# Patient Record
Sex: Male | Born: 1940 | Race: White | Hispanic: No | Marital: Married | Smoking: Former smoker
Health system: Southern US, Community
[De-identification: ages and names within clinical notes are randomized; demographics above are authoritative.]

## PROBLEM LIST (undated history)

## (undated) DIAGNOSIS — R413 Other amnesia: Secondary | ICD-10-CM

## (undated) DIAGNOSIS — I639 Cerebral infarction, unspecified: Secondary | ICD-10-CM

## (undated) DIAGNOSIS — F0390 Unspecified dementia without behavioral disturbance: Secondary | ICD-10-CM

## (undated) DIAGNOSIS — K219 Gastro-esophageal reflux disease without esophagitis: Secondary | ICD-10-CM

## (undated) DIAGNOSIS — I10 Essential (primary) hypertension: Secondary | ICD-10-CM

## (undated) DIAGNOSIS — M549 Dorsalgia, unspecified: Secondary | ICD-10-CM

## (undated) DIAGNOSIS — E291 Testicular hypofunction: Secondary | ICD-10-CM

## (undated) DIAGNOSIS — J449 Chronic obstructive pulmonary disease, unspecified: Secondary | ICD-10-CM

## (undated) DIAGNOSIS — E785 Hyperlipidemia, unspecified: Secondary | ICD-10-CM

## (undated) DIAGNOSIS — N189 Chronic kidney disease, unspecified: Secondary | ICD-10-CM

## (undated) DIAGNOSIS — Z720 Tobacco use: Secondary | ICD-10-CM

## (undated) HISTORY — DX: Gastro-esophageal reflux disease without esophagitis: K21.9

## (undated) HISTORY — PX: CARPAL TUNNEL RELEASE: SHX101

## (undated) HISTORY — DX: Testicular hypofunction: E29.1

## (undated) HISTORY — PX: OTHER SURGICAL HISTORY: SHX169

## (undated) HISTORY — DX: Other amnesia: R41.3

## (undated) HISTORY — DX: Essential (primary) hypertension: I10

## (undated) HISTORY — DX: Hyperlipidemia, unspecified: E78.5

## (undated) HISTORY — PX: BACK SURGERY: SHX140

## (undated) HISTORY — DX: Dorsalgia, unspecified: M54.9

## (undated) HISTORY — DX: Chronic kidney disease, unspecified: N18.9

---

## 2007-01-20 ENCOUNTER — Ambulatory Visit: Payer: Self-pay | Admitting: Orthopedic Surgery

## 2007-02-03 ENCOUNTER — Ambulatory Visit (HOSPITAL_COMMUNITY): Admission: RE | Admit: 2007-02-03 | Discharge: 2007-02-03 | Payer: Self-pay | Admitting: *Deleted

## 2007-03-15 ENCOUNTER — Encounter: Payer: Self-pay | Admitting: Orthopedic Surgery

## 2007-08-23 ENCOUNTER — Ambulatory Visit: Payer: Self-pay | Admitting: Orthopedic Surgery

## 2008-01-18 ENCOUNTER — Ambulatory Visit (HOSPITAL_COMMUNITY): Admission: RE | Admit: 2008-01-18 | Discharge: 2008-01-18 | Payer: Self-pay | Admitting: Internal Medicine

## 2008-02-16 ENCOUNTER — Ambulatory Visit (HOSPITAL_COMMUNITY): Admission: RE | Admit: 2008-02-16 | Discharge: 2008-02-16 | Payer: Self-pay | Admitting: Family Medicine

## 2008-03-14 ENCOUNTER — Ambulatory Visit: Payer: Self-pay | Admitting: Orthopedic Surgery

## 2008-03-14 DIAGNOSIS — M19019 Primary osteoarthritis, unspecified shoulder: Secondary | ICD-10-CM | POA: Insufficient documentation

## 2008-03-14 DIAGNOSIS — M7580 Other shoulder lesions, unspecified shoulder: Secondary | ICD-10-CM

## 2008-03-14 DIAGNOSIS — M75120 Complete rotator cuff tear or rupture of unspecified shoulder, not specified as traumatic: Secondary | ICD-10-CM

## 2008-03-14 DIAGNOSIS — M25519 Pain in unspecified shoulder: Secondary | ICD-10-CM | POA: Insufficient documentation

## 2008-07-16 ENCOUNTER — Ambulatory Visit: Payer: Self-pay | Admitting: Orthopedic Surgery

## 2008-08-15 ENCOUNTER — Ambulatory Visit (HOSPITAL_COMMUNITY): Admission: RE | Admit: 2008-08-15 | Discharge: 2008-08-15 | Payer: Self-pay | Admitting: General Surgery

## 2008-08-15 ENCOUNTER — Encounter (INDEPENDENT_AMBULATORY_CARE_PROVIDER_SITE_OTHER): Payer: Self-pay | Admitting: General Surgery

## 2009-03-04 ENCOUNTER — Ambulatory Visit (HOSPITAL_COMMUNITY): Admission: RE | Admit: 2009-03-04 | Discharge: 2009-03-04 | Payer: Self-pay | Admitting: Internal Medicine

## 2009-03-07 ENCOUNTER — Encounter (INDEPENDENT_AMBULATORY_CARE_PROVIDER_SITE_OTHER): Payer: Self-pay | Admitting: *Deleted

## 2009-04-08 ENCOUNTER — Encounter: Payer: Self-pay | Admitting: Gastroenterology

## 2009-05-14 ENCOUNTER — Ambulatory Visit: Payer: Self-pay | Admitting: Internal Medicine

## 2009-05-14 DIAGNOSIS — R198 Other specified symptoms and signs involving the digestive system and abdomen: Secondary | ICD-10-CM

## 2009-05-14 DIAGNOSIS — Z860100 Personal history of colon polyps, unspecified: Secondary | ICD-10-CM | POA: Insufficient documentation

## 2009-05-14 DIAGNOSIS — K5900 Constipation, unspecified: Secondary | ICD-10-CM | POA: Insufficient documentation

## 2009-05-15 ENCOUNTER — Encounter: Payer: Self-pay | Admitting: Internal Medicine

## 2009-05-20 ENCOUNTER — Encounter: Payer: Self-pay | Admitting: Internal Medicine

## 2009-06-07 ENCOUNTER — Ambulatory Visit: Payer: Self-pay | Admitting: Internal Medicine

## 2009-06-07 ENCOUNTER — Ambulatory Visit (HOSPITAL_COMMUNITY): Admission: RE | Admit: 2009-06-07 | Discharge: 2009-06-07 | Payer: Self-pay | Admitting: Internal Medicine

## 2009-09-10 ENCOUNTER — Ambulatory Visit (HOSPITAL_COMMUNITY): Admission: RE | Admit: 2009-09-10 | Discharge: 2009-09-10 | Payer: Self-pay | Admitting: Neurosurgery

## 2011-01-15 ENCOUNTER — Ambulatory Visit (HOSPITAL_COMMUNITY)
Admission: RE | Admit: 2011-01-15 | Discharge: 2011-01-15 | Payer: Self-pay | Source: Home / Self Care | Attending: Neurosurgery | Admitting: Neurosurgery

## 2011-05-12 NOTE — H&P (Signed)
NAMECRECENCIO, CUPPETT               ACCOUNT NO.:  [id number]   MEDICAL RECORD NO.:  [id number]          PATIENT TYPE:  AMB   LOCATION:  DAY                           FACILITY:  [facility]   PHYSICIAN:  Chelsea Primus, MD      DATE OF BIRTH:  Dec 28, 1941   DATE OF ADMISSION:  DATE OF DISCHARGE:  LH                              HISTORY & PHYSICAL   CHIEF COMPLAINT:  Lump on back.   HISTORY OF PRESENT ILLNESS:  The patient is a 70 year old male with a  history of a nodule on his back.  He states this had been previously  operated on in the past as an outpatient procedure in an office visit at  another surgeon's office.  From the description, it sounds like this was  incised and drained at that time, although the patient does state he was  told this was a definitive care treatment for this.  Since that time, he  has noted a recurrence of it, slowly increasing in size.  He has had no  episodes of pain.  No discharge.  No overlying erythema.  No fever or  chills.  No similar nodularities anywhere else on his body.   PAST MEDICAL HISTORY:  1. Hypertension.  2. Hypercholesterolemia.   PAST SURGICAL HISTORY:  He has had cervical neck surgery and lower back  surgery.   MEDICATIONS:  1. Lisinopril.  2. Hydrochlorothiazide.  3. Aspirin.  4. Simvastatin.   ALLERGIES:  No known drug allergies.   SOCIAL HISTORY:  He is a 1.5 pack per day smoker.  Alcohol 1-2 drinks  per week.  No recreational drug use.  Occupation, he is retired.   PERTINENT FAMILY HISTORY:  Noncontributory.   REVIEW OF SYSTEMS:  CONSTITUTIONAL:  Unremarkable.  EYES:  Unremarkable.  EARS, NOSE, AND THROAT:  Unremarkable.  RESPIRATORY:  Occasional  shortness of breath and wheezing.  CARDIOVASCULAR:  Unremarkable.  GASTROINTESTINAL:  Abdominal pain and heartburn.  GENITOURINARY:  Unremarkable.  MUSCULOSKELETAL:  Arthralgias of the joint.  SKIN:  Unremarkable.  NEURO:  Unremarkable.  ENDOCRINE:  Unremarkable.   PHYSICAL  EXAMINATION:  GENERAL:  The patient is healthy, calm in  appearance.  HEENT:  Scalp, no deformities, no masses.  Eyes:  Pupils are equal,  round, reactive.  Extraocular movements are intact.  No conjunctival  pallor is noted.  Oral mucosa is pink.  Normal occlusion.  NECK:  Trachea is midline.  No cervical lymphadenopathy is apparent.  PULMONARY:  Unlabored respiration.  No wheezes on evaluation today.  No  crackles.  He is clear to auscultation bilaterally.  CARDIOVASCULAR:  Regular rate and rhythm.  There is 2+ radial pulses  bilaterally.  ABDOMEN:  Soft, positive bowel sounds, nontender.  SKIN:  Warm and dry.  On evaluation of his back, he does have a mobile,  soft, nontender approximate 2-cm nodule on the midportion of his back.  There is an overlying scar consistent with the patient's description of  his previous procedure.   ASSESSMENT/PLAN:  Sebaceous cyst of the back.  At this point, I did  discuss with the patient  the pathophysiology of cysts and requirement to  remove the entire cyst.  I discussed with the patient the need to do  this in the operating room.  The risks, benefits, and alternatives that  consist excision were discussed at length with the patient including,  but not limited to the risk of bleeding, infection, or recurrence.  The  patient's questions and concerns were addressed and the patient will be  consented for the planned procedure.      Chelsea Primus, MD  Electronically Signed     BZ/MEDQ  D:  07/31/2008  T:  08/01/2008  Job:  [id number]   cc:   Sherrilee Gilles. Gerarda Fraction, MD  Fax: [facility]  Fax: [phone]

## 2011-05-12 NOTE — Op Note (Signed)
Glenn [name], Glenn [name]               ACCOUNT NO.:  [id number]   MEDICAL RECORD NO.:  [id number]          PATIENT TYPE:  AMB   LOCATION:  DAY                           FACILITY:  [facility]   PHYSICIAN:  Chelsea Primus, MD      DATE OF BIRTH:  1941/04/19   DATE OF PROCEDURE:  08/15/2008  DATE OF DISCHARGE:                               OPERATIVE REPORT   PREOPERATIVE DIAGNOSIS:  Sebaceous cyst of the back.   POSTOPERATIVE DIAGNOSIS:  Sebaceous cyst of the back.   PROCEDURE:  Excision of sebaceous cyst of the back via 2-cm incision.   SURGEON:  Chelsea Primus, MD   ANESTHESIA:  IV sedation MAC with local anesthetic 1% Sensorcaine with  epinephrine.   SPECIMEN:  Cyst.   ESTIMATED BLOOD LOSS:  Scant.   INDICATIONS:  The patient is a 70 year old male who presents today to my  office as an outpatient with notable nodule on his back.  He states this  had been previously operated on.  From his description, it found like he  had had a previous incision and drainage of the cyst.  At this time, he  still has some local discomfort and occasional pressure in the area.  He  had no episodes of recent infection associated with it.  Risks,  benefits, and alternatives of excision were discussed at length with the  patient including but not limited to risk of bleeding, infection, or  recurrence.  The patient's questions and concerns were addressed and the  patient wished to proceed with excision of the cyst.   OPERATION:  The patient was taken to the operating room, he was placed  in the left lateral decubitus position on a beanbag support, and was  given the MAC sedation.  Once the patient was asleep, his back was  prepped with DuraPrep solution and drapes were placed in a standard  fashion.  Local anesthetic was instilled.  An elliptical incision was  created over the cyst and this was dissected down.  The subcutaneous  tissue was carried out using a needle-tipped electrocautery.  At this  point, the  dissection was carried out down to the underlying fascia.  At  which time, the cyst was undermined and was removed from the surrounding  tissue.  It was placed on the back table and was sent as a permanent  specimen to pathology.  Hemostasis was obtained using electrocautery.  The wound was irrigated.  Deep submucosal tissue was reapproximated  using a 3-0 Vicryl in a deep interrupted fashion and a 3-0 Prolene was  utilized to reapproximate the skin edges.  In a similar fashion, the  skin was washed and dried with moist and dry towel.  Sterile 4x4  dressing was placed and Tegaderm dressing with Xeroform placed.  The  drapes were  removed and the patient was allowed to come out of sedation and was  transferred back to regular hospital bed and transferred to  [facility] in stable condition.  At the conclusion of  procedure, all instruments, sponge, and needle counts were correct.  The  patient tolerated  the procedure well.      Chelsea Primus, MD  Electronically Signed     BZ/MEDQ  D:  08/15/2008  T:  08/16/2008  Job:  [id number]

## 2011-05-12 NOTE — Op Note (Signed)
Glenn [name], Glenn [name]               ACCOUNT NO.:  [id number]   MEDICAL RECORD NO.:  [id number]          PATIENT TYPE:  AMB   LOCATION:  DAY                           FACILITY:  [facility]   PHYSICIAN:  R. Garfield Cornea, M.D. DATE OF BIRTH:  01/10/1941   DATE OF PROCEDURE:  06/07/2009  DATE OF DISCHARGE:                               OPERATIVE REPORT   PROCEDURE PERFORMED:  Surveillance colonoscopy.   INDICATIONS FOR PROCEDURE:  A 70 year old gentleman who reportedly had  numerous colonic polyps removed from his colon at [facility] 5 years  ago.  He is here for surveillance.  His only GI symptom is constipation.  There was sketchy history of colitis when he was referred but he  adamantly denies any history of inflammatory bowel disease or colitis.  He does not have diarrhea and does not have any blood per rectum.  No  abdominal pain.  Colonoscopy is now being done as a surveillance  maneuver.  Risks, benefits, alternatives and limitations have been  reviewed, questions answered.  Please see documentation in the medical  record.   PROCEDURE NOTE:  O2 saturation, blood pressure, pulse and respirations  were monitored throughout the entire procedure.  Conscious sedation was  Versed 3 mg IV, Demerol 50 mg IV in divided doses.   INSTRUMENT USED:  Pentax video chip system.   FINDINGS:  Digital rectal exam revealed no abnormalities.  The prep was  marginal.  Colon:  Colonic mucosa was surveyed from the rectosigmoid junction  through the left, transverse and right colon into the area of  appendiceal orifice, ileocecal valve and cecum.  These structures were  well seen and photographed for the record.  From this level, scope was  slowly cautiously withdrawn.  Previously mentioned mucosal surfaces were  again seen.  There was granular stool and vegetable material throughout  the colon which had to be washed and suctioned out to gain a better  view.  The patient was noted to have shallow  scattered left-sided  diverticula.  Remainder of colonic mucosa appeared normal.  Scope was  pulled down in the rectum where thorough examination of rectal mucosa  including retroflex view of the anal verge demonstrated no  abnormalities.  The patient tolerated procedure well.  Cecal withdrawal  time 8 minutes.   IMPRESSION:  Normal rectum, few shallow left-sided diverticula.  Remainder of colonic mucosa appeared normal.   RECOMMENDATIONS:  1. Diverticulosis literature provided to [name].  2. Recommend repeat colonoscopy in 5 years.      Bridgette Habermann, M.D.  Electronically Signed     RMR/MEDQ  D:  06/07/2009  T:  06/07/2009  Job:  [id number]   cc:   Sherrilee Gilles. Gerarda Fraction, MD  Fax: [phone]

## 2011-09-15 ENCOUNTER — Other Ambulatory Visit: Payer: Self-pay | Admitting: Neurology

## 2011-09-15 DIAGNOSIS — R410 Disorientation, unspecified: Secondary | ICD-10-CM

## 2011-09-18 ENCOUNTER — Ambulatory Visit (HOSPITAL_COMMUNITY): Payer: Medicare Other

## 2011-09-18 ENCOUNTER — Ambulatory Visit (HOSPITAL_COMMUNITY)
Admission: RE | Admit: 2011-09-18 | Discharge: 2011-09-18 | Disposition: A | Discharge status: Home / Self Care | Payer: Medicare Other | Source: Ambulatory Visit | Attending: Neurology | Admitting: Neurology

## 2011-09-18 DIAGNOSIS — R410 Disorientation, unspecified: Secondary | ICD-10-CM

## 2011-09-18 DIAGNOSIS — R4182 Altered mental status, unspecified: Secondary | ICD-10-CM | POA: Insufficient documentation

## 2011-10-16 ENCOUNTER — Ambulatory Visit: Payer: Medicare Other | Attending: Neurology | Admitting: Sleep Medicine

## 2011-10-16 DIAGNOSIS — G4730 Sleep apnea, unspecified: Secondary | ICD-10-CM

## 2011-10-16 DIAGNOSIS — Z681 Body mass index (BMI) 19 or less, adult: Secondary | ICD-10-CM | POA: Insufficient documentation

## 2011-10-16 DIAGNOSIS — G4733 Obstructive sleep apnea (adult) (pediatric): Secondary | ICD-10-CM | POA: Insufficient documentation

## 2011-10-16 DIAGNOSIS — G4710 Hypersomnia, unspecified: Secondary | ICD-10-CM | POA: Insufficient documentation

## 2011-10-18 NOTE — Procedures (Signed)
NAMEEBRAHIM, Glenn [name]               ACCOUNT NO.:  [id number]  MEDICAL RECORD NO.:  [id number]          PATIENT TYPE:  OUT  LOCATION:  SLEEP LAB                     FACILITY:  [facility]  PHYSICIAN:  Jaeson Molstad A. Merlene Laughter, M.D. DATE OF BIRTH:  11-30-1941  DATE OF STUDY:  10/16/2011                           NOCTURNAL POLYSOMNOGRAM  REFERRING PHYSICIAN:  Paige Monarrez A. Merlene Laughter, M.D.  INDICATIONS:  This 70 year old man who presents with hypersomnia, fatigue, snoring and insomnia.  The study is being done to evaluate for obstructive sleep apnea syndrome.  INDICATION FOR STUDY:  EPWORTH SLEEPINESS SCORE:  MEDICATIONS:  Claritin, Ambien, hydrocodone, hydrochlorothiazide, Nexium, lisinopril, aspirin, simvastatin and Depakote.  EPWORTH SLEEPINESS SCALE:  12.  BMI:  18.  ARCHITECTURAL SUMMARY:  The total recording time is 399 minutes.  Sleep efficiency 69%, sleep latency 46 minutes and REM latency quite early at 28 minutes.  Stage N1 14%, N2 63%, N3 30% and REM sleep 10%.  RISK FACTORS:  Baseline oxygen saturation is 95, lowest saturation 88 during REM sleep.  Diagnostic AHI is 11 and RDI 12.  LIMB MOVEMENT SUMMARY:  PLM index is recorded as 3.  However, there is quite severe phasic EMG activity/fragmentary myoclonus noted throughout the recording, including REM sleep.  ELECTROCARDIOGRAM SUMMARY:  Average heart rate is 87 with no significant dysrhythmias observed.  IMPRESSION: 1. Mild obstructive sleep apnea syndrome. 2. Severe phasic EMG activity/fragmentary myoclonus activity.  This is     observed even in REM sleep suggestive of REM sleep behavior     disorder 3. 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    Lesieli Bresee A. Merlene Laughter, M.D.    KAD/MEDQ  D:  10/17/2011 [id number]  T:  10/18/2011 03:06:52  Job:  [id number]

## 2011-11-23 ENCOUNTER — Other Ambulatory Visit (HOSPITAL_COMMUNITY): Payer: Self-pay | Admitting: Nephrology

## 2011-11-23 DIAGNOSIS — N289 Disorder of kidney and ureter, unspecified: Secondary | ICD-10-CM

## 2011-11-26 ENCOUNTER — Ambulatory Visit (HOSPITAL_COMMUNITY)
Admission: RE | Admit: 2011-11-26 | Discharge: 2011-11-26 | Disposition: A | Discharge status: Home / Self Care | Payer: Medicare Other | Source: Ambulatory Visit | Attending: Nephrology | Admitting: Nephrology

## 2011-11-26 DIAGNOSIS — N289 Disorder of kidney and ureter, unspecified: Secondary | ICD-10-CM | POA: Insufficient documentation

## 2011-11-26 DIAGNOSIS — Q619 Cystic kidney disease, unspecified: Secondary | ICD-10-CM | POA: Insufficient documentation

## 2011-11-27 ENCOUNTER — Ambulatory Visit (HOSPITAL_COMMUNITY)
Admission: RE | Admit: 2011-11-27 | Discharge: 2011-11-27 | Disposition: A | Discharge status: Home / Self Care | Payer: Medicare Other | Source: Ambulatory Visit | Attending: Internal Medicine | Admitting: Internal Medicine

## 2011-11-27 ENCOUNTER — Other Ambulatory Visit (HOSPITAL_COMMUNITY): Payer: Self-pay | Admitting: Internal Medicine

## 2011-11-27 DIAGNOSIS — R05 Cough: Secondary | ICD-10-CM

## 2011-11-27 DIAGNOSIS — R059 Cough, unspecified: Secondary | ICD-10-CM | POA: Insufficient documentation

## 2011-11-30 DIAGNOSIS — R413 Other amnesia: Secondary | ICD-10-CM | POA: Diagnosis present

## 2012-01-25 DIAGNOSIS — I10 Essential (primary) hypertension: Secondary | ICD-10-CM | POA: Diagnosis not present

## 2012-01-25 DIAGNOSIS — Z79899 Other long term (current) drug therapy: Secondary | ICD-10-CM | POA: Diagnosis not present

## 2012-01-25 DIAGNOSIS — R413 Other amnesia: Secondary | ICD-10-CM | POA: Diagnosis not present

## 2012-01-26 ENCOUNTER — Encounter (HOSPITAL_COMMUNITY): Payer: Self-pay | Admitting: Psychiatry

## 2012-01-26 ENCOUNTER — Ambulatory Visit (INDEPENDENT_AMBULATORY_CARE_PROVIDER_SITE_OTHER): Payer: Medicare Other | Admitting: Psychiatry

## 2012-01-26 VITALS — Wt 158.0 lb

## 2012-01-26 DIAGNOSIS — F39 Unspecified mood [affective] disorder: Secondary | ICD-10-CM

## 2012-01-26 DIAGNOSIS — F0630 Mood disorder due to known physiological condition, unspecified: Secondary | ICD-10-CM

## 2012-01-26 NOTE — Progress Notes (Signed)
Chief complaint I have memory issues  History of presenting illness Patient is 71 year old married retired male who came with his wife for the appointment. 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 He was referred from neurologist Dr. Paulita Cradle for evaluation and treatment. Apparently patient endorsed suicidal thinking and ideation in August last year due to the fact that he cannot tolerate his wife's sister in his house. Patient told his wife sister decided to move in with them and his wife decided to help her out. She stayed almost 4 months and patient reported those 4 months were very difficult for him. He could not tolerate her behavior and relationship with their children. Since she moved out patient reported he is doing better. However patient's wife reported that patient has been more irritable angry frustrated and having mood swing earlier than that. Patient wife endorse that patient has significant personality changes with agitation forgetting things with angry episodes. Patient endorse suicidal thinking her primary care physician recommended to see neurologist to see if patient is suffering from neurological disease. Patient has extensive workup including EEG CT scan and MRI of brain. He was ruled out for Huntington's disease. As per patient's wife imaging study shows old stroke which happened in 2002. Even though patient's wife's sister moved out patient continues to have moments of frustration anger and agitation. He tends to forget things. During the conversation patient and his wife continue to argue about the events which patient has been forgetting recently. Patient and his wife also marital issues and recently thinks seriously to get some marital counseling. Patient also remember forgetting directions telephone numbers recently. Patient also endorse at times poor sleep and racing thoughts. Patient denies any active or even passive suicidal thoughts in recent months. He denies any paranoia or  violent episode. He does admit sometimes seeing shadows at the corner of his eyes but denies any hallucination. He admitted getting easily frustrated when things are not done or if he does not remember very well. Recently neurologist started him on Depakote which was gradually increased to 1000 mg. As per wife and patient increased Depakote is helping to control his mood sleep and anger. Patient did not bring the list of medication today.  Past psychiatric history Patient denies any history of previous psychiatric inpatient treatment or any suicidal attempt. He denies any history of paranoid thinking delusions or violent behavior. He do not remember taking any psychotropic medication other than Ambien which is given by his primary care physician and he's been taking for past 3 years.  Medical history Patient has history of hypertension. His primary care physician is Dr. Gerarda Fraction. Recently he has see neurologist an extensive workup done including B12 level.  Psychosocial history Patient is born and raised in [place]. This is his third marriage. His first marriage lasted for 5 years. He has 2 sons from his first marriage however patient has no contact with his first wife and children. Patient's second marriage last only for one year. This is his third marriage and patient has 3 children from his third wife. Patient is eating-year-old son and 50 year old son and 82 year old son. Patient denies any history of sexual verbal or emotional abuse.  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  Family history She denies any family history of psychiatric illness.  Educational background Patient has high school education.  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 He admitted having DWI in in past. He also endorse history of blackout due to heavy drinking. He denies any recent use of alcohol. He  denies any history of illegal substances.  Current medication Patient do not have the details of his medication.  Mental status examination Patient is elderly man who is casually dressed and fairly groomed. He appears very restless and anxious. He keep changing his posture. He maintained fair eye contact. He is cooperative but at times irrelevant in conversation. His attention and concentration was distracted. His speech is at times rambling and incoherent. He denies any active or passive suicidal thoughts or homicidal thoughts. There no psychotic symptoms present. There no delusions or paranoid thinking present. Denies any auditory or visual hallucination. He has difficulty recalling recent events. His thought processes circumstantial. There were no tremors however he was restless during the conversation. He was alert and oriented x3 but he missed to things in his serial 7. His insi. His i insight judgment and impulse control is fair  Diagnoses Axis I mood disorder NOS rule out mood disorder due to general medical condition Axis II deferred Axis III see medical history Axis IV moderate Axis V 65-70  Plan At this time patient is doing better with Depakote. He is reporting no side effects of medication. He is sleeping better and his behavior is much control. I do believe patient needs psychological testing to help the diagnosis of his memory changes which so for not resolved by extensive neurology workup. I will also need his current medication and collateral information from his neurologist. I recommended to see psychologist for neuropsych testing and also for individual counseling to increase his coping and social skills. I explained the risks and benefits of Depakote including metabolic side effects. We also talked about safety plan that in case patient feel worsening of her symptoms or patient or his wife feels patient having any suicidal thoughts and homicidal thoughts and he need to call 911 or  go to local ER. I will see him again in 3 weeks. No prescription given on this visit. Patient will make appointment to see psychologist

## 2012-01-29 DIAGNOSIS — I10 Essential (primary) hypertension: Secondary | ICD-10-CM | POA: Diagnosis not present

## 2012-01-29 DIAGNOSIS — J449 Chronic obstructive pulmonary disease, unspecified: Secondary | ICD-10-CM | POA: Diagnosis not present

## 2012-02-09 ENCOUNTER — Ambulatory Visit (INDEPENDENT_AMBULATORY_CARE_PROVIDER_SITE_OTHER): Payer: Medicare Other | Admitting: Psychology

## 2012-02-09 DIAGNOSIS — F0630 Mood disorder due to known physiological condition, unspecified: Secondary | ICD-10-CM

## 2012-02-09 DIAGNOSIS — R413 Other amnesia: Secondary | ICD-10-CM

## 2012-02-16 ENCOUNTER — Ambulatory Visit (INDEPENDENT_AMBULATORY_CARE_PROVIDER_SITE_OTHER): Payer: Medicare Other | Admitting: Psychiatry

## 2012-02-16 ENCOUNTER — Encounter (HOSPITAL_COMMUNITY): Payer: Self-pay | Admitting: Psychiatry

## 2012-02-16 DIAGNOSIS — Z79899 Other long term (current) drug therapy: Secondary | ICD-10-CM | POA: Diagnosis not present

## 2012-02-16 DIAGNOSIS — F39 Unspecified mood [affective] disorder: Secondary | ICD-10-CM

## 2012-02-16 NOTE — Progress Notes (Signed)
Chief complaint I am doing better  History of presenting illness Patient is 71 year old married retired male who came with his wife for the followup appointment. Patient is also seen by psychologist in this office and psychological testing is schedule next week. As per wife patient has been doing somewhat better in his mood. He is sleeping better. He denies any agitation anger or severe mood swings however he continues to have episodes of confusion and frustration. He is taking Depakote thousand milligram at bedtime. Patient reported that he has sometime tremors and shakes but no other concerns or side effects. He is sleeping 6-7 hours. He denies any panic attack or severe anger.  I review his medication however these are recently changed from his primary care physician. He is still take Ambien 10 mg every night. He has no recent Depakote level drawn.   Past psychiatric history Patient denies any history of previous psychiatric inpatient treatment or any suicidal attempt. He denies any history of paranoid thinking delusions or violent behavior. He do not remember taking any psychotropic medication other than Ambien which is given by his primary care physician and he's been taking for past 3 years.  Medical history Patient has history of hypertension. His primary care physician is Dr. Gerarda Fraction. Recently he has see neurologist an extensive workup done including B12 level.  Psychosocial history Patient is born and raised in [place]. This is his third marriage. His first marriage lasted for 5 years. He has 2 sons from his first marriage however patient has no contact with his first wife and children. Patient's second marriage last only for one year. This is his third marriage and patient has 3 children from his third wife. Patient is eating-year-old son and 53 year old son and 77 year old son. Patient denies any history of sexual verbal or emotional abuse.  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  Family history She denies any family history of psychiatric illness.  Educational background Patient has high school education.  Alcohol and substance use history Patient endorsed history of heavy alcoholism when he was in [employer]. He admitted having DWI in in past. He also endorse history of blackout due to heavy drinking. He denies any recent use of alcohol. He denies any history of illegal substances.  Current medication Patient do not have the details of his medication.  Mental status examination Patient is elderly man who is casually dressed and fairly groomed. He appears much calmer and cooperative. His speech is fast but relevant. His attention and concentration remains distracted. He denies any active or passive suicidal thinking and homicidal thinking. He denies any auditory or visual hallucination. There were no delusion or paranoid thinking present. His thought processes circumstantial. There are fine tremors present. He keep changing his posture during the conversation. He's alert and oriented x3. His insight judgment and pulse control is fair   Diagnoses Axis I mood disorder NOS rule out mood disorder due to general medical condition Axis II deferred Axis III see medical history Axis IV moderate Axis V 65-70  Plan I talked to the patient and his wife in detail. I will consider getting Depakote level along with CBC CMP and hemoglobin A1c. His tremor could be the possibility of Depakote. I also recommended to take half Ambien at bedtime. For now he will continue Depakote thousand milligram. I explained risks and benefits of medication. He will continue to see psychologist and we will follow up on psychological  testing. I will see him in 4 weeks. Time spent 30 minutes

## 2012-02-17 DIAGNOSIS — Z79899 Other long term (current) drug therapy: Secondary | ICD-10-CM | POA: Diagnosis not present

## 2012-02-18 LAB — CBC WITH DIFFERENTIAL/PLATELET
Basophils Relative: 0 % (ref 0–1)
Eosinophils Relative: 2 % (ref 0–5)
HCT: 44.4 % (ref 39.0–52.0)
Hemoglobin: 14.6 g/dL (ref 13.0–17.0)
Lymphs Abs: 2.6 10*3/uL (ref 0.7–4.0)
MCH: 30.2 pg (ref 26.0–34.0)
MCV: 91.9 fL (ref 78.0–100.0)
Monocytes Absolute: 1.6 10*3/uL — ABNORMAL HIGH (ref 0.1–1.0)
Neutro Abs: 8.7 10*3/uL — ABNORMAL HIGH (ref 1.7–7.7)
Neutrophils Relative %: 66 % (ref 43–77)
RBC: 4.83 MIL/uL (ref 4.22–5.81)
RDW: 13.6 % (ref 11.5–15.5)

## 2012-02-18 LAB — COMPREHENSIVE METABOLIC PANEL
AST: 20 U/L (ref 0–37)
Albumin: 3.8 g/dL (ref 3.5–5.2)
Calcium: 9 mg/dL (ref 8.4–10.5)
Chloride: 104 mEq/L (ref 96–112)
Creat: 1.14 mg/dL (ref 0.50–1.35)
Potassium: 4.3 mEq/L (ref 3.5–5.3)

## 2012-02-18 LAB — HEMOGLOBIN A1C
Hgb A1c MFr Bld: 5.8 % — ABNORMAL HIGH (ref <5.7)
Mean Plasma Glucose: 120 mg/dL — ABNORMAL HIGH (ref <117)

## 2012-02-18 LAB — VALPROIC ACID LEVEL: Valproic Acid Lvl: 27.8 ug/mL — ABNORMAL LOW (ref 50.0–100.0)

## 2012-02-23 ENCOUNTER — Encounter (HOSPITAL_COMMUNITY): Payer: Self-pay | Admitting: Psychology

## 2012-02-23 NOTE — Progress Notes (Signed)
Patient:   Glenn [name]   DOB:   04/12/1941  MR Number:  [id number]  Location:  [facility] [address] [place] [address] Dept: [phone]           Date of Service:   02/09/2012  Start Time:   9:30 AM End Time:   10:30 AM  Provider/Observer:  Edgardo Roys PSYD       Billing Code/Service: [phone]  Chief Complaint:     Chief Complaint  Patient presents with  . Anxiety  . Depression  . Memory Loss    Reason for Service:  The patient was referred by his family members because of increasing concerns about the possibility of Alzheimer's or other lytic issues related to memory loss. The patient is in a general denial about the severity of the symptoms. The patient doesn't knowledge that he forgets a few things here and there and may have gotten turned around a few times and gotten lost driving but overall the patient reports he is doing well. However, the patient's wife reports that there are significant problems. In 2002 he had a stroke. The patient port said he woke up and felt numbness in his right arm. He was diagnosed with a transient ischemic attack. There is a strong family history of TIAs and high blood pressure the patient presented at the [facility] and they ran some tests and gave him some medications but did not feel that he had a major stroke. Since that time any of these symptoms have cleared up and he has had no apparent symptoms from this problem. However, the patient's wife reports that this past summer the patient would get very angry over the smallest of things. This anger and frustration also included suicidal ideation which is why he originally presented that the neurologist. The patient has an 71 year old, 71 year old, an 19 year old child. The patient has been more agitated and sharp with them and there are other times where he is described as having geographic  disorientation. The patient forgets what he is going to say and then experiences or displays other expressive language problems including circumlocutions, paraphrasic errors, as well as significant indications of combat he would show an without the appearance of knowledge of the inaccuracies of his statements.  Current Status:  The patient does present as a 71 year old married retired male who had been referred by his neurologist and initially saw Dr. Adele Schilder.  There've been some progressive deterioration in the patient's functioning. He has been displaying increased agitation anger and had a great deal of conflict with the patient's wife's sister who moved in and lived with them for a few months. The patient began having increasing conflicts and began developing suicidal ideation. Extensive neurological workup including EEG, CT scan, and MRI have been conducted and there's been a rule out of Huntington's disease (which there is a family history of) but that this imaging did show evidence of an old stroke that happened back in 2002. No other strokes or indications of strokes were noticed. The patient has been showing increasing expressive language difficulties, memory difficulties, geographic disorientation, and a progressive decline in overall functioning. The patient has also had episodes of what appear to be confabulations without apparent knowledge or understanding that these confabulations are in fact not true.  Reliability of Information: The information is provided by the patient as well as his wife who is also present for the clinical interview.  Behavioral Observation:  Glenn [name]  presents as a 71 y.o.-year-old Right Caucasian Male who appeared his stated age. his dress was Appropriate and he was Well Groomed and his manners were Appropriate to the situation.  There were not any physical disabilities noted.  he displayed an appropriate level of cooperation and motivation.    Interactions:        Active   Attention:   Indications of being easily distracted  Memory:   There were indications of memory difficulties particularly for recent information but no indication of long-term memory disturbance.   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  The patient doesn't knowledge heavy alcohol use when he was in the [employer]. He had a DWI in the past. He has also had a blackout 2 to heavy drinking. He denies any recent use of alcohol and denies any history of illegal substances.  Education:   The patient has a high school education  Medical History:   Past Medical History  Diagnosis Date  . HTN (hypertension)   . Memory changes         Outpatient Encounter Prescriptions as of 02/24/2012  Medication Sig Dispense Refill  . aspirin 81 MG tablet Take 81 mg by mouth daily.        . divalproex (DEPAKOTE ER) 500 MG 24 hr tablet Take 1,000 mg by mouth daily. Dr Ollen Barges      . esomeprazole (NEXIUM) 40 MG capsule Take 40 mg by mouth daily before breakfast.        . hydrochlorothiazide (HYDRODIURIL) 25 MG tablet Take 25 mg by mouth daily.        Marland Kitchen lisinopril (PRINIVIL,ZESTRIL) 40 MG tablet Take 40 mg by mouth daily. 1/2 tab       . loratadine (CLARITIN) 10 MG tablet Take 10 mg by mouth daily.        . simvastatin (ZOCOR) 80 MG tablet Take 80 mg by mouth at bedtime. 1/2 tab daily       . zolpidem (AMBIEN) 10 MG tablet Take 10 mg by mouth at bedtime as needed.                Sexual History:   History  Sexual  Activity  . Sexually Active: Not on file    Abuse/Trauma History: There is no indication of sexual abuse or other traumas.  Psychiatric History:  The patient denies any history of previous psychiatric inpatient hospitalization or other suicidal ideation or intent. He denies any history of paranoid or delusional thinking or violent behavior. He denies any history of psychotropic medications other than taking sleep medications included Ambien that were prescribed by his primary care physician and he has been taking this for the past 3 years.  Family Med/Psych History: No family history on file.  Risk of Suicide/Violence: moderate the patient did initially present to his primary care physician with thoughts of suicide and extreme anger. It was these presentations that led  him to our office in the first place. He denies any current intent or plan and denies any active suicidal ideation.  Impression/DX:  At this point, the patient does have a neurological history of a stroke back in 2002 this confirmed both by reports of symptoms at the time as well as a recent MRI of the brain. However, there was no indication of any successive strokes. The patient has shown significant improvement from the initial symptoms that developed after 2002. The presentation of changes does not appear to be stepwise but in fact a progressive deterioration. Geographic disorientation, short term and recent memory weaknesses, problems with expressive line which including circumlocutions and paraphrasic errors are noted. The patient and his family also reportedly still few instances of geographic disorientation. The possibility of an underlying progressive cortical dementia such as Alzheimer's remains present and we will conduct formal neuropsychological testing to objectively assess and document current cognitive and memory functions as well as establish a baseline for future comparisons.  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 Memory loss of unknown cause         Axis II: No diagnosis       Axis III:  See body of report for medical issues      Axis IV:  other psychosocial or environmental problems          Axis V:  51-60 moderate symptoms

## 2012-02-24 ENCOUNTER — Ambulatory Visit (INDEPENDENT_AMBULATORY_CARE_PROVIDER_SITE_OTHER): Payer: Medicare Other | Admitting: Psychology

## 2012-02-24 DIAGNOSIS — F0630 Mood disorder due to known physiological condition, unspecified: Secondary | ICD-10-CM

## 2012-02-24 DIAGNOSIS — R413 Other amnesia: Secondary | ICD-10-CM | POA: Diagnosis not present

## 2012-02-25 ENCOUNTER — Encounter (HOSPITAL_COMMUNITY): Payer: Self-pay | Admitting: Psychology

## 2012-02-25 NOTE — Progress Notes (Signed)
GENERAL INTELLECTUAL FUNCTIONING:  The patient was initially administered the CERAD Neuropsychological Battery.  Below are the patient's scores in relationship to normative data and mild/moderate Senile Dementia of the Alzheimer's Type.      VF Name MMSE [facility] W[facility] Patient:  11 15 25 8 13 2 8   Normal Age Matched 18 14.6 28.9 10.1 21.1 7.2 9.6  Mild SDAT:  8.8 11.8 20 7.8 8.8 0.9 4.8   Moderate SDAT 6.8 10.5 16.1 6.5 6.3 0.4 3.9   On the CERAD battery, which is a battery of measures shown to be sensitive to various forms of dementia, the patient's performance was mildly impaired and below the normative expectations but just above the group of Alzheimer's dementia.  Behavioral observations indicate that the patient fully in gauge during the testing procedure but was somewhat anxious about doing well and quickly came up with excuses if he felt he had done poorly on a particular task. This is consistent with his complaints and reports of difficulties with anxiety in general.  His performance on the Mini-Mental State Exam indicates mild difficulties with time as he felt that the day of the week was Tuesday when in fact it was Wednesday but was correct as to year, season, and date. He was not able to recall my name or the name of the facility he was sent.Marland Kitchen  He was able to immediately recall 3 objects and was able to recall those 3 objects after a brief delay. He was able to spell world forwards and also able to spell "world" backwards.  There were some indications of visual spatial difficulties noted. He was able to carry out a brief series of verbal instructions.  His performance on the constructional praxis test, a test sensitive to cortically based neurological deficits, was  mildly impaired and should some difficulties with more complex planning and visual spatial abilities.  The patient's performance on the Verbal Fluency Test was mildly impaired and consistent with the mild Alzheimer's  population group.  His ability to name pictures of common and uncommon objects as measured by the [facility] Naming Test was within normal limits and suggests good targeted naming and daily naming abilities.   The patient's verbal learning and ability to encode new information was mildly impaired and below age matched normative population groups and just above those levels achieved by individuals in the mild Alzheimer's comparison group.  When words were initially presented over three separate trials, his immediate recall was mildly impaired as he recalled 13 of the possible 30 words.  He was only able to recall 2 of the original 10 words after a brief delay, which is significantly impaired and consistent with the mild Alzheimer's comparison group.  When the task was changed to a recognition task, he correctly identified 8 of the original 0 words and correctly identified 9 of the 10 words that were not on the original list.  This pattern suggests mild difficulties in coding and storing not a tour information but does suggest that queuing helps with memory recall.  Overall, this performance indicates that the patient is consistently performing below age matched normative expectations but consistently slightly above the mild Alzheimer's population on almost all measures with the exception of targeted and daily mean abilities. The patient showed some difficulties with verbal fluency, learning new information, or complicated visual spatial abilities and in particular free recall of newly lower in information. Cuing and targeted recall did appear to be better. Most of these performances were  more consistent with a mild Alzheimer's population group and they were within normative comparison group.  Summery and Conclusions:   The results of the current neuropsychological assessment which administered a broad range of various cognitive testing to be most sensitive to the early cognitive changes produced with a  cortical dementia were generally consistent with some very early signs and mild symptoms related to conditions such as Alzheimer's dementia. However, the patient's performance were not quite to the level of this mild Alzheimer's population and therefore the results suggesting this possibility should be interpreted with caution. It is clear that the patient performed below expected levels based on normative values which is clear to suggest that there are some mild cognitive deficits. However, the patient has a history of anxiety and it is clear that he was very anxious and this testing procedure. It is possible that anxiety at a significant level could produce some mild cognitive weaknesses but I do not think he could explain the totality of this performance. We will need to conduct repeat assessment in 6-12 months to assess for progressive nature to these changes and to gain more confidence in the diagnostic suggestions. While the patient did show mild cognitive deficits these were not to the level that would limit his ability to make valid and competent decisions about his basic life. I do encourage the patient and his family to work on some long-term planning at this point while there are no significant pain clearly disruptive deficits at this point. We will reassess the patient in 6-12 months to assess for progressive decline.

## 2012-03-04 ENCOUNTER — Ambulatory Visit (INDEPENDENT_AMBULATORY_CARE_PROVIDER_SITE_OTHER): Payer: Medicare Other | Admitting: Psychology

## 2012-03-04 DIAGNOSIS — F419 Anxiety disorder, unspecified: Secondary | ICD-10-CM

## 2012-03-04 DIAGNOSIS — F0281 Dementia in other diseases classified elsewhere with behavioral disturbance: Secondary | ICD-10-CM

## 2012-03-04 DIAGNOSIS — F411 Generalized anxiety disorder: Secondary | ICD-10-CM

## 2012-03-04 DIAGNOSIS — F0280 Dementia in other diseases classified elsewhere without behavioral disturbance: Secondary | ICD-10-CM | POA: Diagnosis not present

## 2012-03-04 DIAGNOSIS — F068 Other specified mental disorders due to known physiological condition: Secondary | ICD-10-CM

## 2012-03-04 DIAGNOSIS — G309 Alzheimer's disease, unspecified: Secondary | ICD-10-CM

## 2012-03-08 ENCOUNTER — Encounter (HOSPITAL_COMMUNITY): Payer: Self-pay | Admitting: Psychology

## 2012-03-08 ENCOUNTER — Ambulatory Visit (INDEPENDENT_AMBULATORY_CARE_PROVIDER_SITE_OTHER): Payer: Medicare Other | Admitting: Psychiatry

## 2012-03-08 ENCOUNTER — Encounter (HOSPITAL_COMMUNITY): Payer: Self-pay | Admitting: Psychiatry

## 2012-03-08 VITALS — Wt 160.8 lb

## 2012-03-08 DIAGNOSIS — Z79899 Other long term (current) drug therapy: Secondary | ICD-10-CM

## 2012-03-08 NOTE — Patient Instructions (Signed)
Take Depakote 500 mg in the morning and thousand milligram at bedtime for 2 weeks and then get Depakote level. Followup in 2 weeks after the blood work.

## 2012-03-08 NOTE — Progress Notes (Signed)
Today I provided feedback regarding the results of the neuropsychological assessment. These formal reports were interpreted and written out in the February 27 clinical note. The results of the current neuropsychological assessment are most consistent with findings of early stage or mild levels of Alzheimer's. While the patient has a history of a localized stroke in the past he does appear to be recovered from most of that. The pattern of cognitive deterioration is not consistent with a stepwise pattern but more of a gradual and progressive decline. These findings are most consistent with Alzheimer's condition. The patient has a long history of underlying anxiety disorder and this condition does appear to be exacerbating these features. Geographic disorientation, learning and memory dysfunction, expressive language difficulties, and overall executive functioning difficulties including verbal fluency are all noted. However, the features were very mild and more consistent with the early stages of Alzheimer's but we will need repeat assessment in 6-9 months to document progressive changes. In any event, I do think that the most salient feature of the testing as one of features consistent with mild or early stage Alzheimer's.

## 2012-03-08 NOTE — Progress Notes (Signed)
Chief complaint Medication management and followup   History of presenting illness Patient is 71 year old married retired male who came with his wife for the followup appointment. As per wife patient continues to have episodic agitation anger and mood swings. During the session he was notice easily upset irritable with his wife. Patient admitted having some anger and agitation when things are not going very well. He endorse marital issues and complex family problems. He admitted sleep is fine but he also feel very anxious and nervous at home. He denies any tremors or shakes. He is taking Depakote 500 mg in the morning and 500 mg at bedtime. I review his blood work which shows Depakote level is 27. He has WBC count of 13.3, glucose 106 however his LFT was within normal limits. His hemoglobin A1c is 5.8. He also bring list of medication which I reviewed. He has recently seen Dr. Paulita Cradle recommended Exelon patch however patient refused to take it. I also reviewed reports from psychologist and neuropsych testing which shows patient has cognitive problem with anxiety.  Current psychiatric medication Depakote 500 mg twice a day Ambien 10 mg as needed His psychiatric medication is prescribed by neurologist.  Past psychiatric history Patient denies any history of previous psychiatric inpatient treatment or any suicidal attempt. He denies any history of paranoid thinking delusions or violent behavior. He do not remember taking any psychotropic medication other than Ambien which is given by his primary care physician and he's been taking for past 3 years.  Medical history Patient has history of hypertension. His primary care physician is Dr. Gerarda Fraction. Recently he has see neurologist an extensive workup done including B12 level.  Psychosocial history Patient is born and raised in [place]. This is his third marriage. His first marriage lasted for 5 years. He has 2 sons from his first marriage however  patient has no contact with his first wife and children. Patient's second marriage last only for one year. This is his third marriage and patient has 3 children from his third wife. Patient is eating-year-old son and 51 year old son and 7 year old son. Patient denies any history of sexual verbal or emotional abuse.  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  Family history She denies any family history of psychiatric illness.  Educational background Patient has high school education.  Alcohol and substance use history Patient endorsed history of heavy alcoholism when he was in [employer]. He admitted having DWI in in past. He also endorse history of blackout due to heavy drinking. He denies any recent use of alcohol. He denies any history of illegal substances.  Mental status examination Patient is elderly man who is casually dressed and fairly groomed. He he is easily irritable and frustrated. He continues to argue with his wife when she tried to explain his behavior. His attention and concentration is poor. He denies any active or passive suicidal thinking and homicidal thinking. He denies any auditory or visual hallucination. He described his mood okay and his affect is labile. He has some difficulty recalling events. He's alert and oriented x3. His insight judgment and impulse control is fair.  Diagnoses Axis I mood disorder NOS rule out mood disorder due to general medical condition Axis II deferred Axis III see medical history Axis IV moderate Axis V 65-70  Plan I reviewed lab tests, psychological testing, medication, and collateral information. I do believe patient need higher dose of Depakote since his level is low.  I also recommended to see counselor for family and marriage counseling since this has been a stressor in his life. I will repeat Depakote level one more time once he is taking Depakote 1500 mg daily. He is getting his  medication from neurologist and reported he has enough refills. I have explained risks and benefits of medication in detail. At this time he has no side effects including any tremors or shakes. I will see him again in 2-3 weeks. Time spent 30 minutes

## 2012-03-14 ENCOUNTER — Other Ambulatory Visit (HOSPITAL_COMMUNITY): Payer: Self-pay | Admitting: Psychiatry

## 2012-03-14 ENCOUNTER — Telehealth (HOSPITAL_COMMUNITY): Payer: Self-pay | Admitting: *Deleted

## 2012-03-14 MED ORDER — DIVALPROEX SODIUM ER 500 MG PO TB24: ORAL_TABLET | ORAL | Status: DC

## 2012-03-21 DIAGNOSIS — Z79899 Other long term (current) drug therapy: Secondary | ICD-10-CM | POA: Diagnosis not present

## 2012-03-22 ENCOUNTER — Encounter (HOSPITAL_COMMUNITY): Payer: Self-pay | Admitting: Psychiatry

## 2012-03-22 ENCOUNTER — Ambulatory Visit (INDEPENDENT_AMBULATORY_CARE_PROVIDER_SITE_OTHER): Payer: Medicare Other | Admitting: Psychiatry

## 2012-03-22 VITALS — Wt 163.0 lb

## 2012-03-22 DIAGNOSIS — F39 Unspecified mood [affective] disorder: Secondary | ICD-10-CM

## 2012-03-22 MED ORDER — DIVALPROEX SODIUM ER 500 MG PO TB24: ORAL_TABLET | ORAL | Status: DC

## 2012-03-22 NOTE — Progress Notes (Signed)
Chief complaint Medication management and followup   History of presenting illness Patient is 71 year old married retired male who came with his wife for the followup appointment. As per wife patient is doing much better with increased Depakote.  He is less irritable and less agitated.  He is sleeping fine and reported no side effects.  He denies any tremors shakes or any concern about medication.  He appears much calmer and relaxed.  He sleeps fine however he still requires Ambien at night.  He had blood work done yesterday and the results are still pending.  He is scheduled to see his neurologist tomorrow, as per wife patient may start a new medication to improve his memory from his neurologist.  Current psychiatric medication Depakote 500 mg one in the morning and 2 at bedtime.   Ambien 10 mg as needed prescribed by his neurologist.  Past psychiatric history Patient denies any history of previous psychiatric inpatient treatment or any suicidal attempt. He denies any history of paranoid thinking delusions or violent behavior. He do not remember taking any psychotropic medication other than Ambien which is given by his primary care physician and he's been taking for past 3 years.  Medical history Patient has history of hypertension. His primary care physician is Dr. Gerarda Fraction. Recently he has see neurologist an extensive workup done including B12 level.  Psychosocial history Patient is born and raised in [place]. This is his third marriage. His first marriage lasted for 5 years. He has 2 sons from his first marriage however patient has no contact with his first wife and children. Patient's second marriage last only for one year. This is his third marriage and patient has 3 children from his third wife. Patient is eating-year-old son and 58 year old son and 33 year old son. Patient denies any history of sexual verbal or emotional abuse.  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  Family history She denies any family history of psychiatric illness.  Educational background Patient has high school education.  Alcohol and substance use history Patient endorsed history of heavy alcoholism when he was in [employer]. He admitted having DWI in in past. He also endorse history of blackout due to heavy drinking. He denies any recent use of alcohol. He denies any history of illegal substances.  Mental status examination Patient is elderly man who is casually dressed and fairly groomed. He he is much calmer and pleasant.  He maintained good eye contact.  His speech is clear and coherent.  His thought process is slow but logical linear and goal-directed.  He denies any active or passive suicidal thinking and homicidal thinking.  His attention and concentration is fair.  He denies any auditory or visual hallucination.  He described his mood is okay and his affect is mood congruent.  There no tremors or shakes present.  He's alert and oriented x3.  His insight judgment and impulse control is okay.  Diagnoses Axis I mood disorder NOS rule out mood disorder due to general medical condition Axis II deferred Axis III see medical history Axis IV moderate Axis V 65-70  Plan At this time patient is fairly stable on increase Depakote.  He had blood work done however is also still pending.  I will continue his current dose of Depakote.  I have explained risks and benefits of medication in detail.  I will also recommend to see therapist regularly for increase coping and social skills.  I will  see him again in 6 weeks.  We will discuss the blood results on his next appointment.

## 2012-03-23 ENCOUNTER — Telehealth (HOSPITAL_COMMUNITY): Payer: Self-pay | Admitting: *Deleted

## 2012-03-23 DIAGNOSIS — R413 Other amnesia: Secondary | ICD-10-CM | POA: Diagnosis not present

## 2012-03-23 DIAGNOSIS — I10 Essential (primary) hypertension: Secondary | ICD-10-CM | POA: Diagnosis not present

## 2012-03-23 LAB — VALPROIC ACID LEVEL: Valproic Acid Lvl: 79.7 ug/mL (ref 50.0–100.0)

## 2012-05-03 ENCOUNTER — Ambulatory Visit (INDEPENDENT_AMBULATORY_CARE_PROVIDER_SITE_OTHER): Payer: Medicare Other | Admitting: Psychiatry

## 2012-05-03 ENCOUNTER — Encounter (HOSPITAL_COMMUNITY): Payer: Self-pay | Admitting: Psychiatry

## 2012-05-03 VITALS — Wt 165.0 lb

## 2012-05-03 DIAGNOSIS — F39 Unspecified mood [affective] disorder: Secondary | ICD-10-CM

## 2012-05-03 MED ORDER — DIVALPROEX SODIUM ER 500 MG PO TB24: ORAL_TABLET | ORAL | Status: DC

## 2012-05-03 NOTE — Progress Notes (Signed)
05/03/2012.  Chief complaint I still have issues with my wife.   History of presenting illness Patient is 71 year old married retired male who came with his wife for the followup appointment. As per wife patient is doing better in his agitation anger with increased Depakote however he continues to have issues in argument in his daily life.  Patient admitted having issues with her life about dealing with children and discipline them.  Wife endorse that patient gets very upset if she tries to discipline her children.  Patient endorse that he is sleeping better and denies any recent aggression or violent behavior but feel that he needs marriage counseling.  Patient's wife also agreed with that.  Patient recently seen the neurologist and find out that he was already taking Aricept from him for his memory problem.  No new medication was added by a neurologist.  Patient brought list of medication and it was noted that he is also taking pain medication and muscle relaxants.  Patient denies any side effects of medication.  He denies any tremors or shakes.  He sleeps fine and denies any active or passive suicidal thoughts.  His last Depakote level is 74.  His appetite and weight is stable.  Current psychiatric medication Depakote 500 mg one in the morning and 2 at bedtime.   Ambien 10 mg as needed prescribed by his neurologist.  Past psychiatric history Patient denies any history of previous psychiatric inpatient treatment or any suicidal attempt. He denies any history of paranoid thinking delusions or violent behavior. He do not remember taking any psychotropic medication other than Ambien which is given by his primary care physician and he's been taking for past 3 years.  Medical history Patient has history of hypertension, hyperlipidemia, chronic back pain and memory issues. His primary care physician is Dr. Gerarda Fraction.  His neurologist is Dr. Paulita Cradle.    Psychosocial history Patient is born and raised in  [place]. This is his third marriage. His first marriage lasted for 5 years. He has 2 sons from his first marriage however patient has no contact with his first wife and children. Patient's second marriage last only for one year. This is his third marriage and patient has 3 children from his third wife. Patient is eating-year-old son and 29 year old son and 65 year old son. Patient denies any history of sexual verbal or emotional abuse.  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  Family history She denies any family history of psychiatric illness.  Educational background Patient has high school education.  Alcohol and substance use history Patient endorsed history of heavy alcoholism when he was in [employer]. He admitted having DWI in in past. He also endorse history of blackout due to heavy drinking. He denies any recent use of alcohol. He denies any history of illegal substances.  Mental status examination Patient is elderly man who is casually dressed and fairly groomed. He he is calm, cooperative and pleasant.  He gets irritable when wife is talking about marital and family issues but he maintained good eye contact.  His speech is clear and coherent.  His thought process is slow but logical linear and goal-directed.  He denies any active or passive suicidal thinking and homicidal thinking.  His attention and concentration is fair.  He denies any auditory or visual hallucination.  He described his mood is okay and his affect is mood congruent.  There no tremors or shakes present.  He's alert and  oriented x3.  His insight judgment and impulse control is okay.  Diagnoses Axis I mood disorder NOS rule out mood disorder due to general medical condition Axis II deferred Axis III see medical history Axis IV moderate Axis V 65-70  Plan I reviewed his uptake medication, uptake history and Depakote level.  Patient this time not reporting any side  effects however he continues to have marital and family issues.  I do believe patient will be benefit if he see therapist for coping and social skills with his wife.  In the past he has seen therapist in this office for testing but no more appointment was scheduled to .  I recommend to see therapist for increase coping and social skills with his wife.  Patient his wife agreed with the plan.  I will continue Depakote at this present does.  I also recommended try Ambien half tablet to avoid any problems and dependency issue.  I recommended to call us if he has any question about medication or if he feel worsening of the symptoms.  Time spent 30 minutes.  I will see him again in 2 months.

## 2012-05-04 ENCOUNTER — Ambulatory Visit (INDEPENDENT_AMBULATORY_CARE_PROVIDER_SITE_OTHER): Payer: Medicare Other | Admitting: Psychology

## 2012-05-04 DIAGNOSIS — F068 Other specified mental disorders due to known physiological condition: Secondary | ICD-10-CM

## 2012-05-04 DIAGNOSIS — F419 Anxiety disorder, unspecified: Secondary | ICD-10-CM

## 2012-05-04 DIAGNOSIS — G309 Alzheimer's disease, unspecified: Secondary | ICD-10-CM

## 2012-05-04 DIAGNOSIS — F0281 Dementia in other diseases classified elsewhere with behavioral disturbance: Secondary | ICD-10-CM | POA: Diagnosis not present

## 2012-05-04 DIAGNOSIS — F411 Generalized anxiety disorder: Secondary | ICD-10-CM

## 2012-05-04 DIAGNOSIS — F0280 Dementia in other diseases classified elsewhere without behavioral disturbance: Secondary | ICD-10-CM | POA: Diagnosis not present

## 2012-05-05 DIAGNOSIS — E559 Vitamin D deficiency, unspecified: Secondary | ICD-10-CM | POA: Diagnosis not present

## 2012-05-05 DIAGNOSIS — D649 Anemia, unspecified: Secondary | ICD-10-CM | POA: Diagnosis not present

## 2012-05-05 DIAGNOSIS — I10 Essential (primary) hypertension: Secondary | ICD-10-CM | POA: Diagnosis not present

## 2012-05-05 DIAGNOSIS — N189 Chronic kidney disease, unspecified: Secondary | ICD-10-CM | POA: Diagnosis not present

## 2012-05-05 DIAGNOSIS — Z79899 Other long term (current) drug therapy: Secondary | ICD-10-CM | POA: Diagnosis not present

## 2012-05-11 DIAGNOSIS — E875 Hyperkalemia: Secondary | ICD-10-CM | POA: Diagnosis not present

## 2012-05-11 DIAGNOSIS — R809 Proteinuria, unspecified: Secondary | ICD-10-CM | POA: Diagnosis not present

## 2012-05-11 DIAGNOSIS — I10 Essential (primary) hypertension: Secondary | ICD-10-CM | POA: Diagnosis not present

## 2012-05-11 DIAGNOSIS — N182 Chronic kidney disease, stage 2 (mild): Secondary | ICD-10-CM | POA: Diagnosis not present

## 2012-05-13 ENCOUNTER — Telehealth (HOSPITAL_COMMUNITY): Payer: Self-pay | Admitting: *Deleted

## 2012-05-17 DIAGNOSIS — J449 Chronic obstructive pulmonary disease, unspecified: Secondary | ICD-10-CM | POA: Diagnosis not present

## 2012-05-17 DIAGNOSIS — E785 Hyperlipidemia, unspecified: Secondary | ICD-10-CM | POA: Diagnosis not present

## 2012-05-17 DIAGNOSIS — I10 Essential (primary) hypertension: Secondary | ICD-10-CM | POA: Diagnosis not present

## 2012-05-17 DIAGNOSIS — R5381 Other malaise: Secondary | ICD-10-CM | POA: Diagnosis not present

## 2012-05-17 DIAGNOSIS — F528 Other sexual dysfunction not due to a substance or known physiological condition: Secondary | ICD-10-CM | POA: Diagnosis not present

## 2012-05-19 ENCOUNTER — Other Ambulatory Visit (HOSPITAL_COMMUNITY): Payer: Self-pay | Admitting: *Deleted

## 2012-05-19 ENCOUNTER — Other Ambulatory Visit (HOSPITAL_COMMUNITY): Payer: Self-pay | Admitting: Psychiatry

## 2012-05-19 DIAGNOSIS — F39 Unspecified mood [affective] disorder: Secondary | ICD-10-CM

## 2012-05-19 NOTE — Telephone Encounter (Signed)
Given prescription on 07/03/12 with one additional refill. Too soon to refill.

## 2012-05-20 ENCOUNTER — Ambulatory Visit (INDEPENDENT_AMBULATORY_CARE_PROVIDER_SITE_OTHER): Payer: Medicare Other | Admitting: Psychology

## 2012-05-20 ENCOUNTER — Encounter (HOSPITAL_COMMUNITY): Payer: Self-pay | Admitting: Psychology

## 2012-05-20 DIAGNOSIS — F068 Other specified mental disorders due to known physiological condition: Secondary | ICD-10-CM | POA: Diagnosis not present

## 2012-05-20 DIAGNOSIS — F419 Anxiety disorder, unspecified: Secondary | ICD-10-CM

## 2012-05-20 DIAGNOSIS — F411 Generalized anxiety disorder: Secondary | ICD-10-CM | POA: Diagnosis not present

## 2012-05-20 DIAGNOSIS — F0281 Dementia in other diseases classified elsewhere with behavioral disturbance: Secondary | ICD-10-CM | POA: Diagnosis not present

## 2012-05-20 DIAGNOSIS — F0280 Dementia in other diseases classified elsewhere without behavioral disturbance: Secondary | ICD-10-CM

## 2012-05-20 DIAGNOSIS — G309 Alzheimer's disease, unspecified: Secondary | ICD-10-CM | POA: Diagnosis not present

## 2012-05-20 NOTE — Progress Notes (Signed)
Patient:  Glenn [name]   DOB: Jul 12, 1941  MR Number: [id number]  Location: [facility] [address] [place] [address] Dept: [phone]  Start: 11 AM End: 12 AM  Provider/Observer:     Edgardo Roys PSYD  Chief Complaint:      Chief Complaint  Patient presents with  . Stress  . Anxiety  . Agitation    Reason For Service:     The patient was referred by his family members because of increasing concerns about the possibility of Alzheimer's or other lytic issues related to memory loss. The patient is in a general denial about the severity of the symptoms. The patient doesn't knowledge that he forgets a few things here and there and may have gotten turned around a few times and gotten lost driving but overall the patient reports he is doing well. However, the patient's wife reports that there are significant problems. In 2002 he had a stroke. The patient port said he woke up and felt numbness in his right arm. He was diagnosed with a transient ischemic attack. There is a strong family history of TIAs and high blood pressure the patient presented at the [facility] and they ran some tests and gave him some medications but did not feel that he had a major stroke. Since that time any of these symptoms have cleared up and he has had no apparent symptoms from this problem. However, the patient's wife reports that this past summer the patient would get very angry over the smallest of things. This anger and frustration also included suicidal ideation which is why he originally presented that the neurologist. The patient has an 71 year old, 71 year old, an 71 year old child. The patient has been more agitated and sharp with them and there are other times where he is described as having geographic disorientation. The patient forgets what he is going to say and then experiences or displays other  expressive language problems including circumlocutions, paraphrasic errors, as well as significant indications of combat he would show an without the appearance of knowledge of the inaccuracies of his statements   Interventions Strategy:  Cognitive/behavioral psychotherapeutic interventions along with working with the patient and his wife.  Participation Level:   Active  Participation Quality:  Appropriate      Behavioral Observation:  Well Groomed, Alert, and Labile.   Current Psychosocial Factors: The patient and his wife are having significant problems that go beyond the issues of his progressive cognitive changes. The patient has a long history of anxiety disorder and obsessive-compulsive traits as well as significant marital conflict. The patient is long since not only allow forced his wife to do everything with her children and be responsible for all of the parenting this has created some real dynamic issues between he and his children as well as his wife. As his concreteness inability to truly cope with and year with abstract interpersonal issues versus these conflicts have become more pronounced.  Content of Session:   Reviewed current symptoms and worked on both interventions the patient as well as his wife can do to deal with these ongoing conflict.  Current Status:   While the patient has clearly shown some progressive memory and cognitive declines he remains capable of learning new information and remembering multiple aspects of his life. However, the changes particularly in personality that are developing are creating reviving significant long-term conflicts within the family.  Patient Progress:   Stable  Target  Goals:   Target goals include improving the patient's coping skills but more importantly the dynamics between he and his family including he and his wife as well as his children. Specific advice has been given with regard to efforts with his wife.  Last  Reviewed:   05/04/2012  Goals Addressed Today:    Today we worked on more family dynamic issues and giving advice to the patient's wife.  Impression/Diagnosis:   The results of the current neuropsychological assessment are most consistent with findings of early stage or mild levels of Alzheimer's. While the patient has a history of a localized stroke in the past he does appear to be recovered from most of that. The pattern of cognitive deterioration is not consistent with a stepwise pattern but more of a gradual and progressive decline. These findings are most consistent with Alzheimer's condition. The patient has a long history of underlying anxiety disorder and this condition does appear to be exacerbating these features. Geographic disorientation, learning and memory dysfunction, expressive language difficulties, and overall executive functioning difficulties including verbal fluency are all noted. However, the features were very mild and more consistent with the early stages of Alzheimer's but we will need repeat assessment in 6-9 months to document progressive changes. In any event, I do think that the most salient feature of the testing as one of features consistent with mild or early stage Alzheimer's.    Diagnosis:    Axis I:  1. Alzheimer's dementia with behavioral disturbance   2. Anxiety         Axis II: No diagnosis

## 2012-05-20 NOTE — Progress Notes (Signed)
Patient:  Glenn [name]   DOB: [birth year]  MR Number: [id number]  Location: [facility] [address] [place] [address] Dept: [phone]  Start: 11 AM End: 12 PM  Provider/Observer:     Edgardo Roys PSYD  Chief Complaint:      Chief Complaint  Patient presents with  . Memory Loss  . Agitation  . Anxiety    Reason For Service:     The patient was referred by his family members because of increasing concerns about the possibility of Alzheimer's or other lytic issues related to memory loss. The patient is in a general denial about the severity of the symptoms. The patient doesn't knowledge that he forgets a few things here and there and may have gotten turned around a few times and gotten lost driving but overall the patient reports he is doing well. However, the patient's wife reports that there are significant problems. In 2002 he had a stroke. The patient port said he woke up and felt numbness in his right arm. He was diagnosed with a transient ischemic attack. There is a strong family history of TIAs and high blood pressure the patient presented at the [facility] and they ran some tests and gave him some medications but did not feel that he had a major stroke. Since that time any of these symptoms have cleared up and he has had no apparent symptoms from this problem. However, the patient's wife reports that this past summer the patient would get very angry over the smallest of things. This anger and frustration also included suicidal ideation which is why he originally presented that the neurologist. The patient has an 70 year old, 71 year old, an 46 year old child. The patient has been more agitated and sharp with them and there are other times where he is described as having geographic disorientation. The patient forgets what he is going to say and then experiences or displays other  expressive language problems including circumlocutions, paraphrasic errors, as well as significant indications of combat he would show an without the appearance of knowledge of the inaccuracies of his statements   Interventions Strategy:  Cognitive/behavioral psychotherapeutic interventions along with working with the patient and his wife.  Participation Level:   Active  Participation Quality:  Appropriate      Behavioral Observation:  Well Groomed, Alert, and Labile.   Current Psychosocial Factors: The patient and his wife in particular his wife have been working on some of the therapeutic strategies we have been developing. However, there've been some major negative interactions between the patient and his 72 year old son. There is a great deal of resentment in issues of self-esteem and his son based on the dynamics between the patient and his son over the years.  Content of Session:   Reviewed current symptoms and worked on both interventions the patient as well as his wife can do to deal with these ongoing conflict.  Current Status:   While the patient has clearly shown some progressive memory and cognitive declines he remains capable of learning new information and remembering multiple aspects of his life. However, the changes particularly in personality that are developing are creating reviving significant long-term conflicts within the family.  Patient Progress:   Stable  Target Goals:   Target goals include improving the patient's coping skills but more importantly the dynamics between he and his family including he and his wife as well as his children. Specific advice has been given  with regard to efforts with his wife.  Last Reviewed:   05/04/2012  Goals Addressed Today:    Today we worked on more family dynamic issues and giving advice to the patient's wife.  Impression/Diagnosis:   The results of the current neuropsychological assessment are most consistent with findings of early  stage or mild levels of Alzheimer's. While the patient has a history of a localized stroke in the past he does appear to be recovered from most of that. The pattern of cognitive deterioration is not consistent with a stepwise pattern but more of a gradual and progressive decline. These findings are most consistent with Alzheimer's condition. The patient has a long history of underlying anxiety disorder and this condition does appear to be exacerbating these features. Geographic disorientation, learning and memory dysfunction, expressive language difficulties, and overall executive functioning difficulties including verbal fluency are all noted. However, the features were very mild and more consistent with the early stages of Alzheimer's but we will need repeat assessment in 6-9 months to document progressive changes. In any event, I do think that the most salient feature of the testing as one of features consistent with mild or early stage Alzheimer's.    Diagnosis:    Axis I:  1. Alzheimer's dementia with behavioral disturbance   2. Anxiety         Axis II: No diagnosis

## 2012-05-24 ENCOUNTER — Telehealth (HOSPITAL_COMMUNITY): Payer: Self-pay | Admitting: *Deleted

## 2012-06-07 ENCOUNTER — Telehealth (HOSPITAL_COMMUNITY): Payer: Self-pay | Admitting: *Deleted

## 2012-06-10 ENCOUNTER — Ambulatory Visit (INDEPENDENT_AMBULATORY_CARE_PROVIDER_SITE_OTHER): Payer: Medicare Other | Admitting: Psychology

## 2012-06-10 DIAGNOSIS — F0280 Dementia in other diseases classified elsewhere without behavioral disturbance: Secondary | ICD-10-CM | POA: Diagnosis not present

## 2012-06-10 DIAGNOSIS — F411 Generalized anxiety disorder: Secondary | ICD-10-CM

## 2012-06-10 DIAGNOSIS — F419 Anxiety disorder, unspecified: Secondary | ICD-10-CM

## 2012-06-10 DIAGNOSIS — G309 Alzheimer's disease, unspecified: Secondary | ICD-10-CM

## 2012-06-10 DIAGNOSIS — F0281 Dementia in other diseases classified elsewhere with behavioral disturbance: Secondary | ICD-10-CM | POA: Diagnosis not present

## 2012-06-20 ENCOUNTER — Telehealth (HOSPITAL_COMMUNITY): Payer: Self-pay | Admitting: *Deleted

## 2012-06-21 ENCOUNTER — Telehealth (HOSPITAL_COMMUNITY): Payer: Self-pay | Admitting: *Deleted

## 2012-06-28 ENCOUNTER — Ambulatory Visit (HOSPITAL_COMMUNITY): Payer: Self-pay | Admitting: Psychiatry

## 2012-07-05 ENCOUNTER — Ambulatory Visit (INDEPENDENT_AMBULATORY_CARE_PROVIDER_SITE_OTHER): Payer: Medicare Other | Admitting: Psychiatry

## 2012-07-05 ENCOUNTER — Encounter (HOSPITAL_COMMUNITY): Payer: Self-pay | Admitting: Psychiatry

## 2012-07-05 DIAGNOSIS — F39 Unspecified mood [affective] disorder: Secondary | ICD-10-CM

## 2012-07-05 MED ORDER — DIVALPROEX SODIUM ER 500 MG PO TB24: ORAL_TABLET | ORAL | Status: DC

## 2012-07-05 NOTE — Progress Notes (Signed)
Chief complaint I still have issues with my wife.   History of presenting illness Patient is 71 year old married retired male who came for his followup appointment.  Patient come by himself usually comes with his wife.  Patient told his wife was busy today and cannot come with him.  Patient continued to endorse family issues at home.  He is seeing therapist for coping skills however he feel his marriage did not last long due to significant marital issues.  Overall he feel less irritable less angry but admitted being more frustrated with his wife.  He is sleeping better from past but is still requires Ambien 10 mg on some nights.  She denies any side effects of medication including any tremors or shakes.  He likes Depakote which he's been taking as prescribed.  He denies any active or passive suicidal thoughts or homicidal thoughts.  He does not believe that he has a memory problem however he agrees that if other people are noticing than it could be true.  His last Depakote leve and March 2013 was 74.  His appetite and weight is stable.  Current psychiatric medication Depakote 500 mg one in the morning and 2 at bedtime.   Ambien 10 mg as needed prescribed by his neurologist.  Past psychiatric history Patient denies any history of previous psychiatric inpatient treatment or any suicidal attempt. He denies any history of paranoid thinking delusions or violent behavior. He do not remember taking any psychotropic medication other than Ambien which is given by his primary care physician and he's been taking for past 3 years.  Medical history Patient has history of hypertension, hyperlipidemia, chronic back pain and memory issues. His primary care physician is Dr. Gerarda Fraction.  His neurologist is Dr. Paulita Cradle.    Psychosocial history Patient is born and raised in [place]. This is his third marriage. His first marriage lasted for 5 years. He has 2 sons from his first marriage however patient has no contact  with his first wife and children. Patient's second marriage last only for one year. This is his third marriage and patient has 3 children from his third wife. Patient is eating-year-old son and 81 year old son and 50 year old son. Patient denies any history of sexual verbal or emotional abuse.  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  Family history She denies any family history of psychiatric illness.  Educational background Patient has high school education.  Alcohol and substance use history Patient endorsed history of heavy alcoholism when he was in [employer]. He admitted having DWI in in past. He also endorse history of blackout due to heavy drinking. He denies any recent use of alcohol. He denies any history of illegal substances.  Mental status examination Patient is elderly man who is casually dressed and fairly groomed. He he is calm, cooperative and pleasant.  He gets irritable when talk about his wife and family issues.  He maintains good eye contact.  His speech is clear and coherent.  His thought process is slow but logical linear and goal-directed.  He denies any active or passive suicidal thinking and homicidal thinking.  His attention and concentration is fair.  He denies any auditory or visual hallucination.  He described his mood is okay and his affect is mood congruent.  There no tremors or shakes present.  He's alert and oriented x3.  His insight judgment and impulse control is okay.  Diagnoses Axis I mood disorder NOS rule  out mood disorder due to general medical condition Axis II deferred Axis III see medical history Axis IV moderate Axis V 65-70  Plan I recommend to see a marriage counselor .  I will continue his Depakote .  At this time patient does not have any side effects.  I explained the risks and benefits of medication .  I will see him again in 2 months.  Portion of this note is generated with voice dictation  software and may contain typographical error.

## 2012-07-07 ENCOUNTER — Other Ambulatory Visit (HOSPITAL_COMMUNITY): Payer: Self-pay | Admitting: Physician Assistant

## 2012-07-07 DIAGNOSIS — J309 Allergic rhinitis, unspecified: Secondary | ICD-10-CM | POA: Diagnosis not present

## 2012-07-07 DIAGNOSIS — R109 Unspecified abdominal pain: Secondary | ICD-10-CM

## 2012-07-07 DIAGNOSIS — R195 Other fecal abnormalities: Secondary | ICD-10-CM | POA: Diagnosis not present

## 2012-07-07 DIAGNOSIS — IMO0002 Reserved for concepts with insufficient information to code with codable children: Secondary | ICD-10-CM | POA: Diagnosis not present

## 2012-07-08 DIAGNOSIS — R195 Other fecal abnormalities: Secondary | ICD-10-CM | POA: Diagnosis not present

## 2012-07-08 DIAGNOSIS — R109 Unspecified abdominal pain: Secondary | ICD-10-CM | POA: Diagnosis not present

## 2012-07-11 ENCOUNTER — Encounter (HOSPITAL_COMMUNITY): Payer: Self-pay

## 2012-07-11 ENCOUNTER — Ambulatory Visit (HOSPITAL_COMMUNITY)
Admission: RE | Admit: 2012-07-11 | Discharge: 2012-07-11 | Disposition: A | Discharge status: Home / Self Care | Payer: Medicare Other | Source: Ambulatory Visit | Attending: Physician Assistant | Admitting: Physician Assistant

## 2012-07-11 DIAGNOSIS — R918 Other nonspecific abnormal finding of lung field: Secondary | ICD-10-CM | POA: Diagnosis not present

## 2012-07-11 DIAGNOSIS — R109 Unspecified abdominal pain: Secondary | ICD-10-CM

## 2012-07-11 DIAGNOSIS — R195 Other fecal abnormalities: Secondary | ICD-10-CM | POA: Insufficient documentation

## 2012-07-11 DIAGNOSIS — N281 Cyst of kidney, acquired: Secondary | ICD-10-CM | POA: Diagnosis not present

## 2012-07-11 DIAGNOSIS — I10 Essential (primary) hypertension: Secondary | ICD-10-CM | POA: Diagnosis not present

## 2012-07-11 DIAGNOSIS — Q619 Cystic kidney disease, unspecified: Secondary | ICD-10-CM | POA: Diagnosis not present

## 2012-07-11 DIAGNOSIS — R1011 Right upper quadrant pain: Secondary | ICD-10-CM | POA: Insufficient documentation

## 2012-07-11 DIAGNOSIS — N200 Calculus of kidney: Secondary | ICD-10-CM | POA: Insufficient documentation

## 2012-07-11 DIAGNOSIS — M4180 Other forms of scoliosis, site unspecified: Secondary | ICD-10-CM | POA: Insufficient documentation

## 2012-07-14 ENCOUNTER — Encounter (HOSPITAL_COMMUNITY): Payer: Self-pay | Admitting: Psychology

## 2012-07-14 ENCOUNTER — Ambulatory Visit (INDEPENDENT_AMBULATORY_CARE_PROVIDER_SITE_OTHER): Payer: Medicare Other | Admitting: Psychology

## 2012-07-14 DIAGNOSIS — F0281 Dementia in other diseases classified elsewhere with behavioral disturbance: Secondary | ICD-10-CM | POA: Diagnosis not present

## 2012-07-14 DIAGNOSIS — F411 Generalized anxiety disorder: Secondary | ICD-10-CM | POA: Diagnosis not present

## 2012-07-14 DIAGNOSIS — F0280 Dementia in other diseases classified elsewhere without behavioral disturbance: Secondary | ICD-10-CM

## 2012-07-14 DIAGNOSIS — F419 Anxiety disorder, unspecified: Secondary | ICD-10-CM

## 2012-07-14 DIAGNOSIS — G309 Alzheimer's disease, unspecified: Secondary | ICD-10-CM | POA: Diagnosis not present

## 2012-07-14 NOTE — Progress Notes (Signed)
Patient:  Glenn [name]   DOB: 1941-10-16  MR Number: [id number]  Location: [facility] [address] [place] [address] Dept: [phone]  Start: 10 AM End: 11 AM  Provider/Observer:     Edgardo Roys PSYD  Chief Complaint:      Chief Complaint  Patient presents with  . Anxiety  . Agitation  . Memory Loss  . Stress    Reason For Service:     The patient was referred by his family members because of increasing concerns about the possibility of Alzheimer's or other lytic issues related to memory loss. The patient is in a general denial about the severity of the symptoms. The patient doesn't knowledge that he forgets a few things here and there and may have gotten turned around a few times and gotten lost driving but overall the patient reports he is doing well. However, the patient's wife reports that there are significant problems. In 2002 he had a stroke. The patient port said he woke up and felt numbness in his right arm. He was diagnosed with a transient ischemic attack. There is a strong family history of TIAs and high blood pressure the patient presented at the [facility] and they ran some tests and gave him some medications but did not feel that he had a major stroke. Since that time any of these symptoms have cleared up and he has had no apparent symptoms from this problem. However, the patient's wife reports that this past summer the patient would get very angry over the smallest of things. This anger and frustration also included suicidal ideation which is why he originally presented that the neurologist. The patient has an 71 year old, 70 year old, an 1 year old child. The patient has been more agitated and sharp with them and there are other times where he is described as having geographic disorientation. The patient forgets what he is going to say and then experiences or  displays other expressive language problems including circumlocutions, paraphrasic errors, as well as significant indications of combat he would show an without the appearance of knowledge of the inaccuracies of his statements   Interventions Strategy:  Cognitive/behavioral psychotherapeutic interventions along with working with the patient and his wife.  Participation Level:   Active  Participation Quality:  Appropriate      Behavioral Observation:  Well Groomed, Alert, and Labile.   Current Psychosocial Factors: The patient, his wife, and his young son all came in today and discussed what was going on with the dynamics within the family. The patient is continuing to have some significant difficulties in adjustment and is very angry about situations and misinterpret what people are saying. However, much of this has to do with his lifelong symptoms and difficulty with anxiety and are directly related to her Kennedy Bucker caused by his diagnosis and cognitive difficulties are Alzheimer's.  Content of Session:   Reviewed current symptoms and worked on both interventions the patient as well as his wife can do to deal with these ongoing conflict.  Current Status:   While the patient has clearly shown some progressive memory and cognitive declines he remains capable of learning new information and remembering multiple aspects of his life. However, the changes particularly in personality that are developing are creating reviving significant long-term conflicts within the family.  Patient Progress:   Stable  Target Goals:   Target goals include improving the patient's coping skills but more importantly the dynamics between he  and his family including he and his wife as well as his children. Specific advice has been given with regard to efforts with his wife.  Last Reviewed:   06/10/2012  Goals Addressed Today:    Today we worked on more family dynamic issues and giving advice to the patient's  wife.  Impression/Diagnosis:   The results of the current neuropsychological assessment are most consistent with findings of early stage or mild levels of Alzheimer's. While the patient has a history of a localized stroke in the past he does appear to be recovered from most of that. The pattern of cognitive deterioration is not consistent with a stepwise pattern but more of a gradual and progressive decline. These findings are most consistent with Alzheimer's condition. The patient has a long history of underlying anxiety disorder and this condition does appear to be exacerbating these features. Geographic disorientation, learning and memory dysfunction, expressive language difficulties, and overall executive functioning difficulties including verbal fluency are all noted. However, the features were very mild and more consistent with the early stages of Alzheimer's but we will need repeat assessment in 6-9 months to document progressive changes. In any event, I do think that the most salient feature of the testing as one of features consistent with mild or early stage Alzheimer's.    Diagnosis:    Axis I:  1. Alzheimer's dementia with behavioral disturbance   2. Anxiety         Axis II: No diagnosis

## 2012-07-14 NOTE — Progress Notes (Signed)
Patient:  Glenn [name]   DOB: [birth year]  MR Number: [id number]  Location: [facility]- [address] [place] [address] Dept: [phone]  Start: 10 AM End: 11 AM  Provider/Observer:     Edgardo Roys PSYD  Chief Complaint:      Chief Complaint  Patient presents with  . Agitation  . Memory Loss  . Stress  . Anxiety    Reason For Service:     The patient was referred by his family members because of increasing concerns about the possibility of Alzheimer's or other lytic issues related to memory loss. The patient is in a general denial about the severity of the symptoms. The patient doesn't knowledge that he forgets a few things here and there and may have gotten turned around a few times and gotten lost driving but overall the patient reports he is doing well. However, the patient's wife reports that there are significant problems. In 2002 he had a stroke. The patient port said he woke up and felt numbness in his right arm. He was diagnosed with a transient ischemic attack. There is a strong family history of TIAs and high blood pressure the patient presented at the [facility] and they ran some tests and gave him some medications but did not feel that he had a major stroke. Since that time any of these symptoms have cleared up and he has had no apparent symptoms from this problem. However, the patient's wife reports that this past summer the patient would get very angry over the smallest of things. This anger and frustration also included suicidal ideation which is why he originally presented that the neurologist. The patient has an 71 year old, 71 year old, an 43 year old child. The patient has been more agitated and sharp with them and there are other times where he is described as having geographic disorientation. The patient forgets what he is going to say and then experiences or  displays other expressive language problems including circumlocutions, paraphrasic errors, as well as significant indications of combat he would show an without the appearance of knowledge of the inaccuracies of his statements   Interventions Strategy:  Cognitive/behavioral psychotherapeutic interventions along with working with the patient and his wife.  Participation Level:   Active  Participation Quality:  Appropriate      Behavioral Observation:  Well Groomed, Alert, and Labile.   Current Psychosocial Factors: The patient comes in today along with his wife and report that there continue to be some significant marital difficulties as well as difficulties with the patient's relationship with his older children as well as his current children. The patient acknowledges that he is quite angry and that everybody is criticizing him and saying things are his fault. However, he is having difficulty understanding how all this is fitting together although he does realize that he is much better off staying married at this point in his life and trying to separate..  Content of Session:   Reviewed current symptoms and worked on both interventions the patient as well as his wife can do to deal with these ongoing conflict.  Current Status:   While the patient has clearly shown some progressive memory and cognitive declines he remains capable of learning new information and remembering multiple aspects of his life. However, the changes particularly in personality that are developing are creating reviving significant long-term conflicts within the family.  Patient Progress:   Stable  Target Goals:   Target  goals include improving the patient's coping skills but more importantly the dynamics between he and his family including he and his wife as well as his children. Specific advice has been given with regard to efforts with his wife.  Last Reviewed:   71/18/2013  Goals Addressed Today:    Today we worked on  more family dynamic issues and giving advice to the patient's wife.  Impression/Diagnosis:   The results of the current neuropsychological assessment are most consistent with findings of early stage or mild levels of Alzheimer's. While the patient has a history of a localized stroke in the past he does appear to be recovered from most of that. The pattern of cognitive deterioration is not consistent with a stepwise pattern but more of a gradual and progressive decline. These findings are most consistent with Alzheimer's condition. The patient has a long history of underlying anxiety disorder and this condition does appear to be exacerbating these features. Geographic disorientation, learning and memory dysfunction, expressive language difficulties, and overall executive functioning difficulties including verbal fluency are all noted. However, the features were very mild and more consistent with the early stages of Alzheimer's but we will need repeat assessment in 6-9 months to document progressive changes. In any event, I do think that the most salient feature of the testing as one of features consistent with mild or early stage Alzheimer's.    Diagnosis:    Axis I:  1. Alzheimer's dementia with behavioral disturbance   2. Anxiety         Axis II: No diagnosis

## 2012-07-19 DIAGNOSIS — R413 Other amnesia: Secondary | ICD-10-CM | POA: Diagnosis not present

## 2012-07-27 ENCOUNTER — Telehealth (HOSPITAL_COMMUNITY): Payer: Self-pay | Admitting: *Deleted

## 2012-08-02 ENCOUNTER — Ambulatory Visit (INDEPENDENT_AMBULATORY_CARE_PROVIDER_SITE_OTHER): Payer: Medicare Other | Admitting: Psychology

## 2012-08-02 DIAGNOSIS — F0150 Vascular dementia without behavioral disturbance: Secondary | ICD-10-CM | POA: Diagnosis not present

## 2012-08-02 DIAGNOSIS — I679 Cerebrovascular disease, unspecified: Secondary | ICD-10-CM

## 2012-08-05 ENCOUNTER — Emergency Department (HOSPITAL_COMMUNITY): Payer: Medicare Other

## 2012-08-05 ENCOUNTER — Inpatient Hospital Stay (HOSPITAL_COMMUNITY): Payer: Medicare Other

## 2012-08-05 ENCOUNTER — Encounter (HOSPITAL_COMMUNITY): Payer: Self-pay | Admitting: Psychology

## 2012-08-05 ENCOUNTER — Encounter (HOSPITAL_COMMUNITY): Payer: Self-pay

## 2012-08-05 ENCOUNTER — Ambulatory Visit (HOSPITAL_COMMUNITY): Payer: Self-pay | Admitting: Psychology

## 2012-08-05 ENCOUNTER — Inpatient Hospital Stay (HOSPITAL_COMMUNITY)
Admission: EM | Admit: 2012-08-05 | Discharge: 2012-08-09 | DRG: 066 | Disposition: A | Discharge status: Home / Self Care | Payer: Medicare Other | Attending: Internal Medicine | Admitting: Internal Medicine

## 2012-08-05 DIAGNOSIS — Z720 Tobacco use: Secondary | ICD-10-CM | POA: Diagnosis present

## 2012-08-05 DIAGNOSIS — M549 Dorsalgia, unspecified: Secondary | ICD-10-CM | POA: Diagnosis present

## 2012-08-05 DIAGNOSIS — G3184 Mild cognitive impairment, so stated: Secondary | ICD-10-CM | POA: Diagnosis present

## 2012-08-05 DIAGNOSIS — I658 Occlusion and stenosis of other precerebral arteries: Secondary | ICD-10-CM | POA: Diagnosis not present

## 2012-08-05 DIAGNOSIS — R569 Unspecified convulsions: Secondary | ICD-10-CM | POA: Diagnosis present

## 2012-08-05 DIAGNOSIS — J449 Chronic obstructive pulmonary disease, unspecified: Secondary | ICD-10-CM | POA: Diagnosis not present

## 2012-08-05 DIAGNOSIS — I6340 Cerebral infarction due to embolism of unspecified cerebral artery: Secondary | ICD-10-CM | POA: Diagnosis not present

## 2012-08-05 DIAGNOSIS — I6529 Occlusion and stenosis of unspecified carotid artery: Secondary | ICD-10-CM | POA: Diagnosis not present

## 2012-08-05 DIAGNOSIS — J4489 Other specified chronic obstructive pulmonary disease: Secondary | ICD-10-CM | POA: Diagnosis present

## 2012-08-05 DIAGNOSIS — K219 Gastro-esophageal reflux disease without esophagitis: Secondary | ICD-10-CM | POA: Diagnosis present

## 2012-08-05 DIAGNOSIS — Z823 Family history of stroke: Secondary | ICD-10-CM

## 2012-08-05 DIAGNOSIS — E785 Hyperlipidemia, unspecified: Secondary | ICD-10-CM | POA: Diagnosis not present

## 2012-08-05 DIAGNOSIS — F329 Major depressive disorder, single episode, unspecified: Secondary | ICD-10-CM | POA: Diagnosis present

## 2012-08-05 DIAGNOSIS — Z79899 Other long term (current) drug therapy: Secondary | ICD-10-CM

## 2012-08-05 DIAGNOSIS — Z8673 Personal history of transient ischemic attack (TIA), and cerebral infarction without residual deficits: Secondary | ICD-10-CM

## 2012-08-05 DIAGNOSIS — I517 Cardiomegaly: Secondary | ICD-10-CM | POA: Diagnosis not present

## 2012-08-05 DIAGNOSIS — I10 Essential (primary) hypertension: Secondary | ICD-10-CM | POA: Diagnosis not present

## 2012-08-05 DIAGNOSIS — F39 Unspecified mood [affective] disorder: Secondary | ICD-10-CM

## 2012-08-05 DIAGNOSIS — F3289 Other specified depressive episodes: Secondary | ICD-10-CM | POA: Diagnosis present

## 2012-08-05 DIAGNOSIS — I6350 Cerebral infarction due to unspecified occlusion or stenosis of unspecified cerebral artery: Principal | ICD-10-CM | POA: Diagnosis present

## 2012-08-05 DIAGNOSIS — R4182 Altered mental status, unspecified: Secondary | ICD-10-CM | POA: Diagnosis not present

## 2012-08-05 DIAGNOSIS — F17200 Nicotine dependence, unspecified, uncomplicated: Secondary | ICD-10-CM | POA: Diagnosis present

## 2012-08-05 DIAGNOSIS — R413 Other amnesia: Secondary | ICD-10-CM | POA: Diagnosis present

## 2012-08-05 DIAGNOSIS — I639 Cerebral infarction, unspecified: Secondary | ICD-10-CM | POA: Diagnosis present

## 2012-08-05 DIAGNOSIS — R5381 Other malaise: Secondary | ICD-10-CM | POA: Diagnosis not present

## 2012-08-05 HISTORY — DX: Cerebral infarction, unspecified: I63.9

## 2012-08-05 HISTORY — DX: Chronic obstructive pulmonary disease, unspecified: J44.9

## 2012-08-05 LAB — CBC WITH DIFFERENTIAL/PLATELET
Eosinophils Absolute: 0.5 10*3/uL (ref 0.0–0.7)
Eosinophils Relative: 7 % — ABNORMAL HIGH (ref 0–5)
HCT: 41.1 % (ref 39.0–52.0)
Hemoglobin: 14.1 g/dL (ref 13.0–17.0)
Lymphs Abs: 1.5 10*3/uL (ref 0.7–4.0)
MCH: 30.8 pg (ref 26.0–34.0)
MCV: 89.7 fL (ref 78.0–100.0)
Monocytes Relative: 12 % (ref 3–12)
RBC: 4.58 MIL/uL (ref 4.22–5.81)

## 2012-08-05 LAB — BASIC METABOLIC PANEL WITH GFR
BUN: 19 mg/dL (ref 6–23)
CO2: 27 meq/L (ref 19–32)
Calcium: 9.3 mg/dL (ref 8.4–10.5)
Chloride: 106 meq/L (ref 96–112)
Creatinine, Ser: 1.38 mg/dL — ABNORMAL HIGH (ref 0.50–1.35)
GFR calc Af Amer: 58 mL/min — ABNORMAL LOW
GFR calc non Af Amer: 50 mL/min — ABNORMAL LOW
Glucose, Bld: 90 mg/dL (ref 70–99)
Potassium: 4.3 meq/L (ref 3.5–5.1)
Sodium: 141 meq/L (ref 135–145)

## 2012-08-05 LAB — URINALYSIS, ROUTINE W REFLEX MICROSCOPIC
Bilirubin Urine: NEGATIVE
Glucose, UA: NEGATIVE mg/dL
Hgb urine dipstick: NEGATIVE
Protein, ur: NEGATIVE mg/dL
Specific Gravity, Urine: >1.03 — ABNORMAL HIGH (ref 1.005–1.030)
Urobilinogen, UA: 0.2 mg/dL (ref 0.0–1.0)

## 2012-08-05 MED ORDER — ALBUTEROL SULFATE HFA 108 (90 BASE) MCG/ACT IN AERS: 2.0000 | INHALATION_SPRAY | Freq: Four times a day (QID) | RESPIRATORY_TRACT | Status: DC | PRN

## 2012-08-05 MED ORDER — OXYCODONE HCL 5 MG PO TABS: 10.0000 mg | ORAL_TABLET | Freq: Four times a day (QID) | ORAL | Status: DC | PRN

## 2012-08-05 MED ORDER — DIVALPROEX SODIUM ER 500 MG PO TB24
1000.0000 mg | ORAL_TABLET | Freq: Every day | ORAL | Status: DC
  Administered 2012-08-05 – 2012-08-08 (×4): 1000 mg via ORAL
  Filled 2012-08-05 (×6): qty 2

## 2012-08-05 MED ORDER — SENNOSIDES-DOCUSATE SODIUM 8.6-50 MG PO TABS
1.0000 | ORAL_TABLET | Freq: Every evening | ORAL | Status: DC | PRN
  Filled 2012-08-05: qty 1

## 2012-08-05 MED ORDER — ZOLPIDEM TARTRATE 5 MG PO TABS: 10.0000 mg | ORAL_TABLET | Freq: Every evening | ORAL | Status: DC | PRN

## 2012-08-05 MED ORDER — LORATADINE 10 MG PO TABS
10.0000 mg | ORAL_TABLET | Freq: Every day | ORAL | Status: DC
  Administered 2012-08-06 – 2012-08-09 (×4): 10 mg via ORAL
  Filled 2012-08-05 (×4): qty 1

## 2012-08-05 MED ORDER — DIVALPROEX SODIUM ER 500 MG PO TB24
500.0000 mg | ORAL_TABLET | Freq: Every day | ORAL | Status: DC
  Administered 2012-08-06 – 2012-08-09 (×4): 500 mg via ORAL
  Filled 2012-08-05 (×6): qty 1

## 2012-08-05 MED ORDER — HEPARIN SODIUM (PORCINE) 5000 UNIT/ML IJ SOLN
5000.0000 [IU] | Freq: Three times a day (TID) | INTRAMUSCULAR | Status: DC
  Administered 2012-08-05 – 2012-08-06 (×2): 5000 [IU] via SUBCUTANEOUS
  Filled 2012-08-05 (×8): qty 1

## 2012-08-05 MED ORDER — ATORVASTATIN CALCIUM 40 MG PO TABS
40.0000 mg | ORAL_TABLET | Freq: Every day | ORAL | Status: DC
  Administered 2012-08-05 – 2012-08-08 (×4): 40 mg via ORAL
  Filled 2012-08-05 (×6): qty 1

## 2012-08-05 MED ORDER — GADOBENATE DIMEGLUMINE 529 MG/ML IV SOLN
15.0000 mL | Freq: Once | INTRAVENOUS | Status: AC | PRN
  Administered 2012-08-05: 15 mL via INTRAVENOUS

## 2012-08-05 MED ORDER — DIVALPROEX SODIUM ER 500 MG PO TB24: 500.0000 mg | ORAL_TABLET | Freq: Every day | ORAL | Status: DC

## 2012-08-05 MED ORDER — AMLODIPINE BESYLATE 5 MG PO TABS
5.0000 mg | ORAL_TABLET | Freq: Every morning | ORAL | Status: DC
  Administered 2012-08-06 – 2012-08-09 (×4): 5 mg via ORAL
  Filled 2012-08-05 (×4): qty 1

## 2012-08-05 MED ORDER — ONDANSETRON HCL 4 MG/2ML IJ SOLN: 4.0000 mg | Freq: Four times a day (QID) | INTRAMUSCULAR | Status: DC | PRN

## 2012-08-05 MED ORDER — DIVALPROEX SODIUM ER 500 MG PO TB24: 500.0000 mg | ORAL_TABLET | Freq: Two times a day (BID) | ORAL | Status: DC

## 2012-08-05 MED ORDER — DONEPEZIL HCL 5 MG PO TABS
10.0000 mg | ORAL_TABLET | Freq: Every day | ORAL | Status: DC
  Administered 2012-08-05 – 2012-08-08 (×4): 10 mg via ORAL
  Filled 2012-08-05 (×6): qty 2

## 2012-08-05 MED ORDER — SODIUM CHLORIDE 0.9 % IV SOLN
INTRAVENOUS | Status: DC
  Administered 2012-08-06: 10:00:00 via INTRAVENOUS

## 2012-08-05 NOTE — ED Notes (Signed)
Dr.cook to see pt, pt denies any complaints at present, able to stand and walk w/o leaning. All grips equal, no deficit noted during exam

## 2012-08-05 NOTE — ED Notes (Signed)
Pt brought to er by wife, was in car for doctors appointment, went to pick up a drink and was unable, wife reports that he began leaning to left side in car. Started at 10:35 per wife, pt denies any pain, states that left is weaker than right, but is unable to give home address.

## 2012-08-05 NOTE — Progress Notes (Signed)
Pt only uses mdi when he feels he needs it. He has some occasional wheezes but does not feel sob , he is still smoking

## 2012-08-05 NOTE — ED Provider Notes (Signed)
History   This chart was scribed for Nat Christen, MD by Marin Comment . The patient was seen in room APA01/APA01. Patient's care was started at 1117.    CSN: [id number]  Arrival date & time 08/05/12  1101   First MD Initiated Contact with Patient 08/05/12 1117      Chief Complaint  Patient presents with  . Transient Ischemic Attack    (Consider location/radiation/quality/duration/timing/severity/associated sxs/prior treatment) HPI LEWELLYN PINAL is a 71 y.o. male who presents to the Emergency Department complaining of constant, moderate generalized weakness that is worse on left with an onset of around 10:35 am today. Wife reports that on the way to his appointment to his psychiatrist to be el evaluated for memory changes that he has had over the last year. Wife states that the pt had "left hand curling, trouble lifting left arm, started leaning to the left and states that he had trouble lifting his left leg to get out of a car. Wife states that the pt then tried to ambulate, but was walking in a circle. Wife also reports associated confusion. Pt denies any complaints of pain at this time. Wife states that the symptoms have started to improve after arrival to ED. Pt reports a medical h/o HTN, hyperlipidemia, and TIA in 2001. Wife reports that the pt has a family h/o stroke. Pt is a smoker....Marland Kitchenlevel V caveat for urgent need for intervention   PCP: Dr. Gerarda Fraction  Past Medical History  Diagnosis Date  . HTN (hypertension)   . Memory changes   . Back pain   . GERD (gastroesophageal reflux disease)   . Stroke     History reviewed. No pertinent past surgical history.  No family history on file.  History  Substance Use Topics  . Smoking status: Current Everyday Smoker  . Smokeless tobacco: Not on file  . Alcohol Use: No      Review of Systems  Unable to perform ROS: Other   A complete 10 system review of systems was obtained and all systems are negative except as noted in  the HPI and PMH.   Allergies  Review of patient's allergies indicates no known allergies.  Home Medications   Current Outpatient Rx  Name Route Sig Dispense Refill  . ALBUTEROL SULFATE HFA 108 (90 BASE) MCG/ACT IN AERS Inhalation Inhale 2 puffs into the lungs every 6 (six) hours as needed. For shortness of breath    . AMLODIPINE BESYLATE 5 MG PO TABS Oral Take 5 mg by mouth every morning.     . ATORVASTATIN CALCIUM 40 MG PO TABS Oral Take 40 mg by mouth at bedtime.     Marland Kitchen CETIRIZINE HCL 10 MG PO TABS Oral Take 10 mg by mouth every morning.    Marland Kitchen DIVALPROEX SODIUM ER 500 MG PO TB24 Oral Take 500-1,000 mg by mouth 2 (two) times daily. Take 1 tablet in the morning and 2 tablets in the evening    . DONEPEZIL HCL 10 MG PO TABS Oral Take 10 mg by mouth at bedtime.     . OXYCODONE HCL 10 MG PO TABS Oral Take 10 mg by mouth 4 (four) times daily as needed. For pain    . ZOLPIDEM TARTRATE 10 MG PO TABS Oral Take 10 mg by mouth at bedtime as needed. For sleep      BP 140/86  Pulse 72  Temp 97.8 F (36.6 C) (Oral)  Resp 20  Ht 5\' 10"  (1.778 m)  Wt 160 lb (  72.576 kg)  BMI 22.96 kg/m2  SpO2 98%  Physical Exam  Nursing note and vitals reviewed. Constitutional: He appears well-developed and well-nourished. No distress.  HENT:  Head: Normocephalic and atraumatic.  Eyes: EOM are normal. Pupils are equal, round, and reactive to light.  Neck: Neck supple. No tracheal deviation present.  Cardiovascular: Normal rate, regular rhythm and normal heart sounds.   Pulmonary/Chest: Effort normal and breath sounds normal. No respiratory distress. He has no wheezes.  Abdominal: Soft. He exhibits no distension.  Musculoskeletal: Normal range of motion. He exhibits no edema.       Moves all extremities with normal strength.   Neurological: He is alert. No sensory deficit.       Alert and oriented X2. Pt knew he was in hospital, but could not state the name. Able to ambulate a few steps without ataxia.     Skin: Skin is warm and dry.  Psychiatric: He has a normal mood and affect. His behavior is normal.    ED Course  Procedures (including critical care time)  DIAGNOSTIC STUDIES: Oxygen Saturation is 97% on room air, normal by my interpretation.    COORDINATION OF CARE:  11:25-Discussed planned course of treatment with the patient and wife including UA, blood work, EKG and head CT, who are agreeable at this time.   14:21-Medication Orders: Gadobenate dimeglumine (multihance) injection 15 mL-once   Results for orders placed during the hospital encounter of 08/05/12  CBC WITH DIFFERENTIAL      Component Value Range   WBC 6.9  4.0 - 10.5 K/uL   RBC 4.58  4.22 - 5.81 MIL/uL   Hemoglobin 14.1  13.0 - 17.0 g/dL   HCT 41.1  39.0 - 52.0 %   MCV 89.7  78.0 - 100.0 fL   MCH 30.8  26.0 - 34.0 pg   MCHC 34.3  30.0 - 36.0 g/dL   RDW 13.9  11.5 - 15.5 %   Platelets 200  150 - 400 K/uL   Neutrophils Relative 59  43 - 77 %   Neutro Abs 4.1  1.7 - 7.7 K/uL   Lymphocytes Relative 22  12 - 46 %   Lymphs Abs 1.5  0.7 - 4.0 K/uL   Monocytes Relative 12  3 - 12 %   Monocytes Absolute 0.9  0.1 - 1.0 K/uL   Eosinophils Relative 7 (*) 0 - 5 %   Eosinophils Absolute 0.5  0.0 - 0.7 K/uL   Basophils Relative 0  0 - 1 %   Basophils Absolute 0.0  0.0 - 0.1 K/uL  BASIC METABOLIC PANEL      Component Value Range   Sodium 141  135 - 145 mEq/L   Potassium 4.3  3.5 - 5.1 mEq/L   Chloride 106  96 - 112 mEq/L   CO2 27  19 - 32 mEq/L   Glucose, Bld 90  70 - 99 mg/dL   BUN 19  6 - 23 mg/dL   Creatinine, Ser 1.38 (*) 0.50 - 1.35 mg/dL   Calcium 9.3  8.4 - 10.5 mg/dL   GFR calc non Af Amer 50 (*) >90 mL/min   GFR calc Af Amer 58 (*) >90 mL/min  URINALYSIS, ROUTINE W REFLEX MICROSCOPIC      Component Value Range   Color, Urine YELLOW  YELLOW   APPearance CLEAR  CLEAR   Specific Gravity, Urine >1.030 (*) 1.005 - 1.030   pH 6.0  5.0 - 8.0   Glucose, UA NEGATIVE  NEGATIVE mg/dL   Hgb urine dipstick  NEGATIVE  NEGATIVE   Bilirubin Urine NEGATIVE  NEGATIVE   Ketones, ur NEGATIVE  NEGATIVE mg/dL   Protein, ur NEGATIVE  NEGATIVE mg/dL   Urobilinogen, UA 0.2  0.0 - 1.0 mg/dL   Nitrite NEGATIVE  NEGATIVE   Leukocytes, UA NEGATIVE  NEGATIVE     Ct Head Wo Contrast  08/05/2012  *RADIOLOGY REPORT*  Clinical Data: Left-sided weakness  CT HEAD WITHOUT CONTRAST  Technique:  Contiguous axial images were obtained from the base of the skull through the vertex without contrast.  Comparison: 09/18/2011  Findings: There is a mixed density mass within the left posterior parietal/occipital lobe.  This measures 2.1 x 2.9 cm and is new when compared with the previous examination.  The surrounding low density edema is identified.  There is diffuse patchy low density throughout the subcortical and periventricular white matter consistent with chronic small vessel ischemic change.  There is prominence of the sulci and ventricles consistent with brain atrophy.  There is no evidence for intracranial hemorrhage.  No significant midline shift.  The paranasal sinuses and mastoid air cells are clear.  The skull is intact.  IMPRESSION:  1.  Indeterminant, mixed density structure within the left posterior cerebral hemisphere. Cannot rule out tumor.  This may be primary or metastatic to the brain.  Recommend further evaluation with contrast enhanced MRI. 2.  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  These results will be called to the ordering clinician or representative by the Radiologist Assistant, and communication documented in the PACS Dashboard.  Original Report Authenticated By: Angelita Ingles, M.D.   Mr Jeri Cos [id number] Contrast  08/05/2012  *RADIOLOGY REPORT*  Clinical Data: Left-sided weakness.  Abnormal CT. Hypertensive hyperlipidemic patient.  No history of cancer.  MRI HEAD WITHOUT AND WITH CONTRAST  Technique:  Multiplanar, multiecho pulse sequences of the brain and surrounding structures were obtained according to  standard protocol without and with intravenous contrast  Contrast: 24mL MULTIHANCE GADOBENATE DIMEGLUMINE 529 MG/ML IV SOLN  Comparison: 08/05/2012 CT and 09/18/2011 MR.  Findings: Moderate size left occipital - parietal lobe subacute partially hemorrhagic infarct.  This is felt unlikely to represent tumor and can be reassessed on follow-up if the patient had progressive symptoms to confirm this impression of infarction.  When compared to the prior MR, progressive patchy white matter type changes and compliment periventricular white matter changes with progressive of left frontal subcortical white matter type changes most consistent with result of small vessel disease given the patient's history of hypertension and hyperlipidemia.  Remote right thalamic infarct and left corona radiata infarct.  No acute right hemispheric infarct to explain the patient's left- sided symptoms.  Global atrophy without hydrocephalus.  Right vertebral artery is diminutive in size and possibly narrowed/occluded.  Remainder of the major intracranial vascular structures are patent.  Partially empty sella incidentally noted.  IMPRESSION:  Moderate size left occipital - parietal lobe subacute partially hemorrhagic infarct.  This is felt unlikely to represent tumor and can be reassessed on follow-up if the patient had progressive symptoms to confirm this impression of infarction.  Progressive small vessel disease type changes as noted above.  Remote small right thalamic infarct and left corona radiata infarct.  No acute right hemispheric infarct to explain the patient's left- sided symptoms.  Global atrophy without hydrocephalus.  Right vertebral artery is diminutive in size and possibly narrowed/occluded.  Original Report Authenticated By: Doug Sou, M.D.     No  diagnosis found.  Date: 08/05/2012  Rate: 67  Rhythm: normal sinus rhythm  QRS Axis: normal  Intervals: normal  ST/T Wave abnormalities: normal  Conduction  Disutrbances:none  Narrative Interpretation:   Old EKG Reviewed: changes noted c SA   MDM  MRI scan suggests moderate left-sided occipital parietal subacute hemorrhagic infarct.  This was discussed with patient and his wife. Also discussed with the general medicine.  Admit   I personally performed the services described in this documentation, which was scribed in my presence. The recorded information has been reviewed and considered.       Nat Christen, MD 08/05/12 1550

## 2012-08-05 NOTE — ED Notes (Signed)
Pt returned from [facility], wife at bedside.

## 2012-08-05 NOTE — ED Notes (Signed)
Pt awaiting hospitalist for admission.

## 2012-08-05 NOTE — Progress Notes (Unsigned)
GENERAL INTELLECTUAL FUNCTIONING:  The patient was initially administered the CERAD Neuropsychological Battery. Below are the patient's scores in relationship to normative data and mild/moderate Senile Dementia of the Alzheimer's Type.      VF  Name  MMSE  Const  [facility]  WLR  [facility]  Patient:  08/02/12 8 15 25 5 12 2 7   Patient: 02/24/12 11  15  25  8  13  2  8    Normal Age Matched  18  14.6  28.9  10.1  21.1  7.2  9.6   Mild SDAT: 8.8 11.8 20 7.8 8.8 0.9 4.8   Moderate SDAT 6.8 10.5 16.1 6.5 6.3 0.4 3.9   On the CERAD battery, which is a battery of measures shown to be sensitive to various forms of dementia, the patient's performance was mildly impaired and below the normative expectations but just above the group of Alzheimer's dementia. Behavioral observations indicate that the patient fully in gauge during the testing procedure but was somewhat anxious about doing well and quickly came up with excuses if he felt he had done poorly on a particular task. This is consistent with his complaints and reports of difficulties with anxiety in general. His performance on the Mini-Mental State Exam indicates mild difficulties with time as he felt that the day of the week was Tuesday when in fact it was Wednesday but was correct as to year, season, and date. He was not able to recall my name or the name of the facility he was sent.Marland Kitchen He was able to immediately recall 3 objects and was able to recall those 3 objects after a brief delay. He was able to spell world forwards and also able to spell "world" backwards. There were some indications of visual spatial difficulties noted. He was able to carry out a brief series of verbal instructions. His performance on the constructional praxis test, a test sensitive to cortically based neurological deficits, was mildly impaired and should some difficulties with more complex planning and visual spatial abilities.  The patient's performance on the Verbal Fluency Test was mildly  impaired and consistent with the mild Alzheimer's population group. His ability to name pictures of common and uncommon objects as measured by the [facility] Naming Test was within normal limits and suggests good targeted naming and daily naming abilities.  The patient's verbal learning and ability to encode new information was mildly impaired and below age matched normative population groups and just above those levels achieved by individuals in the mild Alzheimer's comparison group. When words were initially presented over three separate trials, his immediate recall was mildly impaired as he recalled 13 of the possible 30 words. He was only able to recall 2 of the original 10 words after a brief delay, which is significantly impaired and consistent with the mild Alzheimer's comparison group. When the task was changed to a recognition task, he correctly identified 8 of the original 0 words and correctly identified 9 of the 10 words that were not on the original list. This pattern suggests mild difficulties in coding and storing not a tour information but does suggest that queuing helps with memory recall.  Overall, this performance indicates that the patient is consistently performing below age matched normative expectations but consistently slightly above the mild Alzheimer's population on almost all measures with the exception of targeted and daily mean abilities. The patient showed some difficulties with verbal fluency, learning new information, or complicated visual spatial abilities and in particular free recall  of newly lower in information. Cuing and targeted recall did appear to be better. Most of these performances were more consistent with a mild Alzheimer's population group and they were within normative comparison group.  [place] and Conclusions:  The results of the current neuropsychological assessment which administered a broad range of various cognitive testing to be most sensitive to the  early cognitive changes produced with a cortical dementia were generally consistent with some very early signs and mild symptoms related to conditions such as Alzheimer's dementia. However, the patient's performance were not quite to the level of this mild Alzheimer's population and therefore the results suggesting this possibility should be interpreted with caution. It is clear that the patient performed below expected levels based on normative values which is clear to suggest that there are some mild cognitive deficits. However, the patient has a history of anxiety and it is clear that he was very anxious and this testing procedure. It is possible that anxiety at a significant level could produce some mild cognitive weaknesses but I do not think he could explain the totality of this performance. We will need to conduct repeat assessment in 6-12 months to assess for progressive nature to these changes and to gain more confidence in the diagnostic suggestions. While the patient did show mild cognitive deficits these were not to the level that would limit his ability to make valid and competent decisions about his basic life. I do encourage the patient and his family to work on some long-term planning at this point while there are no significant pain clearly disruptive deficits at this point. We will reassess the patient in 6-12 months to assess for progressive decline.

## 2012-08-05 NOTE — H&P (Addendum)
[facility] History and Physical  MESSI SHENEFIELD [id number] DOB: 1941/09/15 DOA: 08/05/2012   PCP: Glo Herring., MD   Chief Complaint: Altered mental status, difficulty in gait.  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 At one time his left arm and hand became clumsy and then also in the parking lot, he was not able to walk in a straight line, seemed to be walking around in circles. His speech was not effected and his mentation was somewhat confused according to other witnesses and family members. Since being in the emergency room, his symptoms have improved significantly. MRI brain scan is consistent with a CVA. He is not entirely clear which side was affected the left of the right. Review of Systems:  Apart from history of present illness, other systems negative.  Past Medical History  Diagnosis Date  . HTN (hypertension)   . Memory changes   . Back pain   . GERD (gastroesophageal reflux disease)   . Stroke    History reviewed. No pertinent past surgical history. Social History:  Is married, lives with his wife. He does not drink alcohol. He is a smoker.   No Known Allergies  Family history: no family history of note for disease.   Prior to Admission medications   Medication Sig Start Date End Date Taking? Authorizing Provider  albuterol (PROVENTIL HFA;VENTOLIN HFA) 108 (90 BASE) MCG/ACT inhaler Inhale 2 puffs into the lungs every 6 (six) hours as needed. For shortness of breath   Yes Historical Provider, MD  amLODipine (NORVASC) 5 MG tablet Take 5 mg by mouth every morning.    Yes Historical Provider, MD  atorvastatin (LIPITOR) 40 MG tablet Take 40 mg by mouth at bedtime.    Yes Historical Provider, MD  cetirizine (ZYRTEC) 10 MG tablet Take 10 mg by mouth every morning.   Yes Historical Provider, MD  divalproex (DEPAKOTE ER)  500 MG 24 hr tablet Take 500-1,000 mg by mouth 2 (two) times daily. Take 1 tablet in the morning and 2 tablets in the evening 07/05/12  Yes Kathlee Nations, MD  donepezil (ARICEPT) 10 MG tablet Take 10 mg by mouth at bedtime.    Yes Historical Provider, MD  Oxycodone HCl 10 MG TABS Take 10 mg by mouth 4 (four) times daily as needed. For pain   Yes Historical Provider, MD  zolpidem (AMBIEN) 10 MG tablet Take 10 mg by mouth at bedtime as needed. For sleep   Yes Historical Provider, MD   Physical Exam: Filed Vitals:   08/05/12 1121 08/05/12 1212 08/05/12 1446  BP:  133/80 140/86  Pulse:  94 72  Temp:  97.8 F (36.6 C)   TempSrc:  Oral   Resp:  20 20  Height: 5\' 10"  (1.778 m)    Weight: 72.576 kg (160 lb)    SpO2:  97% 98%     General:  He looks systemically well, alert and orientated.  Eyes: No jaundice, no pallor.  ENT: No abnormalities.  Neck: No lymphadenopathy.  Cardiovascular: Heart sounds are present and normal without murmurs. He is in sinus rhythm.  Respiratory: Lung fields are clear.  Abdomen: Soft, nontender, no hepatosplenomegaly.  Skin: Within normal limits. No rash.  Musculoskeletal: No abnormalities.  Psychiatric: Appropriate affect.  Neurologic: Alert and orientated. Surprisingly, he does not have any focal neurological signs.  Labs on Admission:  Basic Metabolic  Panel:  Lab 08/05/12 1154  NA 141  K 4.3  CL 106  CO2 27  GLUCOSE 90  BUN 19  CREATININE 1.38*  CALCIUM 9.3  MG --  PHOS --       CBC:  Lab 08/05/12 1154  WBC 6.9  NEUTROABS 4.1  HGB 14.1  HCT 41.1  MCV 89.7  PLT 200      Radiological Exams on Admission: Ct Head Wo Contrast  08/05/2012  *RADIOLOGY REPORT*  Clinical Data: Left-sided weakness  CT HEAD WITHOUT CONTRAST  Technique:  Contiguous axial images were obtained from the base of the skull through the vertex without contrast.  Comparison: 09/18/2011  Findings: There is a mixed density mass within the left posterior  parietal/occipital lobe.  This measures 2.1 x 2.9 cm and is new when compared with the previous examination.  The surrounding low density edema is identified.  There is diffuse patchy low density throughout the subcortical and periventricular white matter consistent with chronic small vessel ischemic change.  There is prominence of the sulci and ventricles consistent with brain atrophy.  There is no evidence for intracranial hemorrhage.  No significant midline shift.  The paranasal sinuses and mastoid air cells are clear.  The skull is intact.  IMPRESSION:  1.  Indeterminant, mixed density structure within the left posterior cerebral hemisphere. Cannot rule out tumor.  This may be primary or metastatic to the brain.  Recommend further evaluation with contrast enhanced MRI. 2.  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  These results will be called to the ordering clinician or representative by the Radiologist Assistant, and communication documented in the PACS Dashboard.  Original Report Authenticated By: Angelita Ingles, M.D.   Mr Jeri Cos [id number] Contrast  08/05/2012  *RADIOLOGY REPORT*  Clinical Data: Left-sided weakness.  Abnormal CT. Hypertensive hyperlipidemic patient.  No history of cancer.  MRI HEAD WITHOUT AND WITH CONTRAST  Technique:  Multiplanar, multiecho pulse sequences of the brain and surrounding structures were obtained according to standard protocol without and with intravenous contrast  Contrast: 5mL MULTIHANCE GADOBENATE DIMEGLUMINE 529 MG/ML IV SOLN  Comparison: 08/05/2012 CT and 09/18/2011 MR.  Findings: Moderate size left occipital - parietal lobe subacute partially hemorrhagic infarct.  This is felt unlikely to represent tumor and can be reassessed on follow-up if the patient had progressive symptoms to confirm this impression of infarction.  When compared to the prior MR, progressive patchy white matter type changes and compliment periventricular white matter changes with progressive of  left frontal subcortical white matter type changes most consistent with result of small vessel disease given the patient's history of hypertension and hyperlipidemia.  Remote right thalamic infarct and left corona radiata infarct.  No acute right hemispheric infarct to explain the patient's left- sided symptoms.  Global atrophy without hydrocephalus.  Right vertebral artery is diminutive in size and possibly narrowed/occluded.  Remainder of the major intracranial vascular structures are patent.  Partially empty sella incidentally noted.  IMPRESSION:  Moderate size left occipital - parietal lobe subacute partially hemorrhagic infarct.  This is felt unlikely to represent tumor and can be reassessed on follow-up if the patient had progressive symptoms to confirm this impression of infarction.  Progressive small vessel disease type changes as noted above.  Remote small right thalamic infarct and left corona radiata infarct.  No acute right hemispheric infarct to explain the patient's left- sided symptoms.  Global atrophy without hydrocephalus.  Right vertebral artery is diminutive in size and possibly narrowed/occluded.  Original Report Authenticated By: Doug Sou, M.D.    Assessment/Plan Principal Problem:  *CVA (cerebral infarction) Active Problems:  HTN (hypertension)   1. Acute CVA. Probably related to hypertension. Admit to telemetry floor. Monitor blood pressure. Stroke workup. Neurology consultation. 2. Hypertension. 3. 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  Code Status: Full code. Family Communication: Discuss plan with patient and wife at the bedside. Disposition Plan: Home when medically stable.  Time spent: 30 minutes  [facility] Pager [phone].  If 7PM-7AM, please contact night-coverage [email or web address] Password [email or web address] 08/05/2012, 4:06 PM

## 2012-08-05 NOTE — ED Notes (Signed)
Pt to mri 

## 2012-08-05 NOTE — ED Notes (Signed)
Pt returned from ct scan, alert and resting in bed at present.

## 2012-08-05 NOTE — ED Notes (Signed)
Pt cont to be out of department. Wife escorted to room 327.

## 2012-08-05 NOTE — ED Notes (Signed)
Pt stood unassisted by staff to use urinal.  Pt tolerated, denies any dizziness or weakness

## 2012-08-05 NOTE — ED Notes (Signed)
Pt resting in bed, resp even and unlabored 

## 2012-08-05 NOTE — Progress Notes (Signed)
GENERAL INTELLECTUAL FUNCTIONING:  The patient was initially administered the CERAD Neuropsychological Battery on 02/24/2012.  This battery was administered today to assess for any progressive changes to further facilitate and differential diagnoses.  Up to this point, there have been objective findings of neuropsychological deficits but do to history of cerebrovascular issues and previous mild stroke a full differentiation from Alzheimer's versus cerebrovascular disease could not be ascertained. The purpose of the current evaluation is to assess for any progressive changes that may have taken place. If no significant changes are observed is much more likely that the ongoing cognitive difficulties and behavioral difficulties are related to vascular issues and not a progressive cortical dementia such as Alzheimer's. Below are the patient's scores in relationship to normative data and mild/moderate Senile Dementia of the Alzheimer's Type.         VF   Name  MMSE  Const  [facility]  [facility]  Johnnette Barrios  Karlos Malina:  08/02/2012   8   15  25  5  12  2  7    Patient: 02/24/12    11  15  25  8  13  2  8    Normal Age Matched    18  14.6  28.9  10.1  21.1  7.2  9.6   Mild SDAT:     8.8  11.8  20  7.8  8.8  0.9  4.8   Moderate SDAT    6.8  10.5  16.1  6.5  6.3  0.4  3.9   On the CERAD battery, which is a battery of measures shown to be sensitive to various forms of dementia, the patient's performance continued to show mild impairments on number of areas that were below normative expectations. However, over the preceding 6 months the patient has not shown any significant change in his performance. He is neither significantly improved his performance or shown indications of his performance deteriorated. Therefore, the results of the current neuropsychological evaluation are strongly supportive of the patient's cognitive and neuropsychological deficits been the result of specific vascular issues either related to significant small  vessel disease and/or multi-infarct dementia. A full description of his performances but previously as well as current performances can be found 02/24/12 nodes. His performances were almost identical to the previous measures.    [place] and Conclusions:  The results of the current neuropsychological assessment specifically as well as direct comparisons to the identical cognitive measures been present 6 months ago are consistent with significant objective findings of cognitive deficits. These are consistent with verbal fluency deficits, constructional deficits and memory impairments. This would suggest executive functioning deficits are directly related related to some changes in frontal lobe functioning as well as parietal lobe and occipital lobe deficits related to the visual spatial deficits. Given the fact that he has had a previous stroke and that he continues to smoke and lacks physical activity or the desire to be very careful about his diet is very likely that these cerebrovascular deficits we'll continue to progress. However, they're likely to be more of a stepwise change with a plateau of functioning for some time and then sudden changes related to small strokes with risk of or strokes. Progressive small vessel disease some cortical he is also likely due to his medical condition. However, at this point there are no indications of patterns indicative of Alzheimer's as this condition would have shown progressive changes from the previous testing.

## 2012-08-06 DIAGNOSIS — Z720 Tobacco use: Secondary | ICD-10-CM | POA: Diagnosis present

## 2012-08-06 DIAGNOSIS — J449 Chronic obstructive pulmonary disease, unspecified: Secondary | ICD-10-CM | POA: Diagnosis not present

## 2012-08-06 DIAGNOSIS — F39 Unspecified mood [affective] disorder: Secondary | ICD-10-CM | POA: Diagnosis not present

## 2012-08-06 DIAGNOSIS — E785 Hyperlipidemia, unspecified: Secondary | ICD-10-CM | POA: Diagnosis present

## 2012-08-06 DIAGNOSIS — I10 Essential (primary) hypertension: Secondary | ICD-10-CM | POA: Diagnosis not present

## 2012-08-06 DIAGNOSIS — I6350 Cerebral infarction due to unspecified occlusion or stenosis of unspecified cerebral artery: Secondary | ICD-10-CM | POA: Diagnosis not present

## 2012-08-06 LAB — COMPREHENSIVE METABOLIC PANEL
Alkaline Phosphatase: 65 U/L (ref 39–117)
BUN: 19 mg/dL (ref 6–23)
Calcium: 9 mg/dL (ref 8.4–10.5)
Creatinine, Ser: 1.33 mg/dL (ref 0.50–1.35)
GFR calc Af Amer: 60 mL/min — ABNORMAL LOW (ref >90)
Glucose, Bld: 89 mg/dL (ref 70–99)
Total Protein: 6 g/dL (ref 6.0–8.3)

## 2012-08-06 LAB — HEMOGLOBIN A1C: Mean Plasma Glucose: 131 mg/dL — ABNORMAL HIGH (ref <117)

## 2012-08-06 LAB — LIPID PANEL
Cholesterol: 169 mg/dL (ref 0–200)
HDL: 38 mg/dL — ABNORMAL LOW (ref >39)
Total CHOL/HDL Ratio: 4.4 RATIO
Triglycerides: 258 mg/dL — ABNORMAL HIGH (ref <150)

## 2012-08-06 MED ORDER — ASPIRIN EC 81 MG PO TBEC
81.0000 mg | DELAYED_RELEASE_TABLET | Freq: Every day | ORAL | Status: DC
  Administered 2012-08-06 – 2012-08-09 (×4): 81 mg via ORAL
  Filled 2012-08-06 (×4): qty 1

## 2012-08-06 NOTE — Progress Notes (Signed)
[facility] PROGRESS NOTE  Glenn [name] [id number] DOB: 03/31/1941 DOA: 08/05/2012 PCP: Glo Herring., MD  Assessment/Plan: Principal Problem:  *CVA (cerebral infarction) Active Problems:  HTN (hypertension)  COPD (chronic obstructive pulmonary disease)  Tobacco abuse  Memory difficulties  Hyperlipidemia  1. Subacute CVA. Patient was found to have a subacute left occipital parietal CVA. He does have a stroke in the past approximately 12 years ago. He reports being on aspirin but has been off aspirin for the past one year. It is unclear as to why this was stopped. He will be restarted on aspirin. Carotid Dopplers do not show any significant stenosis. 2-D echocardiogram is pending and will need to be done prior to discharge. Neurology consult is also pending. 2. Hypertension. Continue outpatient regimen 3. COPD. Stable. Patient was strongly advised to quit smoking 4. Memory difficulties. 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 At one time his left arm and hand became clumsy and then also in the parking lot, he was not able to walk in a straight line, seemed to be walking around in circles. His speech was not effected and his mentation was somewhat confused according to other witnesses and family members.  Since being in the emergency room, his symptoms have improved significantly. MRI brain scan is consistent with a CVA. He is not entirely clear which side was affected the left of the right.   Consultants:  Nephrology consult pending  Procedures:  None  Antibiotics:  None  HPI/Subjective: Patient is feeling better. Feels  back to normal. Does not have any complaints  Objective: Filed Vitals:   08/05/12 2159 08/06/12 0139 08/06/12 0400 08/06/12 0607  BP: 160/73 121/78 105/63 126/77  Pulse: 59 71 74 58  Temp: 98 F (36.7 C) 98.1 F (36.7 C) 97.2 F (36.2 C) 97.5 F (36.4 C)  TempSrc: Axillary Oral Oral Oral  Resp: 17 18 18 19   Height:      Weight:      SpO2: 93% 97% 95% 94%    Intake/Output Summary (Last 24 hours) at 08/06/12 1317 Last data filed at 08/06/12 0626  Gross per 24 hour  Intake   1049 ml  Output    400 ml  Net    649 ml   Filed Weights   08/05/12 1121 08/05/12 1835  Weight: 72.576 kg (160 lb) 72.4 kg (159 lb 9.8 oz)    Exam:   General:  No acute distress  Cardiovascular: S1, S2, regular rate and rhythm  Respiratory: Clear to auscultation bilaterally  Abdomen: Soft, nontender, nondistended, bowel sounds active  Data Reviewed: Basic Metabolic Panel:  Lab [id number] 08/05/12 1154  NA 142 141  K 3.9 4.3  CL 109 106  CO2 24 27  GLUCOSE 89 90  BUN 19 19  CREATININE 1.33 1.38*  CALCIUM 9.0 9.3  MG -- --  PHOS -- --   Liver Function Tests:  Lab 08/06/12 0455  AST 32  ALT 18  ALKPHOS 65  BILITOT 0.2*  PROT 6.0  ALBUMIN 2.7*   No results found for this basename: LIPASE:5,AMYLASE:5 in the last 168 hours No results found for this basename: AMMONIA:5 in the last 168 hours CBC:  Lab  08/05/12 1154  WBC 6.9  NEUTROABS 4.1  HGB 14.1  HCT 41.1  MCV 89.7  PLT 200   Cardiac Enzymes: No results found for this basename: CKTOTAL:5,CKMB:5,CKMBINDEX:5,TROPONINI:5 in the last 168 hours BNP (last 3 results) No results found for this basename: PROBNP:3 in the last 8760 hours CBG: No results found for this basename: GLUCAP:5 in the last 168 hours  No results found for this or any previous visit (from the past 240 hour(s)).   Studies: Ct Abdomen Wo Contrast  2012/07/20  *RADIOLOGY REPORT*  Clinical Data:  Right upper quadrant abdominal pain.  Hypertension.  CT  ABDOMEN WITHOUT CONTRAST  Technique:  Multidetector CT imaging of the abdomen was performed following the standard protocol without IV contrast.  Comparison:  Multiple exams, including 11/26/2011  Findings:  Airway plugging and atelectasis noted in the right lower lobe. The visualized portion of the liver, spleen, pancreas, and adrenal glands appear unremarkable in noncontrast CT appearance.  The gallbladder is mildly contracted.  Considerable dextroconvex lumbar scoliosis noted with rotary component. Considerable spondylosis and degenerative disc disease noted with suspected impingement at L2-3, L3-4, L4-5, and L5-S1.  4 cm right renal cyst noted with peripheral calcification along the posterior margin on image 26 of series 2.  The cyst measures -2 HU in density.  There is a 3 mm right kidney lower pole nonobstructive calculus. Adjacent to this is a hypodense lesion along the renal collecting system measuring -2 HU, statistically likely to be a cyst.  Aortoiliac atherosclerotic calcification noted. No pathologic retroperitoneal or porta hepatis adenopathy is identified.  Orally administered contrast noted in the visualized small bowel and colon.  The pelvis was not imaged on today's examination.  IMPRESSION:  1.  Nonobstructive right nephrolithiasis. 2.  Right renal cysts as shown on prior ultrasound of 11/26/2011. The larger cyst has posterior rim calcifications and accordingly is probably a Bosniak category II cyst (although IV contrast was not administered). 3.  Lumbar scoliosis and spondylosis, with multilevel impingement. 4.  Airway plugging and atelectasis in the right lower lobe.  Original Report Authenticated By: Carron Curie, M.D.   Dg Chest 2 View  08/05/2012  *RADIOLOGY REPORT*  Clinical Data: Hypertension, hyperlipidemia, left-sided weakness  CHEST - 2 VIEW  Comparison: Chest radiograph 11/27/2011  Findings: Normal cardiac silhouette.  Aorta is ectatic.  No effusion, infiltrate, or  pneumothorax.  There is flattening of the hemidiaphragms. Degenerative osteophytosis of the thoracic spine. There is a compression deformities of the mid thoracic spine which are progressed when compared to prior.  IMPRESSION:  1.   No acute cardiopulmonary findings.  2.  Hyperinflated lungs.  3.   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  Original Report Authenticated By: Suzy Bouchard, M.D.   Ct Head Wo Contrast  08/05/2012  *RADIOLOGY REPORT*  Clinical Data: Left-sided weakness  CT HEAD WITHOUT CONTRAST  Technique:  Contiguous axial images were obtained from the base of the skull through the vertex without contrast.  Comparison: 09/18/2011  Findings: There is a mixed density mass within the left posterior parietal/occipital lobe.  This measures 2.1 x 2.9 cm and is new when compared with the previous examination.  The surrounding low density edema is identified.  There is diffuse patchy low density throughout the subcortical and periventricular white matter consistent with chronic small vessel ischemic change.  There is prominence of the sulci and ventricles consistent with brain atrophy.  There is no evidence for intracranial hemorrhage.  No significant midline shift.  The  paranasal sinuses and mastoid air cells are clear.  The skull is intact.  IMPRESSION:  1.  Indeterminant, mixed density structure within the left posterior cerebral hemisphere. Cannot rule out tumor.  This may be primary or metastatic to the brain.  Recommend further evaluation with contrast enhanced MRI. 2.  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  These results will be called to the ordering clinician or representative by the Radiologist Assistant, and communication documented in the PACS Dashboard.  Original Report Authenticated By: Angelita Ingles, M.D.   Mr Jodene Nam Head Wo Contrast  08/05/2012  *RADIOLOGY REPORT*  Clinical Data: 71 year old male with acute left brain stroke. Weakness, hypertension.  MRA HEAD  WITHOUT CONTRAST  Technique: Angiographic images of the Circle of Willis were obtained using MRA technique without intravenous contrast.  Comparison: Brain MRI 08/05/2012.  Findings: Dominant appearing distal left vertebral artery with antegrade flow supplies the basilar.  Diminutive distal right vertebral artery terminates in the right PICA.  Dominant appearing left AICA.  No basilar artery stenosis.  SCA and PCA origins are within normal limits.  Bilateral P1 P2 segments are within normal limits.  No convincing PCA branch occlusion, distal branches partially affected by MOTSA artifact.  Posterior communicating arteries are diminutive or absent.  Antegrade flow in both ICA siphons.  Venous contamination (from earlier contrast administration) occurs.  No definite hemodynamically significant ICA stenosis.  Carotid termini are patent.  MCA and right ACA origins are within normal limits.  The left A1 segment is nondominant.  Anterior communicating artery and visualized ACA branches are within normal limits.  Visualized right MCA branches are within normal limits.  On the left the M1 segment and MCA bifurcation are patent.  There is decreased flow in the proximal aspect of a dominant posterior left sylvian division (series 17 image 11).  There is preserved more distal flow in the vessel.  This does occur at [facility] interface.  IMPRESSION: 1.  Questionable high-grade stenosis or partial occlusion of the proximal left MCA posterior sylvian M2 branch.  Alternatively this may be due to artifact. 2.  No definite PCA branch occlusion. 3.  No proximal intracranial stenosis. 4.  Study signal to noise affected by earlier contrast administration.  Original Report Authenticated By: Randall An, M.D.   Mr Jeri Cos [id number] Contrast  08/05/2012  *RADIOLOGY REPORT*  Clinical Data: Left-sided weakness.  Abnormal CT. Hypertensive hyperlipidemic patient.  No history of cancer.  MRI HEAD WITHOUT AND WITH CONTRAST  Technique:  Multiplanar,  multiecho pulse sequences of the brain and surrounding structures were obtained according to standard protocol without and with intravenous contrast  Contrast: 26mL MULTIHANCE GADOBENATE DIMEGLUMINE 529 MG/ML IV SOLN  Comparison: 08/05/2012 CT and 09/18/2011 MR.  Findings: Moderate size left occipital - parietal lobe subacute partially hemorrhagic infarct.  This is felt unlikely to represent tumor and can be reassessed on follow-up if the patient had progressive symptoms to confirm this impression of infarction.  When compared to the prior MR, progressive patchy white matter type changes and compliment periventricular white matter changes with progressive of left frontal subcortical white matter type changes most consistent with result of small vessel disease given the patient's history of hypertension and hyperlipidemia.  Remote right thalamic infarct and left corona radiata infarct.  No acute right hemispheric infarct to explain the patient's left- sided symptoms.  Global atrophy without hydrocephalus.  Right vertebral artery is diminutive in size and possibly narrowed/occluded.  Remainder of the major intracranial vascular structures are  patent.  Partially empty sella incidentally noted.  IMPRESSION:  Moderate size left occipital - parietal lobe subacute partially hemorrhagic infarct.  This is felt unlikely to represent tumor and can be reassessed on follow-up if the patient had progressive symptoms to confirm this impression of infarction.  Progressive small vessel disease type changes as noted above.  Remote small right thalamic infarct and left corona radiata infarct.  No acute right hemispheric infarct to explain the patient's left- sided symptoms.  Global atrophy without hydrocephalus.  Right vertebral artery is diminutive in size and possibly narrowed/occluded.  Original Report Authenticated By: Doug Sou, M.D.   US Carotid Duplex Bilateral  08/05/2012  *RADIOLOGY REPORT*  Clinical Data: Subacute  hemorrhagic infarct in the left parieto- occipital lobe. Long-time smoker with history of hypertension.  BILATERAL CAROTID DUPLEX ULTRASOUND  Technique: Pearline Cables scale imaging, color Doppler and duplex ultrasound was performed of bilateral carotid and vertebral arteries in the neck.  Comparison:  None.  Criteria:  Quantification of carotid stenosis is based on velocity parameters that correlate the residual internal carotid diameter with NASCET-based stenosis levels, using the diameter of the distal internal carotid lumen as the denominator for stenosis measurement.  The following velocity measurements were obtained:                   PEAK SYSTOLIC/END DIASTOLIC RIGHT ICA:                        111/16cm/sec CCA:                        0000000 SYSTOLIC ICA/CCA RATIO:     [id number] DIASTOLIC ICA/CCA RATIO:    2.01 ECA:                        120/12cm/sec  LEFT ICA:                        101/15cm/sec CCA:                        [id number] SYSTOLIC ICA/CCA RATIO:     [id number] DIASTOLIC ICA/CCA RATIO:    1.36 ECA:                        102/12cm/sec  Findings:  RIGHT CAROTID ARTERY: Moderate intimal thickening involving the CCA, with scattered calcified and noncalcified plaques along the mid and distal CCA.  Moderate predominately noncalcified plaque in the carotid bulb and proximal ICA and ECA.  No visible stenosis of greater than 50% diameter at gray scale or color imaging.  Mild spectral broadening involving the ICA waveform.  RIGHT VERTEBRAL ARTERY:  Antegrade flow with normal waveform.  LEFT CAROTID ARTERY: Moderate intimal thickening involving the CCA. Moderate to severe predominately noncalcified plaque involving the distal CCA, bulb, and proximal ICA and ECA.  Possible stenosis involving the proximal ECA at gray scale and color imaging.  No visible stenosis involving the proximal ICA at gray scale and color imaging, though a calcified plaque anteriorly shadows a small portion of the proximal ICA.  Mild spectral  broadening involving the ICA waveform.  LEFT VERTEBRAL ARTERY:  Antegrade flow with normal waveform.  IMPRESSION:  1.  No evidence of hemodynamically significant stenosis involving either the right or left CCA or ICA by Doppler criteria.  One caveat is that there  is a calcified plaque in the anterior wall of the proximal left ICA which obscures a small portion of this vessel. 2.  Moderate to severe predominately noncalcified plaque involving the distal left CCA and proximal left ICA. 3.  Mild to moderate predominately noncalcified plaque involving the right carotid bulb and proximal ICA. 4.  Antegrade flow in both vertebral arteries.  Original Report Authenticated By: Deniece Portela, M.D.    Scheduled Meds:    . amLODipine  5 mg Oral q morning - 10a  . atorvastatin  40 mg Oral QHS  . divalproex  1,000 mg Oral QHS  . divalproex  500 mg Oral Daily  . donepezil  10 mg Oral QHS  . loratadine  10 mg Oral Daily  . DISCONTD: divalproex  500 mg Oral Daily  . DISCONTD: divalproex  500-1,000 mg Oral BID  . DISCONTD: heparin  5,000 Units Subcutaneous Q8H   Continuous Infusions:    . sodium chloride 50 mL/hr at 08/06/12 1021    Principal Problem:  *CVA (cerebral infarction) Active Problems:  HTN (hypertension)  COPD (chronic obstructive pulmonary disease)  Tobacco abuse  Memory difficulties  Hyperlipidemia    Time spent: 30 minutes    [facility] Pager [phone]. If 7PM-7AM, please contact night-coverage at [email or web address], password [facility] 08/06/2012, 1:17 PM  LOS: 1 day

## 2012-08-06 NOTE — Evaluation (Signed)
Physical Therapy Evaluation Patient Details Name: JAICEION HAGEN MRN: [id number] DOB: [birth year] Today's Date: 08/06/2012 Time: 0910-0949 PT Time Calculation (min): 39 min  PT Assessment / Plan / Recommendation Clinical Impression  Pt reports no residual sx.due to stroke.  There was a mild deficit of very high level balance found, but one could question if this is much different from many in his age bracket.  He had no difficulty walking or climbing stairs and no instability was detected.  I see no reason for any PT intervention.    PT Assessment  Patent does not need any further PT services    Follow Up Recommendations  No PT follow up    Barriers to Discharge        Equipment Recommendations  None recommended by PT    Recommendations for Other Services     Frequency      Precautions / Restrictions Precautions Precautions: None Restrictions Weight Bearing Restrictions: No   Pertinent Vitals/Pain       Mobility  Bed Mobility Bed Mobility: Supine to Sit;Sit to Supine Supine to Sit: 7: Independent Sit to Supine: 7: Independent Transfers Transfers: Sit to Stand;Stand to Sit Sit to Stand: 7: Independent;With upper extremity assist;From bed Stand to Sit: 7: Independent;Without upper extremity assist;To bed Ambulation/Gait Ambulation/Gait Assistance: 7: Independent Assistive device: None Gait Pattern: Within Functional Limits Stairs: Yes Stairs Assistance: 7: Independent Stair Management Technique: One rail Right;Forwards;Alternating pattern Number of Stairs: 3  Wheelchair Mobility Wheelchair Mobility: No Modified Rankin (Stroke Patients Only) Pre-Morbid Rankin Score: No symptoms Modified Rankin: No symptoms    Exercises     PT Diagnosis:    PT Problem List:   PT Treatment Interventions:     PT Goals    Visit Information  Last PT Received On: 08/06/12    Subjective Data  Subjective: I feel fine Patient Stated Goal: return home   Prior [place] Lives With: Spouse Available Help at Discharge: Family Type of Home: House Home Access: Stairs to enter Technical brewer of Steps: 4 Entrance Stairs-Rails: Right Home Layout: Multi-level;Able to live on main level with bedroom/bathroom Bathroom Shower/Tub: Chiropodist: Standard Home Adaptive Equipment: None Prior Function Level of Independence: Independent Able to Take Stairs?: Yes Driving: Yes Vocation: Retired Corporate investment banker: No difficulties    Cognition  Overall Cognitive Status: Appears within functional limits for tasks assessed/performed Arousal/Alertness: Awake/alert Orientation Level: Appears intact for tasks assessed Behavior During Session: [facility] for tasks performed    Extremity/Trunk Assessment Right Upper Extremity Assessment RUE ROM/Strength/Tone: Within functional levels RUE Sensation: WFL - Light Touch;WFL - Proprioception RUE Coordination: WFL - gross motor Left Upper Extremity Assessment LUE ROM/Strength/Tone: Within functional levels LUE Sensation: WFL - Light Touch;WFL - Proprioception LUE Coordination: WFL - gross motor Right Lower Extremity Assessment RLE ROM/Strength/Tone: Within functional levels RLE Sensation: WFL - Light Touch;WFL - Proprioception RLE Coordination: WFL - gross motor Left Lower Extremity Assessment LLE ROM/Strength/Tone: Within functional levels LLE Sensation: WFL - Light Touch;WFL - Proprioception LLE Coordination: WFL - gross motor Trunk Assessment Trunk Assessment: Normal   Balance Balance Balance Assessed: Yes Standardized Balance Assessment Standardized Balance Assessment: Berg Balance Test Berg Balance Test Sit to Stand: Able to stand without using hands and stabilize independently Standing Unsupported: Able to stand safely 2 minutes Sitting with Back Unsupported but Feet Supported on Floor or Stool: Able to sit safely and securely 2 minutes Stand to Sit: Sits safely with  minimal use of hands Transfers: Able to  transfer safely, minor use of hands Standing Unsupported with Eyes Closed: Able to stand 10 seconds safely Standing Ubsupported with Feet Together: Able to place feet together independently and stand 1 minute safely From Standing, Reach Forward with Outstretched Arm: Can reach confidently >25 cm (10") From Standing Position, Pick up Object from Floor: Able to pick up shoe safely and easily From Standing Position, Turn to Look Behind Over each Shoulder: Looks behind from both sides and weight shifts well Turn 360 Degrees: Able to turn 360 degrees safely in 4 seconds or less Standing Unsupported, Alternately Place Feet on Step/Stool: Able to complete >2 steps/needs minimal assist Standing Unsupported, One Foot in Front: Needs help to step but can hold 15 seconds Standing on One Leg: Tries to lift leg/unable to hold 3 seconds but remains standing independently Total Score: 47   End of Session PT - End of Session Equipment Utilized During Treatment: Gait belt Activity Tolerance: Patient tolerated treatment well Patient left: in bed  GP     [name] 08/06/2012, 9:55 AM

## 2012-08-07 DIAGNOSIS — E785 Hyperlipidemia, unspecified: Secondary | ICD-10-CM | POA: Diagnosis not present

## 2012-08-07 DIAGNOSIS — J449 Chronic obstructive pulmonary disease, unspecified: Secondary | ICD-10-CM | POA: Diagnosis not present

## 2012-08-07 DIAGNOSIS — I10 Essential (primary) hypertension: Secondary | ICD-10-CM | POA: Diagnosis not present

## 2012-08-07 DIAGNOSIS — I6350 Cerebral infarction due to unspecified occlusion or stenosis of unspecified cerebral artery: Secondary | ICD-10-CM | POA: Diagnosis not present

## 2012-08-07 NOTE — Progress Notes (Signed)
[facility] PROGRESS NOTE  Glenn [name] [id number] DOB: 14-Nov-1941 DOA: 08/05/2012 PCP: Glo Herring., MD  Assessment/Plan: Principal Problem:  *CVA (cerebral infarction) Active Problems:  HTN (hypertension)  COPD (chronic obstructive pulmonary disease)  Tobacco abuse  Memory difficulties  Hyperlipidemia  1. Subacute CVA. Patient was found to have a subacute left occipital parietal CVA. He does have a stroke in the past approximately 12 years ago. He reports being on aspirin but has been off aspirin for the past one year. It is unclear as to why this was stopped. He has been restarted on aspirin. Carotid Dopplers do not show any significant stenosis. 2-D echocardiogram is pending and will need to be done prior to discharge. Neurology consult is also pending. 2. Hypertension. Continue outpatient regimen 3. COPD. Stable. Patient was strongly advised to quit smoking 4. Memory difficulties. Possibly due to early vascular dementia  Code Status: Full code Family Communication: Discussed with patient Disposition Plan: Discharge home once workup is complete   Brief narrative: This 71 year old man, who has previous history of some memory issues, possibly early dementia of the vascular type, presents with the above symptoms since 10:00 this morning. At one time his left arm and hand became clumsy and then also in the parking lot, he was not able to walk in a straight line, seemed to be walking around in circles. His speech was not effected and his mentation was somewhat confused according to other witnesses and family members.  Since being in the emergency room, his symptoms have improved significantly. MRI brain scan is consistent with a CVA. He is not entirely clear which side was affected the left of the right.   Consultants:  Neurology consult pending  Procedures:  None  Antibiotics:  None  HPI/Subjective: Patient is feeling better. Feels back to normal. Does not  have any complaints  Objective: Filed Vitals:   08/06/12 1438 08/06/12 2055 08/07/12 0418 08/07/12 0743  BP: 134/85 149/86 139/75   Pulse: 73 75 53 55  Temp: 97.5 F (36.4 C) 97.3 F (36.3 C) 97.3 F (36.3 C)   TempSrc: Oral Oral Oral   Resp: 18 19 18    Height:      Weight:      SpO2: 94% 93% 97% 98%    Intake/Output Summary (Last 24 hours) at 08/07/12 1137 Last data filed at 08/07/12 [id number]  Gross per 24 hour  Intake    340 ml  Output      0 ml  Net    340 ml   Filed Weights   08/05/12 1121 08/05/12 1835  Weight: 72.576 kg (160 lb) 72.4 kg (159 lb 9.8 oz)    Exam:   General:  No acute distress  Cardiovascular: S1, S2, regular rate and rhythm  Respiratory: Clear to auscultation bilaterally  Abdomen: Soft, nontender, nondistended, bowel sounds active  Data Reviewed: Basic Metabolic Panel:  Lab [id number] 08/05/12 1154  NA 142 141  K 3.9 4.3  CL 109 106  CO2 24 27  GLUCOSE 89 90  BUN 19 19  CREATININE 1.33 1.38*  CALCIUM 9.0 9.3  MG -- --  PHOS -- --   Liver Function Tests:  Lab 08/06/12 0455  AST 32  ALT 18  ALKPHOS 65  BILITOT 0.2*  PROT 6.0  ALBUMIN 2.7*   No results found for this basename: LIPASE:5,AMYLASE:5 in the last 168 hours No results found for this basename: AMMONIA:5 in the last 168 hours CBC:  Lab 08/05/12 1154  WBC  6.9  NEUTROABS 4.1  HGB 14.1  HCT 41.1  MCV 89.7  PLT 200   Cardiac Enzymes: No results found for this basename: CKTOTAL:5,CKMB:5,CKMBINDEX:5,TROPONINI:5 in the last 168 hours BNP (last 3 results) No results found for this basename: PROBNP:3 in the last 8760 hours CBG: No results found for this basename: GLUCAP:5 in the last 168 hours  No results found for this or any previous visit (from the past 240 hour(s)).   Studies: Ct Abdomen Wo Contrast  Jul 21, 2012  *RADIOLOGY REPORT*  Clinical Data:  Right upper quadrant abdominal pain.  Hypertension.  CT ABDOMEN WITHOUT CONTRAST  Technique:  Multidetector CT  imaging of the abdomen was performed following the standard protocol without IV contrast.  Comparison:  Multiple exams, including 11/26/2011  Findings:  Airway plugging and atelectasis noted in the right lower lobe. The visualized portion of the liver, spleen, pancreas, and adrenal glands appear unremarkable in noncontrast CT appearance.  The gallbladder is mildly contracted.  Considerable dextroconvex lumbar scoliosis noted with rotary component. Considerable spondylosis and degenerative disc disease noted with suspected impingement at L2-3, L3-4, L4-5, and L5-S1.  4 cm right renal cyst noted with peripheral calcification along the posterior margin on image 26 of series 2.  The cyst measures -2 HU in density.  There is a 3 mm right kidney lower pole nonobstructive calculus. Adjacent to this is a hypodense lesion along the renal collecting system measuring -2 HU, statistically likely to be a cyst.  Aortoiliac atherosclerotic calcification noted. No pathologic retroperitoneal or porta hepatis adenopathy is identified.  Orally administered contrast noted in the visualized small bowel and colon.  The pelvis was not imaged on today's examination.  IMPRESSION:  1.  Nonobstructive right nephrolithiasis. 2.  Right renal cysts as shown on prior ultrasound of 11/26/2011. The larger cyst has posterior rim calcifications and accordingly is probably a Bosniak category II cyst (although IV contrast was not administered). 3.  Lumbar scoliosis and spondylosis, with multilevel impingement. 4.  Airway plugging and atelectasis in the right lower lobe.  Original Report Authenticated By: Carron Curie, M.D.   Dg Chest 2 View  08/05/2012  *RADIOLOGY REPORT*  Clinical Data: Hypertension, hyperlipidemia, left-sided weakness  CHEST - 2 VIEW  Comparison: Chest radiograph 11/27/2011  Findings: Normal cardiac silhouette.  Aorta is ectatic.  No effusion, infiltrate, or pneumothorax.  There is flattening of the hemidiaphragms.  Degenerative osteophytosis of the thoracic spine. There is a compression deformities of the mid thoracic spine which are progressed when compared to prior.  IMPRESSION:  1.   No acute cardiopulmonary findings.  2.  Hyperinflated lungs.  3.   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  Original Report Authenticated By: Suzy Bouchard, M.D.   Ct Head Wo Contrast  08/05/2012  *RADIOLOGY REPORT*  Clinical Data: Left-sided weakness  CT HEAD WITHOUT CONTRAST  Technique:  Contiguous axial images were obtained from the base of the skull through the vertex without contrast.  Comparison: 09/18/2011  Findings: There is a mixed density mass within the left posterior parietal/occipital lobe.  This measures 2.1 x 2.9 cm and is new when compared with the previous examination.  The surrounding low density edema is identified.  There is diffuse patchy low density throughout the subcortical and periventricular white matter consistent with chronic small vessel ischemic change.  There is prominence of the sulci and ventricles consistent with brain atrophy.  There is no evidence for intracranial hemorrhage.  No significant midline shift.  The paranasal sinuses and mastoid  air cells are clear.  The skull is intact.  IMPRESSION:  1.  Indeterminant, mixed density structure within the left posterior cerebral hemisphere. Cannot rule out tumor.  This may be primary or metastatic to the brain.  Recommend further evaluation with contrast enhanced MRI. 2.  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  These results will be called to the ordering clinician or representative by the Radiologist Assistant, and communication documented in the PACS Dashboard.  Original Report Authenticated By: Angelita Ingles, M.D.   Mr Jodene Nam Head Wo Contrast  08/05/2012  *RADIOLOGY REPORT*  Clinical Data: 71 year old male with acute left brain stroke. Weakness, hypertension.  MRA HEAD WITHOUT CONTRAST  Technique: Angiographic images of the Circle  of Willis were obtained using MRA technique without intravenous contrast.  Comparison: Brain MRI 08/05/2012.  Findings: Dominant appearing distal left vertebral artery with antegrade flow supplies the basilar.  Diminutive distal right vertebral artery terminates in the right PICA.  Dominant appearing left AICA.  No basilar artery stenosis.  SCA and PCA origins are within normal limits.  Bilateral P1 P2 segments are within normal limits.  No convincing PCA branch occlusion, distal branches partially affected by MOTSA artifact.  Posterior communicating arteries are diminutive or absent.  Antegrade flow in both ICA siphons.  Venous contamination (from earlier contrast administration) occurs.  No definite hemodynamically significant ICA stenosis.  Carotid termini are patent.  MCA and right ACA origins are within normal limits.  The left A1 segment is nondominant.  Anterior communicating artery and visualized ACA branches are within normal limits.  Visualized right MCA branches are within normal limits.  On the left the M1 segment and MCA bifurcation are patent.  There is decreased flow in the proximal aspect of a dominant posterior left sylvian division (series 17 image 11).  There is preserved more distal flow in the vessel.  This does occur at [facility] interface.  IMPRESSION: 1.  Questionable high-grade stenosis or partial occlusion of the proximal left MCA posterior sylvian M2 branch.  Alternatively this may be due to artifact. 2.  No definite PCA branch occlusion. 3.  No proximal intracranial stenosis. 4.  Study signal to noise affected by earlier contrast administration.  Original Report Authenticated By: Randall An, M.D.   Mr Jeri Cos [id number] Contrast  08/05/2012  *RADIOLOGY REPORT*  Clinical Data: Left-sided weakness.  Abnormal CT. Hypertensive hyperlipidemic patient.  No history of cancer.  MRI HEAD WITHOUT AND WITH CONTRAST  Technique:  Multiplanar, multiecho pulse sequences of the brain and surrounding structures  were obtained according to standard protocol without and with intravenous contrast  Contrast: 46mL MULTIHANCE GADOBENATE DIMEGLUMINE 529 MG/ML IV SOLN  Comparison: 08/05/2012 CT and 09/18/2011 MR.  Findings: Moderate size left occipital - parietal lobe subacute partially hemorrhagic infarct.  This is felt unlikely to represent tumor and can be reassessed on follow-up if the patient had progressive symptoms to confirm this impression of infarction.  When compared to the prior MR, progressive patchy white matter type changes and compliment periventricular white matter changes with progressive of left frontal subcortical white matter type changes most consistent with result of small vessel disease given the patient's history of hypertension and hyperlipidemia.  Remote right thalamic infarct and left corona radiata infarct.  No acute right hemispheric infarct to explain the patient's left- sided symptoms.  Global atrophy without hydrocephalus.  Right vertebral artery is diminutive in size and possibly narrowed/occluded.  Remainder of the major intracranial vascular structures are patent.  Partially empty  sella incidentally noted.  IMPRESSION:  Moderate size left occipital - parietal lobe subacute partially hemorrhagic infarct.  This is felt unlikely to represent tumor and can be reassessed on follow-up if the patient had progressive symptoms to confirm this impression of infarction.  Progressive small vessel disease type changes as noted above.  Remote small right thalamic infarct and left corona radiata infarct.  No acute right hemispheric infarct to explain the patient's left- sided symptoms.  Global atrophy without hydrocephalus.  Right vertebral artery is diminutive in size and possibly narrowed/occluded.  Original Report Authenticated By: Doug Sou, M.D.   US Carotid Duplex Bilateral  08/05/2012  *RADIOLOGY REPORT*  Clinical Data: Subacute hemorrhagic infarct in the left parieto- occipital lobe. Long-time  smoker with history of hypertension.  BILATERAL CAROTID DUPLEX ULTRASOUND  Technique: Pearline Cables scale imaging, color Doppler and duplex ultrasound was performed of bilateral carotid and vertebral arteries in the neck.  Comparison:  None.  Criteria:  Quantification of carotid stenosis is based on velocity parameters that correlate the residual internal carotid diameter with NASCET-based stenosis levels, using the diameter of the distal internal carotid lumen as the denominator for stenosis measurement.  The following velocity measurements were obtained:                   PEAK SYSTOLIC/END DIASTOLIC RIGHT ICA:                        111/16cm/sec CCA:                        0000000 SYSTOLIC ICA/CCA RATIO:     [id number] DIASTOLIC ICA/CCA RATIO:    2.01 ECA:                        120/12cm/sec  LEFT ICA:                        101/15cm/sec CCA:                        [id number] SYSTOLIC ICA/CCA RATIO:     [id number] DIASTOLIC ICA/CCA RATIO:    1.36 ECA:                        102/12cm/sec  Findings:  RIGHT CAROTID ARTERY: Moderate intimal thickening involving the CCA, with scattered calcified and noncalcified plaques along the mid and distal CCA.  Moderate predominately noncalcified plaque in the carotid bulb and proximal ICA and ECA.  No visible stenosis of greater than 50% diameter at gray scale or color imaging.  Mild spectral broadening involving the ICA waveform.  RIGHT VERTEBRAL ARTERY:  Antegrade flow with normal waveform.  LEFT CAROTID ARTERY: Moderate intimal thickening involving the CCA. Moderate to severe predominately noncalcified plaque involving the distal CCA, bulb, and proximal ICA and ECA.  Possible stenosis involving the proximal ECA at gray scale and color imaging.  No visible stenosis involving the proximal ICA at gray scale and color imaging, though a calcified plaque anteriorly shadows a small portion of the proximal ICA.  Mild spectral broadening involving the ICA waveform.  LEFT VERTEBRAL ARTERY:  Antegrade  flow with normal waveform.  IMPRESSION:  1.  No evidence of hemodynamically significant stenosis involving either the right or left CCA or ICA by Doppler criteria.  One caveat is that there is a calcified plaque  in the anterior wall of the proximal left ICA which obscures a small portion of this vessel. 2.  Moderate to severe predominately noncalcified plaque involving the distal left CCA and proximal left ICA. 3.  Mild to moderate predominately noncalcified plaque involving the right carotid bulb and proximal ICA. 4.  Antegrade flow in both vertebral arteries.  Original Report Authenticated By: Deniece Portela, M.D.    Scheduled Meds:    . amLODipine  5 mg Oral q morning - 10a  . aspirin EC  81 mg Oral Daily  . atorvastatin  40 mg Oral QHS  . divalproex  1,000 mg Oral QHS  . divalproex  500 mg Oral Daily  . donepezil  10 mg Oral QHS  . loratadine  10 mg Oral Daily  . DISCONTD: heparin  5,000 Units Subcutaneous Q8H   Continuous Infusions:    . DISCONTD: sodium chloride 50 mL/hr at 08/06/12 1021    Principal Problem:  *CVA (cerebral infarction) Active Problems:  HTN (hypertension)  COPD (chronic obstructive pulmonary disease)  Tobacco abuse  Memory difficulties  Hyperlipidemia    Time spent: 20 minutes    [facility] Pager [phone]. If 7PM-7AM, please contact night-coverage at [email or web address], password [facility] 08/07/2012, 11:37 AM  LOS: 2 days

## 2012-08-08 ENCOUNTER — Telehealth (HOSPITAL_COMMUNITY): Payer: Self-pay | Admitting: *Deleted

## 2012-08-08 ENCOUNTER — Inpatient Hospital Stay (HOSPITAL_COMMUNITY)
Admit: 2012-08-08 | Discharge: 2012-08-08 | Disposition: A | Discharge status: Home / Self Care | Payer: Medicare Other | Attending: Neurology | Admitting: Neurology

## 2012-08-08 ENCOUNTER — Inpatient Hospital Stay (HOSPITAL_COMMUNITY): Payer: Medicare Other

## 2012-08-08 DIAGNOSIS — I6340 Cerebral infarction due to embolism of unspecified cerebral artery: Secondary | ICD-10-CM | POA: Diagnosis not present

## 2012-08-08 DIAGNOSIS — I658 Occlusion and stenosis of other precerebral arteries: Secondary | ICD-10-CM | POA: Diagnosis not present

## 2012-08-08 DIAGNOSIS — I6350 Cerebral infarction due to unspecified occlusion or stenosis of unspecified cerebral artery: Secondary | ICD-10-CM | POA: Diagnosis not present

## 2012-08-08 DIAGNOSIS — I517 Cardiomegaly: Secondary | ICD-10-CM

## 2012-08-08 DIAGNOSIS — J449 Chronic obstructive pulmonary disease, unspecified: Secondary | ICD-10-CM | POA: Diagnosis not present

## 2012-08-08 DIAGNOSIS — F39 Unspecified mood [affective] disorder: Secondary | ICD-10-CM | POA: Diagnosis not present

## 2012-08-08 DIAGNOSIS — I6529 Occlusion and stenosis of unspecified carotid artery: Secondary | ICD-10-CM | POA: Diagnosis not present

## 2012-08-08 DIAGNOSIS — I10 Essential (primary) hypertension: Secondary | ICD-10-CM | POA: Diagnosis not present

## 2012-08-08 DIAGNOSIS — R569 Unspecified convulsions: Secondary | ICD-10-CM | POA: Diagnosis not present

## 2012-08-08 MED ORDER — SODIUM CHLORIDE 0.9 % IV SOLN
INTRAVENOUS | Status: DC
  Administered 2012-08-08 – 2012-08-09 (×2): via INTRAVENOUS

## 2012-08-08 MED ORDER — IOHEXOL 350 MG/ML SOLN
100.0000 mL | Freq: Once | INTRAVENOUS | Status: AC | PRN
  Administered 2012-08-08: 80 mL via INTRAVENOUS

## 2012-08-08 NOTE — Progress Notes (Signed)
Called the EEG department at [facility] and spoke with Verdis Frederickson about when the technician would be over to perform the EEG test ordered today.  I voiced to her the MD was wanting to know so he could write orders accordingly.  She stated that they would be over within appr. 2 hrs.  Voiced this to Dr. Roderic Palau.

## 2012-08-08 NOTE — Progress Notes (Signed)
Occupational Therapy Screen  OT order received. Chart read. Spoke with PT about patient's functional performance level during PT eval. Patient is performing at baseline and does not require OT services at this time. Will sign off.  Ailene Ravel, OTR/L 08/08/12 14:54

## 2012-08-08 NOTE — Consult Note (Signed)
Glenn [name]               ACCOUNT NO.:  [id number]  MEDICAL RECORD NO.:  [id number]  LOCATION:  A327                          FACILITY:  [facility]  PHYSICIAN:  Obera Stauch A. Merlene Laughter, M.D. DATE OF BIRTH:  Apr 27, 1941  DATE OF CONSULTATION: DATE OF DISCHARGE:                                CONSULTATION   This is a 71 year old man who is going to see his psychologist when he suddenly raised his left arm up in a upwards fashion and his hands clenched in a C-like shape.  He then started leaning towards the left and walking around in a circle, this was associated with confusion.  The patient was subsequently seen by the psychologist and sent over to the hospital.  The entire event lasted 30 minutes and he was back to baseline.  We have seen the patient over the last year for cognitive impairment and memory impairment.  He is being worked up extensively here and also at [facility].  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  For reasons unknown, the patient had been on aspirin, but this was discontinued over the last year.  No clear reasons given.  We placed the patient on Depakote because of his behavioral problem, this actually has made a significant difference in his symptoms.  He has seen a psychologist routinely and having repeat neuropsychological evaluations.  PHYSICAL EXAMINATION:  GENERAL:  Shows a pleasant thin man, in no acute distress.  He immediately recognizes me. NECK:  Supple. HEENT:  Head is normocephalic, atraumatic. ABDOMEN:  Soft. EXTREMITIES:  No significant edema. MENTATION:  He is awake and alert.  He is essentially lucid, still he is sort of feisty self, which is his baseline.  Stated he wants to go home today.  He is oriented to person, place, year, and month.  Follows commands well.  Cranial nerve  evaluation shows the pupils are 4 mm and briskly reactive.  Extraocular movements are full.  Facial muscle strength is symmetric.  Tongue is midline.  Uvula midline.  Shoulder shrug normal.  Motor examination shows normal tone, bulk, and strength. Coordination shows no dysmetria.  No tremors.  No parkinsonism. Reflexes are 2+ and symmetric.  Sensation normal to light touch.  MRI scans are reviewed again in person.  Most recent MRI scan shows a wedge-shaped high signal on FLAIR imaging, consistent with the infarct, it was not seen on the previous scan done a year ago.  There is also increased signal seen in the left frontal area both in a somewhat wedge- shaped fashion, both of these signals suggest ischemia. Again the increased On FLAIR imaging in the left frontal (between the ACA and the MCA Distribution) and also the left parieto-occipital area are suggestive of watershed infarcts.  He has increased white matter signal, deep white matter periventricular consistent with chronic ischemia.  The FLAIR imaging shows increased signal in the left parieto-occipital area and the deep periphery; however, the other part of the left parieto-occipital area are not associated with increased signal.  I do not see low signal in  the ADC area suggesting that this is more of a subacute and not an acute injury. On the spin echo, there is a couple of tiny hypointense signal suggestive of blood products broken down.  The CT scan shows the hypodense lesion in the left frontal and left parietal area without evidence of acute hemorrhage, however.  ASSESSMENT:  I believe the patient's semiology seems most consistent with a seizure rather than acute stroke.  His MRI findings does not support a diagnosis of an acute stroke.  I believe the MRI suggests more of a subacute stroke few weeks if not months ago.  The tiny amount of blood that was seen there is of no clinical significance given that is not seen on CT scan,  unlikely is an old finding.  Certainly, he has had progression and recurrent events since his last MRI done a year ago. Again for reasons unknown, the patient was not on aspirin, apparently stopped taking this.  The patient has been given diagnosis of vascular dementia, although I do not believe this is appropriate at this time. He certainly has increased risk of developing vascular dementia, but I am not sure he meets the criteria, although would restrain from giving him this diagnosis.  I would probably has a diagnosis of mild cognitive impairment. Risk factors, hypertension, previous infarcts, and dyslipidemia.  RECOMMENDATIONS:  The patient's carotid Doppler, although does not show hemodynamically significant stenosis does show marked to severe calcification involving the left CCA and left ICA. I believe we should get better characterization of this given that he has had a couple of events on the left hemisphere suggestive of embolic phenomenon.  I therefore recommend CT angio or MRA of the neck.  EEG is also highly recommended.  Continue with aspirin therapy.  Also, I would suggest lipid-lowering agents, particularly statins.  Thanks for this consultation.     Zamire Whitehurst A. Merlene Laughter, M.D.     KAD/MEDQ  D:  08/08/2012  T:  08/08/2012  Job:  [id number]

## 2012-08-08 NOTE — Progress Notes (Signed)
*  PRELIMINARY RESULTS* Echocardiogram 2D Echocardiogram has been performed.  Tera Partridge 08/08/2012, 10:33 AM

## 2012-08-08 NOTE — Progress Notes (Signed)
[facility] PROGRESS NOTE  Glenn [name] [id number] DOB: 01-12-1941 DOA: 08/05/2012 PCP: Glo Herring., MD  Assessment/Plan: Principal Problem:  *CVA (cerebral infarction) Active Problems:  HTN (hypertension)  COPD (chronic obstructive pulmonary disease)  Tobacco abuse  Memory difficulties  Hyperlipidemia  1. Subacute CVA. Patient was found to have a subacute left occipital parietal CVA. He does have a stroke in the past approximately 12 years ago. He reports being on aspirin but has been off aspirin for the past one year. It is unclear as to why this was stopped. He has been restarted on aspirin. Carotid Dopplers do not show any significant stenosis. Patient was seen by neurology.  CT angio of the neck was ordered and showed diffuse atherosclerotic disease, without any evidence of flow limiting lesions.  2D echo was also unremarkable.  EEG was ordered and is currently pending. He is on a statin, aspirin and blood pressure is stable. 2. Hypertension. Continue outpatient regimen 3. COPD. Stable. Patient was strongly advised to quit smoking 4. Memory difficulties. 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 At one time his left arm and hand became clumsy and then also in the parking lot, he was not able to walk in a straight line, seemed to be walking around in circles. His speech was not effected and his mentation was somewhat confused according to other witnesses and family members.  Since being in the emergency room, his symptoms have improved significantly. MRI brain scan is consistent with a CVA. He is not entirely clear which side was affected  the left of the right.   Consultants:  Neurology   Procedures:  None  Antibiotics:  None  HPI/Subjective: No new complaints today.  Objective: Filed Vitals:   08/07/12 2025 08/07/12 2151 08/08/12 0231 08/08/12 0554  BP:  144/72 151/88 127/70  Pulse: 57 69 65 59  Temp:  98 F (36.7 C) 97.6 F (36.4 C) 97.5 F (36.4 C)  TempSrc:  Oral Oral Oral  Resp: 18 18 19 18   Height:      Weight:      SpO2: 95% 94% 97% 95%    Intake/Output Summary (Last 24 hours) at 08/08/12 1354 Last data filed at 08/08/12 1200  Gross per 24 hour  Intake    540 ml  Output      0 ml  Net    540 ml   Filed Weights   08/05/12 1121 08/05/12 1835  Weight: 72.576 kg (160 lb) 72.4 kg (159 lb 9.8 oz)    Exam:   General:  No acute distress  Cardiovascular: S1, S2, regular rate and rhythm  Respiratory: Clear to auscultation bilaterally  Abdomen: Soft, nontender, nondistended, bowel sounds active  Data Reviewed: Basic Metabolic Panel:  Lab [id number] 08/05/12 1154  NA 142 141  K 3.9 4.3  CL 109 106  CO2 24 27  GLUCOSE 89 90  BUN 19 19  CREATININE 1.33 1.38*  CALCIUM 9.0 9.3  MG -- --  PHOS -- --   Liver Function Tests:  Lab 08/06/12 0455  AST 32  ALT 18  ALKPHOS 65  BILITOT 0.2*  PROT 6.0  ALBUMIN 2.7*   No results found for this  basename: LIPASE:5,AMYLASE:5 in the last 168 hours No results found for this basename: AMMONIA:5 in the last 168 hours CBC:  Lab 08/05/12 1154  WBC 6.9  NEUTROABS 4.1  HGB 14.1  HCT 41.1  MCV 89.7  PLT 200   Cardiac Enzymes: No results found for this basename: CKTOTAL:5,CKMB:5,CKMBINDEX:5,TROPONINI:5 in the last 168 hours BNP (last 3 results) No results found for this basename: PROBNP:3 in the last 8760 hours CBG: No results found for this basename: GLUCAP:5 in the last 168 hours  No results found for this or any previous visit (from the past 240 hour(s)).   Studies: Ct Abdomen Wo Contrast  15-Jul-2012  *RADIOLOGY REPORT*   Clinical Data:  Right upper quadrant abdominal pain.  Hypertension.  CT ABDOMEN WITHOUT CONTRAST  Technique:  Multidetector CT imaging of the abdomen was performed following the standard protocol without IV contrast.  Comparison:  Multiple exams, including 11/26/2011  Findings:  Airway plugging and atelectasis noted in the right lower lobe. The visualized portion of the liver, spleen, pancreas, and adrenal glands appear unremarkable in noncontrast CT appearance.  The gallbladder is mildly contracted.  Considerable dextroconvex lumbar scoliosis noted with rotary component. Considerable spondylosis and degenerative disc disease noted with suspected impingement at L2-3, L3-4, L4-5, and L5-S1.  4 cm right renal cyst noted with peripheral calcification along the posterior margin on image 26 of series 2.  The cyst measures -2 HU in density.  There is a 3 mm right kidney lower pole nonobstructive calculus. Adjacent to this is a hypodense lesion along the renal collecting system measuring -2 HU, statistically likely to be a cyst.  Aortoiliac atherosclerotic calcification noted. No pathologic retroperitoneal or porta hepatis adenopathy is identified.  Orally administered contrast noted in the visualized small bowel and colon.  The pelvis was not imaged on today's examination.  IMPRESSION:  1.  Nonobstructive right nephrolithiasis. 2.  Right renal cysts as shown on prior ultrasound of 11/26/2011. The larger cyst has posterior rim calcifications and accordingly is probably a Bosniak category II cyst (although IV contrast was not administered). 3.  Lumbar scoliosis and spondylosis, with multilevel impingement. 4.  Airway plugging and atelectasis in the right lower lobe.  Original Report Authenticated By: Carron Curie, M.D.   Dg Chest 2 View  08/05/2012  *RADIOLOGY REPORT*  Clinical Data: Hypertension, hyperlipidemia, left-sided weakness  CHEST - 2 VIEW  Comparison: Chest radiograph 11/27/2011  Findings: Normal cardiac  silhouette.  Aorta is ectatic.  No effusion, infiltrate, or pneumothorax.  There is flattening of the hemidiaphragms. Degenerative osteophytosis of the thoracic spine. There is a compression deformities of the mid thoracic spine which are progressed when compared to prior.  IMPRESSION:  1.   No acute cardiopulmonary findings.  2.  Hyperinflated lungs.  3.   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  Original Report Authenticated By: Suzy Bouchard, M.D.   Ct Head Wo Contrast  08/05/2012  *RADIOLOGY REPORT*  Clinical Data: Left-sided weakness  CT HEAD WITHOUT CONTRAST  Technique:  Contiguous axial images were obtained from the base of the skull through the vertex without contrast.  Comparison: 09/18/2011  Findings: There is a mixed density mass within the left posterior parietal/occipital lobe.  This measures 2.1 x 2.9 cm and is new when compared with the previous examination.  The surrounding low density edema is identified.  There is diffuse patchy low density throughout the subcortical and periventricular white matter consistent with chronic small vessel ischemic change.  There is prominence of the  sulci and ventricles consistent with brain atrophy.  There is no evidence for intracranial hemorrhage.  No significant midline shift.  The paranasal sinuses and mastoid air cells are clear.  The skull is intact.  IMPRESSION:  1.  Indeterminant, mixed density structure within the left posterior cerebral hemisphere. Cannot rule out tumor.  This may be primary or metastatic to the brain.  Recommend further evaluation with contrast enhanced MRI. 2.  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  These results will be called to the ordering clinician or representative by the Radiologist Assistant, and communication documented in the PACS Dashboard.  Original Report Authenticated By: Angelita Ingles, M.D.   Mr Jodene Nam Head Wo Contrast  08/05/2012  *RADIOLOGY REPORT*  Clinical Data: 71 year old male with  acute left brain stroke. Weakness, hypertension.  MRA HEAD WITHOUT CONTRAST  Technique: Angiographic images of the Circle of Willis were obtained using MRA technique without intravenous contrast.  Comparison: Brain MRI 08/05/2012.  Findings: Dominant appearing distal left vertebral artery with antegrade flow supplies the basilar.  Diminutive distal right vertebral artery terminates in the right PICA.  Dominant appearing left AICA.  No basilar artery stenosis.  SCA and PCA origins are within normal limits.  Bilateral P1 P2 segments are within normal limits.  No convincing PCA branch occlusion, distal branches partially affected by MOTSA artifact.  Posterior communicating arteries are diminutive or absent.  Antegrade flow in both ICA siphons.  Venous contamination (from earlier contrast administration) occurs.  No definite hemodynamically significant ICA stenosis.  Carotid termini are patent.  MCA and right ACA origins are within normal limits.  The left A1 segment is nondominant.  Anterior communicating artery and visualized ACA branches are within normal limits.  Visualized right MCA branches are within normal limits.  On the left the M1 segment and MCA bifurcation are patent.  There is decreased flow in the proximal aspect of a dominant posterior left sylvian division (series 17 image 11).  There is preserved more distal flow in the vessel.  This does occur at [facility] interface.  IMPRESSION: 1.  Questionable high-grade stenosis or partial occlusion of the proximal left MCA posterior sylvian M2 branch.  Alternatively this may be due to artifact. 2.  No definite PCA branch occlusion. 3.  No proximal intracranial stenosis. 4.  Study signal to noise affected by earlier contrast administration.  Original Report Authenticated By: Randall An, M.D.   Mr Jeri Cos [id number] Contrast  08/05/2012  *RADIOLOGY REPORT*  Clinical Data: Left-sided weakness.  Abnormal CT. Hypertensive hyperlipidemic patient.  No history of cancer.  MRI  HEAD WITHOUT AND WITH CONTRAST  Technique:  Multiplanar, multiecho pulse sequences of the brain and surrounding structures were obtained according to standard protocol without and with intravenous contrast  Contrast: 43mL MULTIHANCE GADOBENATE DIMEGLUMINE 529 MG/ML IV SOLN  Comparison: 08/05/2012 CT and 09/18/2011 MR.  Findings: Moderate size left occipital - parietal lobe subacute partially hemorrhagic infarct.  This is felt unlikely to represent tumor and can be reassessed on follow-up if the patient had progressive symptoms to confirm this impression of infarction.  When compared to the prior MR, progressive patchy white matter type changes and compliment periventricular white matter changes with progressive of left frontal subcortical white matter type changes most consistent with result of small vessel disease given the patient's history of hypertension and hyperlipidemia.  Remote right thalamic infarct and left corona radiata infarct.  No acute right hemispheric infarct to explain the patient's left- sided symptoms.  Global atrophy  without hydrocephalus.  Right vertebral artery is diminutive in size and possibly narrowed/occluded.  Remainder of the major intracranial vascular structures are patent.  Partially empty sella incidentally noted.  IMPRESSION:  Moderate size left occipital - parietal lobe subacute partially hemorrhagic infarct.  This is felt unlikely to represent tumor and can be reassessed on follow-up if the patient had progressive symptoms to confirm this impression of infarction.  Progressive small vessel disease type changes as noted above.  Remote small right thalamic infarct and left corona radiata infarct.  No acute right hemispheric infarct to explain the patient's left- sided symptoms.  Global atrophy without hydrocephalus.  Right vertebral artery is diminutive in size and possibly narrowed/occluded.  Original Report Authenticated By: Doug Sou, M.D.   US Carotid Duplex  Bilateral  08/05/2012  *RADIOLOGY REPORT*  Clinical Data: Subacute hemorrhagic infarct in the left parieto- occipital lobe. Long-time smoker with history of hypertension.  BILATERAL CAROTID DUPLEX ULTRASOUND  Technique: Pearline Cables scale imaging, color Doppler and duplex ultrasound was performed of bilateral carotid and vertebral arteries in the neck.  Comparison:  None.  Criteria:  Quantification of carotid stenosis is based on velocity parameters that correlate the residual internal carotid diameter with NASCET-based stenosis levels, using the diameter of the distal internal carotid lumen as the denominator for stenosis measurement.  The following velocity measurements were obtained:                   PEAK SYSTOLIC/END DIASTOLIC RIGHT ICA:                        111/16cm/sec CCA:                        0000000 SYSTOLIC ICA/CCA RATIO:     [id number] DIASTOLIC ICA/CCA RATIO:    2.01 ECA:                        120/12cm/sec  LEFT ICA:                        101/15cm/sec CCA:                        [id number] SYSTOLIC ICA/CCA RATIO:     [id number] DIASTOLIC ICA/CCA RATIO:    1.36 ECA:                        102/12cm/sec  Findings:  RIGHT CAROTID ARTERY: Moderate intimal thickening involving the CCA, with scattered calcified and noncalcified plaques along the mid and distal CCA.  Moderate predominately noncalcified plaque in the carotid bulb and proximal ICA and ECA.  No visible stenosis of greater than 50% diameter at gray scale or color imaging.  Mild spectral broadening involving the ICA waveform.  RIGHT VERTEBRAL ARTERY:  Antegrade flow with normal waveform.  LEFT CAROTID ARTERY: Moderate intimal thickening involving the CCA. Moderate to severe predominately noncalcified plaque involving the distal CCA, bulb, and proximal ICA and ECA.  Possible stenosis involving the proximal ECA at gray scale and color imaging.  No visible stenosis involving the proximal ICA at gray scale and color imaging, though a calcified plaque anteriorly  shadows a small portion of the proximal ICA.  Mild spectral broadening involving the ICA waveform.  LEFT VERTEBRAL ARTERY:  Antegrade flow with normal waveform.  IMPRESSION:  1.  No evidence  of hemodynamically significant stenosis involving either the right or left CCA or ICA by Doppler criteria.  One caveat is that there is a calcified plaque in the anterior wall of the proximal left ICA which obscures a small portion of this vessel. 2.  Moderate to severe predominately noncalcified plaque involving the distal left CCA and proximal left ICA. 3.  Mild to moderate predominately noncalcified plaque involving the right carotid bulb and proximal ICA. 4.  Antegrade flow in both vertebral arteries.  Original Report Authenticated By: Deniece Portela, M.D.    Scheduled Meds:    . amLODipine  5 mg Oral q morning - 10a  . aspirin EC  81 mg Oral Daily  . atorvastatin  40 mg Oral QHS  . divalproex  1,000 mg Oral QHS  . divalproex  500 mg Oral Daily  . donepezil  10 mg Oral QHS  . loratadine  10 mg Oral Daily   Continuous Infusions:    Principal Problem:  *CVA (cerebral infarction) Active Problems:  HTN (hypertension)  COPD (chronic obstructive pulmonary disease)  Tobacco abuse  Memory difficulties  Hyperlipidemia    Time spent: 20 minutes    [facility] Pager [phone]. If 7PM-7AM, please contact night-coverage at [email or web address], password [facility] 08/08/2012, 1:54 PM  LOS: 3 days

## 2012-08-08 NOTE — Consult Note (Signed)
Reason for Consult: Referring Physician:   [name] is an 71 y.o. male.  HPI:   Past Medical History  Diagnosis Date  . HTN (hypertension)   . Memory changes   . Back pain   . GERD (gastroesophageal reflux disease)   . Stroke   . COPD (chronic obstructive pulmonary disease)     Past Surgical History  Procedure Date  . Back surgery     Family History  Problem Relation Age of Onset  . Other Father     Social History:  reports that he has been smoking.  He does not have any smokeless tobacco history on file. He reports that he uses illicit drugs (Oxycodone). He reports that he does not drink alcohol.  Allergies: No Known Allergies  Medications:  Prior to Admission medications   Medication Sig Start Date End Date Taking? Authorizing Provider  albuterol (PROVENTIL HFA;VENTOLIN HFA) 108 (90 BASE) MCG/ACT inhaler Inhale 2 puffs into the lungs every 6 (six) hours as needed. For shortness of breath   Yes Historical Provider, MD  amLODipine (NORVASC) 5 MG tablet Take 5 mg by mouth every morning.    Yes Historical Provider, MD  atorvastatin (LIPITOR) 40 MG tablet Take 40 mg by mouth at bedtime.    Yes Historical Provider, MD  cetirizine (ZYRTEC) 10 MG tablet Take 10 mg by mouth every morning.   Yes Historical Provider, MD  divalproex (DEPAKOTE ER) 500 MG 24 hr tablet Take 500-1,000 mg by mouth 2 (two) times daily. Take 1 tablet in the morning and 2 tablets in the evening 07/05/12  Yes Kathlee Nations, MD  donepezil (ARICEPT) 10 MG tablet Take 10 mg by mouth at bedtime.    Yes Historical Provider, MD  Oxycodone HCl 10 MG TABS Take 10 mg by mouth 4 (four) times daily as needed. For pain   Yes Historical Provider, MD  zolpidem (AMBIEN) 10 MG tablet Take 10 mg by mouth at bedtime as needed. For sleep   Yes Historical Provider, MD    Scheduled Meds:   . amLODipine  5 mg Oral q morning - 10a  . aspirin EC  81 mg Oral Daily  . atorvastatin  40 mg Oral QHS  . divalproex  1,000 mg Oral  QHS  . divalproex  500 mg Oral Daily  . donepezil  10 mg Oral QHS  . loratadine  10 mg Oral Daily   Continuous Infusions:  PRN Meds:.albuterol, ondansetron (ZOFRAN) IV, oxyCODONE, senna-docusate, zolpidem   No results found for this or any previous visit (from the past 48 hour(s)).  No results found.  Review of Systems  Constitutional: Negative.   Eyes: Negative.   Respiratory: Negative.   Cardiovascular: Negative.   Gastrointestinal: Negative.   Genitourinary: Negative.   Skin: Negative.   Endo/Heme/Allergies: Negative.   Psychiatric/Behavioral: Positive for depression and memory loss.   Blood pressure 127/70, pulse 59, temperature 97.5 F (36.4 C), temperature source Oral, resp. rate 18, height 5\' 10"  (1.778 m), weight 72.4 kg (159 lb 9.8 oz), SpO2 95.00%. Physical Exam  Assessment/Plan: See dict  Glenn [name] 08/08/2012, 9:17 AM

## 2012-08-08 NOTE — Progress Notes (Signed)
Portable EEG done at [facility].

## 2012-08-08 NOTE — Care Management Note (Unsigned)
    Page 1 of 1   08/08/2012     12:19:50 PM   CARE MANAGEMENT NOTE 08/08/2012  Patient:  Glenn [name], Glenn [name]   Account Number:  [id number]  Date Initiated:  08/08/2012  Documentation initiated by:  Claretha Cooper  Subjective/Objective Assessment:   Pt admitted from home with spouse. Independent with ADL.     Action/Plan:   Denies HH needs. Wife will be taking care of hime.   Anticipated DC Date:  08/09/2012   Anticipated DC Plan:  [place]  CM consult      Choice offered to / List presented to:             Status of service:  In process, will continue to follow Medicare Important Message given?   (If response is "NO", the following Medicare IM given date fields will be blank) Date Medicare IM given:   Date Additional Medicare IM given:    Discharge Disposition:    Per UR Regulation:    If discussed at Long Length of Stay Meetings, dates discussed:    Comments:  08/08/12 [place]

## 2012-08-09 DIAGNOSIS — I10 Essential (primary) hypertension: Secondary | ICD-10-CM | POA: Diagnosis not present

## 2012-08-09 DIAGNOSIS — I6350 Cerebral infarction due to unspecified occlusion or stenosis of unspecified cerebral artery: Secondary | ICD-10-CM | POA: Diagnosis not present

## 2012-08-09 DIAGNOSIS — E785 Hyperlipidemia, unspecified: Secondary | ICD-10-CM | POA: Diagnosis not present

## 2012-08-09 DIAGNOSIS — F39 Unspecified mood [affective] disorder: Secondary | ICD-10-CM | POA: Diagnosis not present

## 2012-08-09 LAB — BASIC METABOLIC PANEL
BUN: 17 mg/dL (ref 6–23)
CO2: 25 mEq/L (ref 19–32)
Chloride: 105 mEq/L (ref 96–112)
Creatinine, Ser: 1.26 mg/dL (ref 0.50–1.35)

## 2012-08-09 MED ORDER — NICOTINE POLACRILEX 2 MG MT GUM: 2.0000 mg | CHEWING_GUM | OROMUCOSAL | Status: AC | PRN

## 2012-08-09 MED ORDER — ASPIRIN 81 MG PO TBEC: 81.0000 mg | DELAYED_RELEASE_TABLET | Freq: Every day | ORAL | Status: AC

## 2012-08-09 NOTE — Progress Notes (Signed)
Pt discharged to home accompanied by wife. Rx and discharge given and explained. Pt and family stated understanding.  Pt left unit via wheelchair in stable condition.

## 2012-08-09 NOTE — Progress Notes (Signed)
Glenn [name], Glenn [name]               ACCOUNT NO.:  [id number]  MEDICAL RECORD NO.:  [id number]  LOCATION:  A327                          FACILITY:  [facility]  PHYSICIAN:  Glenn [name], M.D. DATE OF BIRTH:  06/26/1941  DATE OF PROCEDURE: DATE OF DISCHARGE:                                PROGRESS NOTE   The patient has no new complaints.  He reports that he is feeding well. He is awake and alert.  He is lucid and coherent.  Speech, language, and cognition are unremarkable.  Extraocular movements are full.  Pupils are reactive.  Face and muscle strength are symmetric.  He has good normal strength throughout.  EEG is reviewed and is normal.  The patient's CT angio of the neck is reviewed and shows significant atherosclerotic disease of the aortic arch.  There is also disease of the left ICA proximally, but only graded about 20%.  A BMET was ordered because the patient's creatinine was little high and he got contrast, I think that is still pending.  ASSESSMENT AND PLAN:  Acute neurological event, unclear semiology but most consistent with a seizure from a subacute infarct.  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  It is unclear what to do with this type of disease.  I think aspirin therapy probably should be adequate suspicion that the patient was not on this previously.  The patient is on Depakote for behavioral problems.  I think this should help with seizure prophylaxis for now.  No additional changes are recommended.  I will recommend continue with the current care.  I checked the patient BMET which was ordered earlier today.  This is fine and the patient could be served for discharge later today and follow up in the office in a couple weeks.     Glenn [name], M.D.     KAD/MEDQ  D:  08/09/2012  T:  08/09/2012  Job:  [id number]

## 2012-08-09 NOTE — Discharge Summary (Signed)
Physician Discharge Summary  Glenn [name] [id number] DOB: [birth year] DOA: 08/05/2012  PCP: Glo Herring., MD  Admit date: 08/05/2012 Discharge date: 08/09/2012  Recommendations for Outpatient Follow-up:  1. Follow up with Neurology in 2-3 weeks 2. 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 At one time his left arm and hand became clumsy and then also in the parking lot, he was not able to walk in a straight line, seemed to be walking around in circles. His speech was not effected and his mentation was somewhat confused according to other witnesses and family members.  Since being in the emergency room, his symptoms have improved significantly. MRI brain scan is consistent with a CVA. He is not entirely clear which side was affected the left of the right.   Hospital Course:  This gentleman was admitted to the hospital for transient change in mental status. Initial CT of the head showed indeterminant, mixed density structure within the left posterior cerebral hemisphere. Further characterization with MRI showed left occipital-parietal lobe subacute partially hemorrhagic infarct. Patient was seen by neurology and underwent further workup. MRA of the brain showed questionable high-grade stenosis or partial occlusion of the proximal left MCA, but this felt to possibly be artifact. CT  image of the neck showed diffuse atherosclerotic disease in the carotid arteries as well as the aortic arch. Risk factor modification was recommended. Patient's lipid profile was checked and was found to be acceptable. His blood pressure has remained under reasonable control. He was strongly encouraged to quit smoking. It was felt that his change in mental status may be related to seizure and therefore EEG was done.. This was found to be a normal study. 2-D echo was also found to be normal. Patient was not on any antiplatelet agents prior to this event. Daily aspirin was recommended for secondary prevention. Patient has not had any further symptoms since he's been in the hospital. He'll follow up with neurology in 2-3 weeks.   Procedures:  EEG  Consultations:  Neurology, Dr. Merlene Laughter  Discharge Exam: Filed Vitals:   08/09/12 1120  BP: 133/76  Pulse: 61  Temp: 97.4 F (36.3 C)  Resp: 20   Filed Vitals:   08/08/12 2145 08/09/12 0213 08/09/12 0527 08/09/12 1120  BP: 148/70 135/67 119/72 133/76  Pulse: 53 61 60 61  Temp: 97.8 F (36.6 C) 98.6 F (37 C) 97.3 F (36.3 C) 97.4 F (36.3 C)  TempSrc: Oral Oral Oral Oral  Resp: 20 20 20 20   Height:      Weight:      SpO2: 95% 95% 94% 96%    General: No acute distress Cardiovascular: S1, S2, regular rate and rhythm Respiratory: Clear to auscultation bilaterally  Discharge Instructions  Discharge Orders    Future Orders Please Complete By Expires   Diet - low sodium heart healthy      Increase activity slowly      Call MD  for:  persistant dizziness or light-headedness      Call MD for:  extreme fatigue      Call MD for:  severe uncontrolled pain        Medication List  As of 08/09/2012  1:45 PM   TAKE these medications         albuterol 108 (90 BASE) MCG/ACT inhaler   Commonly known as: PROVENTIL HFA;VENTOLIN HFA   Inhale 2 puffs into the lungs every 6 (six) hours as needed. For shortness of breath      amLODipine 5 MG  tablet   Commonly known as: NORVASC   Take 5 mg by mouth every morning.      aspirin 81 MG EC tablet   Take 1 tablet (81 mg total) by mouth daily.      atorvastatin 40 MG tablet   Commonly known as: LIPITOR   Take 40 mg by mouth at bedtime.      cetirizine 10 MG tablet   Commonly known as: ZYRTEC   Take 10 mg by mouth every morning.      divalproex 500 MG 24 hr tablet   Commonly known as: DEPAKOTE ER   Take 500-1,000 mg by mouth 2 (two) times daily. Take 1 tablet in the morning and 2 tablets in the evening      donepezil 10 MG tablet   Commonly known as: ARICEPT   Take 10 mg by mouth at bedtime.      Oxycodone HCl 10 MG Tabs   Take 10 mg by mouth 4 (four) times daily as needed. For pain      zolpidem 10 MG tablet   Commonly known as: AMBIEN   Take 10 mg by mouth at bedtime as needed. For sleep           Follow-up Information    Follow up with [facility], KOFI, MD. (follow up in 2-3 weeks, call office for appointment)    Contact information:   [place] [phone]       Follow up with Glo Herring., MD. Schedule an appointment as soon as possible for a visit in 2 weeks.   Contact information:   [id number] [place] [address] [phone]           The results of significant diagnostics from this hospitalization (including imaging, microbiology, ancillary and laboratory) are listed below for reference.    Significant Diagnostic Studies: Ct Abdomen Wo Contrast  07-20-12  *RADIOLOGY REPORT*  Clinical Data:  Right upper quadrant abdominal pain.  Hypertension.  CT ABDOMEN WITHOUT CONTRAST  Technique:  Multidetector CT imaging of the abdomen was performed following the standard protocol without IV contrast.  Comparison:  Multiple exams, including 11/26/2011  Findings:  Airway plugging and atelectasis noted in the right lower lobe. The visualized portion of the liver, spleen,  pancreas, and adrenal glands appear unremarkable in noncontrast CT appearance.  The gallbladder is mildly contracted.  Considerable dextroconvex lumbar scoliosis noted with rotary component. Considerable spondylosis and degenerative disc disease noted with suspected impingement at L2-3, L3-4, L4-5, and L5-S1.  4 cm right renal cyst noted with peripheral calcification along the posterior margin on image 26 of series 2.  The cyst measures -2 HU in density.  There is a 3 mm right kidney lower pole nonobstructive calculus. Adjacent to this is a hypodense lesion along the renal collecting system measuring -2 HU, statistically likely to be a cyst.  Aortoiliac  atherosclerotic calcification noted. No pathologic retroperitoneal or porta hepatis adenopathy is identified.  Orally administered contrast noted in the visualized small bowel and colon.  The pelvis was not imaged on today's examination.  IMPRESSION:  1.  Nonobstructive right nephrolithiasis. 2.  Right renal cysts as shown on prior ultrasound of 11/26/2011. The larger cyst has posterior rim calcifications and accordingly is probably a Bosniak category II cyst (although IV contrast was not administered). 3.  Lumbar scoliosis and spondylosis, with multilevel impingement. 4.  Airway plugging and atelectasis in the right lower lobe.  Original Report Authenticated By: Carron Curie, M.D.   Dg Chest 2 View  08/05/2012  *RADIOLOGY REPORT*  Clinical Data: Hypertension, hyperlipidemia, left-sided weakness  CHEST - 2 VIEW  Comparison: Chest radiograph 11/27/2011  Findings: Normal cardiac silhouette.  Aorta is ectatic.  No effusion, infiltrate, or pneumothorax.  There is flattening of the hemidiaphragms. Degenerative osteophytosis of the thoracic spine. There is a compression deformities of the mid thoracic spine which are progressed when compared to prior.  IMPRESSION:  1.   No acute cardiopulmonary findings.  2.  Hyperinflated lungs.  3.   Chronic compression  deformities of the thoracic spine.  Original Report Authenticated By: Suzy Bouchard, M.D.   Ct Head Wo Contrast  08/05/2012  *RADIOLOGY REPORT*  Clinical Data: Left-sided weakness  CT HEAD WITHOUT CONTRAST  Technique:  Contiguous axial images were obtained from the base of the skull through the vertex without contrast.  Comparison: 09/18/2011  Findings: There is a mixed density mass within the left posterior parietal/occipital lobe.  This measures 2.1 x 2.9 cm and is new when compared with the previous examination.  The surrounding low density edema is identified.  There is diffuse patchy low density throughout the subcortical and periventricular white matter consistent with chronic small vessel ischemic change.  There is prominence of the sulci and ventricles consistent with brain atrophy.  There is no evidence for intracranial hemorrhage.  No significant midline shift.  The paranasal sinuses and mastoid air cells are clear.  The skull is intact.  IMPRESSION:  1.  Indeterminant, mixed density structure within the left posterior cerebral hemisphere. Cannot rule out tumor.  This may be primary or metastatic to the brain.  Recommend further evaluation with contrast enhanced MRI. 2.  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  These results will be called to the ordering clinician or representative by the Radiologist Assistant, and communication documented in the PACS Dashboard.  Original Report Authenticated By: Angelita Ingles, M.D.   Ct Angio Neck W/cm &/or Wo/cm  08/08/2012  *RADIOLOGY REPORT*  Clinical Data:  Vascular dimension.  Stroke.  Hypertension.  CT ANGIOGRAPHY NECK  Technique:  Multidetector CT imaging of the neck was performed using the standard protocol during bolus administration of intravenous contrast.  Multiplanar CT image reconstructions including MIPs were obtained to evaluate the vascular anatomy. Carotid stenosis measurements (when applicable) are obtained utilizing NASCET criteria,  using the distal internal carotid diameter as the denominator.  Contrast: 103mL OMNIPAQUE IOHEXOL 350 MG/ML SOLN  Comparison:  MRI 08/05/2012.  Doppler exam 08/05/2012.  Findings:  There is pronounced atherosclerotic disease of the aortic arch.  Branching pattern of the brachiocephalic vessels from the arch is normal.  No origin stenoses.  The right common carotid artery is widely patent to the bifurcation.  There is no stenosis at the carotid bifurcation, but there is moderate atherosclerotic irregularity along the medial wall.  The cervical internal carotid artery is markedly tortuous  in its midportion, taking a nearly complete loop.  There is mild irregularity of the vessel in that loop that could indicate fibromuscular disease.  No stenosis.  The left common carotid artery shows some atherosclerotic plaque along its course.  In the midportion, along the medial wall, there is either a soft plaque or an old dissection.  Minimal diameter of the vessel on this region is 5 mm.  At the bifurcation, there is calcific plaque affecting the bulb, typically along the lateral margin.  Minimal diameter in this region is 3.5 mm.  Compared to a more distal cervical ICA diameter of 4 mm, this indicates a 10-20% diameter stenosis.  The vessel is markedly tortuous in the midportion but does not show irregularity or dilatation in that segment.  Both vertebral artery origins are widely patent.  The left vertebral artery is dominant.  Both vertebral arteries are widely patent through the cervical region.  There is atherosclerotic irregularity of the left subclavian artery just beyond its origin and throughout the proximal 7 cm.  There is not any flow limiting stenosis evident.  There is lesser atherosclerotic irregularity of the right subclavian artery.   Review of the MIP images confirms the above findings.  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  Atherosclerotic  disease of both carotid bifurcation regions.  On the right, there is no stenosis, but there is some atherosclerotic irregularity along the medial wall.  On the left, there is calcific plaque along the lateral wall of the proximal ICA.  Diameter stenosis is only 10-20%.  Tortuosity of both cervical internal carotid arteries.  On the right, there is mild irregularity in the loop, raising the possibility of fibromuscular disease.  There is no stenosis or frank aneurysm.  Atherosclerotic disease of the left common carotid artery in its midportion.  Minimal diameter is 5 mm.  The differential diagnosis is old healed dissection.  Pronounced atherosclerotic irregularity of the proximal left subclavian artery affecting the initial 7 cm of the vessel.  No flow limiting stenosis however.  Original Report Authenticated By: Jules Schick, M.D.   Mr Jodene Nam Head Wo Contrast  08/05/2012  *RADIOLOGY REPORT*  Clinical Data: 71 year old male with acute left brain stroke. Weakness, hypertension.  MRA HEAD WITHOUT CONTRAST  Technique: Angiographic images of the Circle of Willis were obtained using MRA technique without intravenous contrast.  Comparison: Brain MRI 08/05/2012.  Findings: Dominant appearing distal left vertebral artery with antegrade flow supplies the basilar.  Diminutive distal right vertebral artery terminates in the right PICA.  Dominant appearing left AICA.  No basilar artery stenosis.  SCA and PCA origins are within normal limits.  Bilateral P1 P2 segments are within normal limits.  No convincing PCA branch occlusion, distal branches partially affected by MOTSA artifact.  Posterior communicating arteries are diminutive or absent.  Antegrade flow in both ICA siphons.  Venous contamination (from earlier contrast administration) occurs.  No definite hemodynamically significant ICA stenosis.  Carotid termini are patent.  MCA and right ACA origins are within normal limits.  The left A1 segment is nondominant.  Anterior  communicating artery and visualized ACA branches are within normal limits.  Visualized right MCA branches are within normal limits.  On the left the M1 segment and MCA bifurcation are patent.  There is decreased flow in the proximal aspect of a dominant posterior left sylvian division (series 17 image 11).  There is preserved more distal flow in the vessel.  This does  occur at [place].  IMPRESSION: 1.  Questionable high-grade stenosis or partial occlusion of the proximal left MCA posterior sylvian M2 branch.  Alternatively this may be due to artifact. 2.  No definite PCA branch occlusion. 3.  No proximal intracranial stenosis. 4.  Study signal to noise affected by earlier contrast administration.  Original Report Authenticated By: Randall An, M.D.   Mr Jeri Cos [id number] Contrast  08/05/2012  *RADIOLOGY REPORT*  Clinical Data: Left-sided weakness.  Abnormal CT. Hypertensive hyperlipidemic patient.  No history of cancer.  MRI HEAD WITHOUT AND WITH CONTRAST  Technique:  Multiplanar, multiecho pulse sequences of the brain and surrounding structures were obtained according to standard protocol without and with intravenous contrast  Contrast: 66mL MULTIHANCE GADOBENATE DIMEGLUMINE 529 MG/ML IV SOLN  Comparison: 08/05/2012 CT and 09/18/2011 MR.  Findings: Moderate size left occipital - parietal lobe subacute partially hemorrhagic infarct.  This is felt unlikely to represent tumor and can be reassessed on follow-up if the patient had progressive symptoms to confirm this impression of infarction.  When compared to the prior MR, progressive patchy white matter type changes and compliment periventricular white matter changes with progressive of left frontal subcortical white matter type changes most consistent with result of small vessel disease given the patient's history of hypertension and hyperlipidemia.  Remote right thalamic infarct and left corona radiata infarct.  No acute right hemispheric infarct to explain  the patient's left- sided symptoms.  Global atrophy without hydrocephalus.  Right vertebral artery is diminutive in size and possibly narrowed/occluded.  Remainder of the major intracranial vascular structures are patent.  Partially empty sella incidentally noted.  IMPRESSION:  Moderate size left occipital - parietal lobe subacute partially hemorrhagic infarct.  This is felt unlikely to represent tumor and can be reassessed on follow-up if the patient had progressive symptoms to confirm this impression of infarction.  Progressive small vessel disease type changes as noted above.  Remote small right thalamic infarct and left corona radiata infarct.  No acute right hemispheric infarct to explain the patient's left- sided symptoms.  Global atrophy without hydrocephalus.  Right vertebral artery is diminutive in size and possibly narrowed/occluded.  Original Report Authenticated By: Doug Sou, M.D.   US Carotid Duplex Bilateral  08/05/2012  *RADIOLOGY REPORT*  Clinical Data: Subacute hemorrhagic infarct in the left parieto- occipital lobe. Long-time smoker with history of hypertension.  BILATERAL CAROTID DUPLEX ULTRASOUND  Technique: Pearline Cables scale imaging, color Doppler and duplex ultrasound was performed of bilateral carotid and vertebral arteries in the neck.  Comparison:  None.  Criteria:  Quantification of carotid stenosis is based on velocity parameters that correlate the residual internal carotid diameter with NASCET-based stenosis levels, using the diameter of the distal internal carotid lumen as the denominator for stenosis measurement.  The following velocity measurements were obtained:                   PEAK SYSTOLIC/END DIASTOLIC RIGHT ICA:                        111/16cm/sec CCA:                        0000000 SYSTOLIC ICA/CCA RATIO:     [id number] DIASTOLIC ICA/CCA RATIO:    2.01 ECA:                        120/12cm/sec  LEFT ICA:  101/15cm/sec CCA:                        [id number]  SYSTOLIC ICA/CCA RATIO:     [id number] DIASTOLIC ICA/CCA RATIO:    1.36 ECA:                        102/12cm/sec  Findings:  RIGHT CAROTID ARTERY: Moderate intimal thickening involving the CCA, with scattered calcified and noncalcified plaques along the mid and distal CCA.  Moderate predominately noncalcified plaque in the carotid bulb and proximal ICA and ECA.  No visible stenosis of greater than 50% diameter at gray scale or color imaging.  Mild spectral broadening involving the ICA waveform.  RIGHT VERTEBRAL ARTERY:  Antegrade flow with normal waveform.  LEFT CAROTID ARTERY: Moderate intimal thickening involving the CCA. Moderate to severe predominately noncalcified plaque involving the distal CCA, bulb, and proximal ICA and ECA.  Possible stenosis involving the proximal ECA at gray scale and color imaging.  No visible stenosis involving the proximal ICA at gray scale and color imaging, though a calcified plaque anteriorly shadows a small portion of the proximal ICA.  Mild spectral broadening involving the ICA waveform.  LEFT VERTEBRAL ARTERY:  Antegrade flow with normal waveform.  IMPRESSION:  1.  No evidence of hemodynamically significant stenosis involving either the right or left CCA or ICA by Doppler criteria.  One caveat is that there is a calcified plaque in the anterior wall of the proximal left ICA which obscures a small portion of this vessel. 2.  Moderate to severe predominately noncalcified plaque involving the distal left CCA and proximal left ICA. 3.  Mild to moderate predominately noncalcified plaque involving the right carotid bulb and proximal ICA. 4.  Antegrade flow in both vertebral arteries.  Original Report Authenticated By: Deniece Portela, M.D.    Microbiology: No results found for this or any previous visit (from the past 240 hour(s)).   Labs: Basic Metabolic Panel:  Lab [id number] 08/06/12 0455 08/05/12 1154  NA 138 142 141  K 4.1 3.9 4.3  CL 105 109 106  CO2 25 24 27     GLUCOSE 132* 89 90  BUN 17 19 19   CREATININE 1.26 1.33 1.38*  CALCIUM 8.9 9.0 9.3  MG -- -- --  PHOS -- -- --   Liver Function Tests:  Lab 08/06/12 0455  AST 32  ALT 18  ALKPHOS 65  BILITOT 0.2*  PROT 6.0  ALBUMIN 2.7*   No results found for this basename: LIPASE:5,AMYLASE:5 in the last 168 hours No results found for this basename: AMMONIA:5 in the last 168 hours CBC:  Lab 08/05/12 1154  WBC 6.9  NEUTROABS 4.1  HGB 14.1  HCT 41.1  MCV 89.7  PLT 200   Cardiac Enzymes: No results found for this basename: CKTOTAL:5,CKMB:5,CKMBINDEX:5,TROPONINI:5 in the last 168 hours BNP: BNP (last 3 results) No results found for this basename: PROBNP:3 in the last 8760 hours CBG: No results found for this basename: GLUCAP:5 in the last 168 hours  Time coordinating discharge: Greater than 30 minutes  Signed:  MEMON,JEHANZEB  [facility] 08/09/2012, 1:45 PM

## 2012-08-09 NOTE — Procedures (Signed)
Glenn [name], Glenn [name]               ACCOUNT NO.:  [id number]  MEDICAL RECORD NO.:  [id number]  LOCATION:  A327                          FACILITY:  [facility]  PHYSICIAN:  Secilia Apps A. Merlene Laughter, M.D. DATE OF BIRTH:  December 28, 1941  DATE OF PROCEDURE: DATE OF DISCHARGE:                             EEG INTERPRETATION   INDICATIONS:  A 70 year old, who presents with spell of focal tonic activity, left upper extremity suspicious for seizures.  The spells also associated with altered mental status.  MEDICATIONS:  Norvasc, Depakote, aspirin, Lipitor, Aricept, Claritin, Zofran, oxycodone, Senokot, and Ambien.  ANALYSIS:  A 16-channel recording using standard 10/20 measurements is conducted for 22 minutes.  There is a posterior dominant rhythm of 9.5 to 10 Hz, which attenuates with eye opening.  There is beta activity observed in the frontal areas.  Awake and drowsy activities are recorded.  Both photic stimulation and hyperventilation are carried out without abnormal changes in the background activity.  There is no focal or lateralized slowing.  There is no epileptiform activity.  IMPRESSION:  Normal recording of awake and drowsy states.     Commodore Bellew A. Merlene Laughter, M.D.     KAD/MEDQ  D:  08/09/2012  T:  08/09/2012  Job:  [id number]

## 2012-08-09 NOTE — Progress Notes (Signed)
Subjective: Interval History:   Objective: Vital signs in last 24 hours: Temp:  [97.3 F (36.3 C)-98.6 F (37 C)] 97.3 F (36.3 C) (08/13 0527) Pulse Rate:  [53-70] 60  (08/13 0527) Resp:  [18-20] 20  (08/13 0527) BP: (119-148)/(67-86) 119/72 mmHg (08/13 0527) SpO2:  [94 %-96 %] 94 % (08/13 0527)  Intake/Output from previous day: 08/12 0701 - 08/13 0700 In: 720 [P.O.:720] Out: -  Intake/Output this shift:   Nutritional status: Cardiac    Lab Results: No results found for this basename: WBC:2,HGB:2,HCT:2,PLT:2,NA:2,K:2,CL:2,CO2:2,GLUCOSE:2,BUN:2,CREATININE:2,CALCIUM:2,LABA1C in the last 72 hours Lipid Panel No results found for this basename: CHOL,TRIG,HDL,CHOLHDL,VLDL,LDLCALC in the last 72 hours  Studies/Results: Ct Angio Neck W/cm &/or Wo/cm  08/08/2012  *RADIOLOGY REPORT*  Clinical Data:  Vascular dimension.  Stroke.  Hypertension.  CT ANGIOGRAPHY NECK  Technique:  Multidetector CT imaging of the neck was performed using the standard protocol during bolus administration of intravenous contrast.  Multiplanar CT image reconstructions including MIPs were obtained to evaluate the vascular anatomy. Carotid stenosis measurements (when applicable) are obtained utilizing NASCET criteria, using the distal internal carotid diameter as the denominator.  Contrast: 10mL OMNIPAQUE IOHEXOL 350 MG/ML SOLN  Comparison:  MRI 08/05/2012.  Doppler exam 08/05/2012.  Findings:  There is pronounced atherosclerotic disease of the aortic arch.  Branching pattern of the brachiocephalic vessels from the arch is normal.  No origin stenoses.  The right common carotid artery is widely patent to the bifurcation.  There is no stenosis at the carotid bifurcation, but there is moderate atherosclerotic irregularity along the medial wall.  The cervical internal carotid artery is markedly tortuous in its midportion, taking a nearly complete loop.  There is mild irregularity of the vessel in that loop that could  indicate fibromuscular disease.  No stenosis.  The left common carotid artery shows some atherosclerotic plaque along its course.  In the midportion, along the medial wall, there is either a soft plaque or an old dissection.  Minimal diameter of the vessel on this region is 5 mm.  At the bifurcation, there is calcific plaque affecting the bulb, typically along the lateral margin.  Minimal diameter in this region is 3.5 mm.  Compared to a more distal cervical ICA diameter of 4 mm, this indicates a 10-20% diameter stenosis.  The vessel is markedly tortuous in the midportion but does not show irregularity or dilatation in that segment.  Both vertebral artery origins are widely patent.  The left vertebral artery is dominant.  Both vertebral arteries are widely patent through the cervical region.  There is atherosclerotic irregularity of the left subclavian artery just beyond its origin and throughout the proximal 7 cm.  There is not any flow limiting stenosis evident.  There is lesser atherosclerotic irregularity of the right subclavian artery.   Review of the MIP images confirms the above findings.  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  Atherosclerotic disease of both carotid bifurcation regions.  On the right, there is no stenosis, but there is some atherosclerotic irregularity along the medial wall.  On the left, there is calcific plaque along the lateral wall of the proximal ICA.  Diameter stenosis is only 10-20%.  Tortuosity of both cervical internal carotid arteries.  On the right, there is mild irregularity in the loop, raising the possibility of fibromuscular disease.  There is no stenosis or frank aneurysm.  Atherosclerotic disease of the left common carotid artery in its midportion.  Minimal diameter is 5  mm.  The differential diagnosis is old healed dissection.  Pronounced atherosclerotic irregularity of the proximal left subclavian artery  affecting the initial 7 cm of the vessel.  No flow limiting stenosis however.  Original Report Authenticated By: Jules Schick, M.D.    Medications: Scheduled Meds:   . amLODipine  5 mg Oral q morning - 10a  . aspirin EC  81 mg Oral Daily  . atorvastatin  40 mg Oral QHS  . divalproex  1,000 mg Oral QHS  . divalproex  500 mg Oral Daily  . donepezil  10 mg Oral QHS  . loratadine  10 mg Oral Daily   Continuous Infusions:   . sodium chloride 50 mL/hr at 08/09/12 0555   PRN Meds:.albuterol, iohexol, ondansetron (ZOFRAN) IV, oxyCODONE, senna-docusate, zolpidem   Assessment/Plan: See dict    LOS: 4 days   Armanie Martine

## 2012-08-23 DIAGNOSIS — G8929 Other chronic pain: Secondary | ICD-10-CM | POA: Diagnosis not present

## 2012-08-23 DIAGNOSIS — G4700 Insomnia, unspecified: Secondary | ICD-10-CM | POA: Diagnosis not present

## 2012-08-23 DIAGNOSIS — IMO0002 Reserved for concepts with insufficient information to code with codable children: Secondary | ICD-10-CM | POA: Diagnosis not present

## 2012-08-23 DIAGNOSIS — Z719 Counseling, unspecified: Secondary | ICD-10-CM | POA: Diagnosis not present

## 2012-08-23 DIAGNOSIS — I6789 Other cerebrovascular disease: Secondary | ICD-10-CM | POA: Diagnosis not present

## 2012-08-25 DIAGNOSIS — R413 Other amnesia: Secondary | ICD-10-CM | POA: Diagnosis not present

## 2012-08-25 DIAGNOSIS — I10 Essential (primary) hypertension: Secondary | ICD-10-CM | POA: Diagnosis not present

## 2012-08-25 DIAGNOSIS — I6990 Unspecified sequelae of unspecified cerebrovascular disease: Secondary | ICD-10-CM | POA: Diagnosis not present

## 2012-08-25 DIAGNOSIS — R569 Unspecified convulsions: Secondary | ICD-10-CM | POA: Diagnosis not present

## 2012-09-06 ENCOUNTER — Encounter (HOSPITAL_COMMUNITY): Payer: Self-pay | Admitting: Psychiatry

## 2012-09-06 ENCOUNTER — Ambulatory Visit (INDEPENDENT_AMBULATORY_CARE_PROVIDER_SITE_OTHER): Payer: Medicare Other | Admitting: Psychiatry

## 2012-09-06 VITALS — Wt 166.0 lb

## 2012-09-06 DIAGNOSIS — F0630 Mood disorder due to known physiological condition, unspecified: Secondary | ICD-10-CM

## 2012-09-06 DIAGNOSIS — F39 Unspecified mood [affective] disorder: Secondary | ICD-10-CM

## 2012-09-06 NOTE — Progress Notes (Signed)
Chief complaint I stop taking Depakote and Aricept.  I was admitted at hospital for TIA .  I'm feeling better and had does not have any anger issues.  I do need Depakote and Aricept.     History of presenting illness Patient is 71 year old married retired male who came for his followup appointment with his wife.  Patient is stopped taking Depakote and Aricept , he told Dr.Dinqua recommend to stop these medication.  Patient was admitted in the hospital for change mental status.  He was started on aspirin .  Patient has recently seen his neurologist.  Patient denies any recent anger medication or severe mood swing.  He is sleeping better.  He seeing therapist.  He feels he does not need any medication since he learned how to cope in his daily life.  Patient denies any recent issues with his wife.  His wife also endorse that patient has been doing much better and he does not argue  With her.  Since he stopped taking Depakote and Aricept he's feeling more alert and active in his daily life.  He still requires Ambien as needed.  He denies any active or passive suicidal thoughts or homicidal thoughts.  He has any hallucination.  He still smokes and does not want to quit.  He is very reluctant to take any medication .   I reviewed discharge summary and neurologist notes that he was admitted in the hospital.  There has been no indication from neurologist that medication has been recommended to stop however patient told that he see neurologist in his office recently and discussed in detail about his medication who recommend to stop Depakote and Aricept.    Current psychiatric medication Ambien 10 mg as needed prescribed by his neurologist.  Past psychiatric history Patient denies any history of previous psychiatric inpatient treatment or any suicidal attempt. He denies any history of paranoid thinking delusions or violent behavior. He do not remember taking any psychotropic medication other than Ambien which is  given by his primary care physician and he's been taking for past 3 years.  Medical history Patient was admitted recently in the hospital due to change mental status.  His discharge diagnoses is TIA .  Patient has history of hypertension, hyperlipidemia, chronic back pain and memory issues. His primary care physician is Dr. Gerarda Fraction.  His neurologist is Dr. Paulita Cradle.    Psychosocial history Patient is born and raised in [place]. This is his third marriage. His first marriage lasted for 5 years. He has 2 sons from his first marriage however patient has no contact with his first wife and children. Patient's second marriage last only for one year.  Patient has 3 children from his third wife. Patient is 60 year-old son and 16 year old son and 35 year old son. Patient denies any history of sexual verbal or emotional abuse.  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  Family history She denies any family history of psychiatric illness.  Educational background Patient has high school education.  Alcohol and substance use history Patient endorsed history of heavy alcoholism when he was in [employer]. He admitted having DWI in in past. He also endorse history of blackout due to heavy drinking. He denies any recent use of alcohol. He denies any history of illegal substances.  Mental status examination Patient is elderly man who is casually dressed and fairly groomed. He he is calm, cooperative and pleasant.  His his speech is  clear and coherent.  His thought process is slow but logical linear and goal-directed.  He denies any auditory or visual hallucination.  He denies any active or passive suicidal thoughts or homicidal thoughts.  There were no psychotic symptoms present at this time.  His attention and concentration is fair.  He described his mood is okay and his affect is mood congruent.  There no tremors or shakes present.  He's alert and oriented x3.  His  insight judgment and impulse control is okay.  Diagnoses Axis I mood disorder NOS rule out mood disorder due to general medical condition Axis II deferred Axis III see medical history Axis IV moderate Axis V 65-70  Plan I review his discharge summary, to stop medication and update history .  At this time patient does not want to take his Depakote .  He told this medicine has been discontinued by his neurologist however we do not have any evidence that neurologist to stop these medication.  Patient denies any recent anger issues.  He still has medical issues and I highly recommend to see therapist for coping and social skills.  Patient like to keep appointment with her therapist .  I also discussed with him and his wife that in future if he started to feel that his symptoms are coming back that he should contact us immediately.  At this time he does not feel that he need Depakote.  I will not were white any new prescription.  I recommend to call us in the future if he needed.  I will see him again in 3 months to monitor his symptoms.  Time spent 30 minutes.  Portion of this note is generated with voice dictation software and may contain typographical error.

## 2012-09-09 ENCOUNTER — Ambulatory Visit (INDEPENDENT_AMBULATORY_CARE_PROVIDER_SITE_OTHER): Payer: Medicare Other | Admitting: Psychology

## 2012-09-09 ENCOUNTER — Encounter (HOSPITAL_COMMUNITY): Payer: Self-pay | Admitting: Psychology

## 2012-09-09 DIAGNOSIS — F411 Generalized anxiety disorder: Secondary | ICD-10-CM | POA: Diagnosis not present

## 2012-09-09 DIAGNOSIS — F0150 Vascular dementia without behavioral disturbance: Secondary | ICD-10-CM

## 2012-09-09 DIAGNOSIS — F419 Anxiety disorder, unspecified: Secondary | ICD-10-CM

## 2012-09-09 DIAGNOSIS — I679 Cerebrovascular disease, unspecified: Secondary | ICD-10-CM

## 2012-09-09 NOTE — Progress Notes (Signed)
Patient:  Glenn [name]   DOB: 27-Mar-1941  MR Number: [id number]  Location: [facility] [address] [place] [address] Dept: [phone]  Start: 11 AM End: 12 PM  Provider/Observer:     Edgardo Roys PSYD  Chief Complaint:      Chief Complaint  Patient presents with  . Memory Loss  . Agitation  . Anxiety    Reason For Service:     The patient was referred by his family members because of increasing concerns about the possibility of Alzheimer's or other medical issues related to memory loss. The patient is in a general denial about the severity of the symptoms. The patient does knowledge that he forgets a few things here and there and may have gotten turned around a few times and gotten lost driving but overall the patient reports he is doing well. However, the patient's wife reports that there are significant problems. In 2002 he had a stroke. The patient reports said he woke up and felt numbness in his right arm. He was diagnosed with a transient ischemic attack. There is a strong family history of TIAs and high blood pressure the patient presented at the [facility] and they ran some tests and gave him some medications but did not feel that he had a major stroke. Since that time any of these symptoms have cleared up and he has had no apparent symptoms from this problem. However, the patient's wife reports that this past summer the patient would get very angry over the smallest of things. This anger and frustration also included suicidal ideation which is why he originally presented that the neurologist. The patient has an 71 year old, 71 year old, an 53 year old child. The patient has been more agitated and sharp with them and there are other times where he is described as having geographic disorientation. The patient forgets what he is going to say and then experiences or displays  other expressive language problems including circumlocutions, paraphrasic errors, as well as significant indications of combat he would show an without the appearance of knowledge of the inaccuracies of his statements. 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 A long history of anxiety and obsessive-compulsive traits are also present.   Interventions Strategy:  Cognitive/behavioral psychotherapeutic interventions along with working with the patient and his wife.  Participation Level:   Active  Participation Quality:  Appropriate      Behavioral Observation:  Well Groomed, Alert, and Labile.   Current Psychosocial Factors: After my feedback to the patient he decided that the fact that I talked him not having Alzheimer's is much more likely been related to vascular events with possible seizure activity the patient to get to mean that he should stop his medications both the Depakote and the Aricept. However, while the Aricept is likely to of been unhelpful to them and they do acknowledge that there's been no worsening of his memory after stopping his medications in fact he has been a little better the possibility of seizures remain and there was a long discussion about continuing antiseizure medications and that he should go back and see his neurologist about restarting these medications.  Content of Session:   Reviewed current symptoms and worked on both interventions the patient as well as his wife can do to deal with these ongoing conflict.  Current Status:   While the patient has clearly shown some  progressive memory and cognitive declines he remains capable of learning new information and remembering multiple aspects of his life. However, the changes particularly in personality that are developing are creating reviving significant long-term conflicts within the family.  Patient Progress:   Stable  Target  Goals:   Target goals include improving the patient's coping skills but more importantly the dynamics between he and his family including he and his wife as well as his children. Specific advice has been given with regard to efforts with his wife.  Last Reviewed:   09/09/2012  Goals Addressed Today:    Today we worked on more family dynamic issues and giving advice to the patient's wife.  Impression/Diagnosis:   Repeat neuropsychological evaluation with direct comparisons to previous testing do not show any progressive cognitive loss between the 2 dates of testing. While one early consideration was at Alzheimer's was playing a role even though he had a pretty existing history of stroke with no further strokes the most recent results suggest that this is not Alzheimer's but is in fact related to vascular issues. Since this evaluation was completed he had another neurological event that was related to a transient ischemic attack/small intracranial bleed and possible seizure. I provided feedback to the patient and his wife about the fact that it does not look like it is Alzheimer's and encourage the patient to restart any antiseizure medications.     Diagnosis:    Axis I:  1. Dementia, multi-infarct   2. Small vessel disease, cerebrovascular   3. Anxiety disorder         Axis II: No diagnosis

## 2012-09-21 ENCOUNTER — Telehealth (HOSPITAL_COMMUNITY): Payer: Self-pay | Admitting: *Deleted

## 2012-09-26 ENCOUNTER — Ambulatory Visit (INDEPENDENT_AMBULATORY_CARE_PROVIDER_SITE_OTHER): Payer: Medicare Other | Admitting: Psychology

## 2012-09-26 ENCOUNTER — Encounter (HOSPITAL_COMMUNITY): Payer: Self-pay | Admitting: Psychology

## 2012-09-26 DIAGNOSIS — F0150 Vascular dementia without behavioral disturbance: Secondary | ICD-10-CM

## 2012-09-26 DIAGNOSIS — F411 Generalized anxiety disorder: Secondary | ICD-10-CM | POA: Diagnosis not present

## 2012-09-26 DIAGNOSIS — I672 Cerebral atherosclerosis: Secondary | ICD-10-CM

## 2012-09-26 DIAGNOSIS — F419 Anxiety disorder, unspecified: Secondary | ICD-10-CM

## 2012-09-26 DIAGNOSIS — I679 Cerebrovascular disease, unspecified: Secondary | ICD-10-CM

## 2012-09-26 NOTE — Progress Notes (Signed)
Patient:  Glenn [name]   DOB: Mar 12, 1941  MR Number: [id number]  Location: [facility] [address] [place] [address] Dept: [phone]  Start: 10 AM End: 11 AM  Provider/Observer:     Edgardo Roys PSYD  Chief Complaint:      Chief Complaint  Patient presents with  . Anxiety  . Memory Loss    Reason For Service:     The patient was referred by his family members because of increasing concerns about the possibility of Alzheimer's or other medical issues related to memory loss. The patient is in a general denial about the severity of the symptoms. The patient does knowledge that he forgets a few things here and there and may have gotten turned around a few times and gotten lost driving but overall the patient reports he is doing well. However, the patient's wife reports that there are significant problems. In 2002 he had a stroke. The patient reports said he woke up and felt numbness in his right arm. He was diagnosed with a transient ischemic attack. There is a strong family history of TIAs and high blood pressure the patient presented at the [facility] and they ran some tests and gave him some medications but did not feel that he had a major stroke. Since that time any of these symptoms have cleared up and he has had no apparent symptoms from this problem. However, the patient's wife reports that this past summer the patient would get very angry over the smallest of things. This anger and frustration also included suicidal ideation which is why he originally presented that the neurologist. The patient has an 71 year old, 70 year old, an 71 year old child. The patient has been more agitated and sharp with them and there are other times where he is described as having geographic disorientation. The patient forgets what he is going to say and then experiences or displays other expressive  language problems including circumlocutions, paraphrasic errors, as well as significant indications of combat he would show an without the appearance of knowledge of the inaccuracies of his statements. 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 A long history of anxiety and obsessive-compulsive traits are also present.   Interventions Strategy:  Cognitive/behavioral psychotherapeutic interventions along with working with the patient and his wife.  Participation Level:   Active  Participation Quality:  Appropriate      Behavioral Observation:  Well Groomed, Alert, and Labile.   Current Psychosocial Factors: There continue to be a lot of psychosocial stressors between the patient, his wife, and his children. This is the children that are living in his house with him as well as his older children appropriate use marriage. The patient is some competitive with these and there said he felt feelings of resentment and anger that it is hard to get through..  Content of Session:   The patient has agreed to start taking the anticonvulsant medications again but his neurologist. Referred that we start. I will talk to Dr. Adele Schilder about this. .  Current Status:   While the patient has clearly shown some progressive memory and cognitive declines he remains capable of learning new information and remembering multiple aspects of his life. However, the changes particularly in personality that are developing are creating reviving significant long-term conflicts within the family.  Patient Progress:   Stable  Target Goals:   Target goals include  improving the patient's coping skills but more importantly the dynamics between he and his family including he and his wife as well as his children. Specific advice has been given with regard to efforts with his wife.  Last Reviewed:   09/26/2012  Goals Addressed Today:    Today we  worked on more family dynamic issues and giving advice to the patient's wife.  Impression/Diagnosis:   Repeat neuropsychological evaluation with direct comparisons to previous testing do not show any progressive cognitive loss between the 2 dates of testing. While one early consideration was at Alzheimer's was playing a role even though he had a pretty existing history of stroke with no further strokes the most recent results suggest that this is not Alzheimer's but is in fact related to vascular issues. Since this evaluation was completed he had another neurological event that was related to a transient ischemic attack/small intracranial bleed and possible seizure. I provided feedback to the patient and his wife about the fact that it does not look like it is Alzheimer's and encourage the patient to restart any antiseizure medications.     Diagnosis:    Axis I:  1. Dementia, multi-infarct   2. Small vessel disease, cerebrovascular   3. Anxiety         Axis II: No diagnosis

## 2012-09-30 ENCOUNTER — Other Ambulatory Visit (HOSPITAL_COMMUNITY): Payer: Self-pay | Admitting: Psychiatry

## 2012-10-03 ENCOUNTER — Other Ambulatory Visit (HOSPITAL_COMMUNITY): Payer: Self-pay | Admitting: Psychiatry

## 2012-10-03 MED ORDER — DIVALPROEX SODIUM ER 500 MG PO TB24: 500.0000 mg | ORAL_TABLET | Freq: Two times a day (BID) | ORAL | Status: DC

## 2012-10-17 ENCOUNTER — Other Ambulatory Visit (HOSPITAL_COMMUNITY): Payer: Self-pay | Admitting: Psychiatry

## 2012-10-17 ENCOUNTER — Other Ambulatory Visit (HOSPITAL_COMMUNITY): Payer: Self-pay | Admitting: *Deleted

## 2012-10-19 DIAGNOSIS — Z79899 Other long term (current) drug therapy: Secondary | ICD-10-CM | POA: Diagnosis not present

## 2012-10-19 DIAGNOSIS — R569 Unspecified convulsions: Secondary | ICD-10-CM | POA: Diagnosis not present

## 2012-10-19 DIAGNOSIS — R413 Other amnesia: Secondary | ICD-10-CM | POA: Diagnosis not present

## 2012-10-26 ENCOUNTER — Ambulatory Visit (INDEPENDENT_AMBULATORY_CARE_PROVIDER_SITE_OTHER): Payer: Medicare Other | Admitting: Psychology

## 2012-10-26 DIAGNOSIS — F411 Generalized anxiety disorder: Secondary | ICD-10-CM

## 2012-10-26 DIAGNOSIS — I679 Cerebrovascular disease, unspecified: Secondary | ICD-10-CM | POA: Diagnosis not present

## 2012-10-26 DIAGNOSIS — F0150 Vascular dementia without behavioral disturbance: Secondary | ICD-10-CM

## 2012-10-26 DIAGNOSIS — F419 Anxiety disorder, unspecified: Secondary | ICD-10-CM

## 2012-10-31 DIAGNOSIS — R700 Elevated erythrocyte sedimentation rate: Secondary | ICD-10-CM | POA: Diagnosis not present

## 2012-10-31 DIAGNOSIS — E559 Vitamin D deficiency, unspecified: Secondary | ICD-10-CM | POA: Diagnosis not present

## 2012-10-31 DIAGNOSIS — D649 Anemia, unspecified: Secondary | ICD-10-CM | POA: Diagnosis not present

## 2012-11-02 DIAGNOSIS — N182 Chronic kidney disease, stage 2 (mild): Secondary | ICD-10-CM | POA: Diagnosis not present

## 2012-11-02 DIAGNOSIS — E559 Vitamin D deficiency, unspecified: Secondary | ICD-10-CM | POA: Diagnosis not present

## 2012-11-02 DIAGNOSIS — R809 Proteinuria, unspecified: Secondary | ICD-10-CM | POA: Diagnosis not present

## 2012-11-02 DIAGNOSIS — I10 Essential (primary) hypertension: Secondary | ICD-10-CM | POA: Diagnosis not present

## 2012-11-09 ENCOUNTER — Encounter (HOSPITAL_COMMUNITY): Payer: Self-pay | Admitting: Psychology
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
## 2012-11-18 DIAGNOSIS — N189 Chronic kidney disease, unspecified: Secondary | ICD-10-CM | POA: Diagnosis not present

## 2012-11-18 DIAGNOSIS — Z79899 Other long term (current) drug therapy: Secondary | ICD-10-CM | POA: Diagnosis not present

## 2012-11-21 DIAGNOSIS — Z23 Encounter for immunization: Secondary | ICD-10-CM | POA: Diagnosis not present

## 2012-11-30 ENCOUNTER — Ambulatory Visit (HOSPITAL_COMMUNITY): Payer: Self-pay | Admitting: Psychology

## 2012-12-06 ENCOUNTER — Ambulatory Visit (HOSPITAL_COMMUNITY): Payer: Self-pay | Admitting: Psychiatry

## 2012-12-30 ENCOUNTER — Telehealth (HOSPITAL_COMMUNITY): Payer: Self-pay | Admitting: Psychology

## 2013-01-09 IMAGING — CT CT HEAD W/O CM
1 series · 15 of 30 positions shown, 19 images · non-contrast
Comparison: 09/18/2011

CLINICAL DATA: Left-sided weakness

CT HEAD WITHOUT CONTRAST
TECHNIQUE: Contiguous axial images were obtained from the base of
the skull through the vertex without contrast.

[Series 2: headseq 4.8 h37s · axial · 0.43mm/px · z∈[+78,+216]mm · 15 of 30 slices shown, 19 images]
[im 2/30  brain]
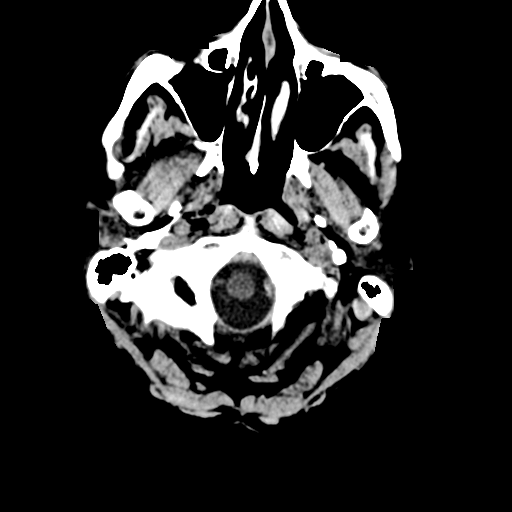
[im 2/30  bone]
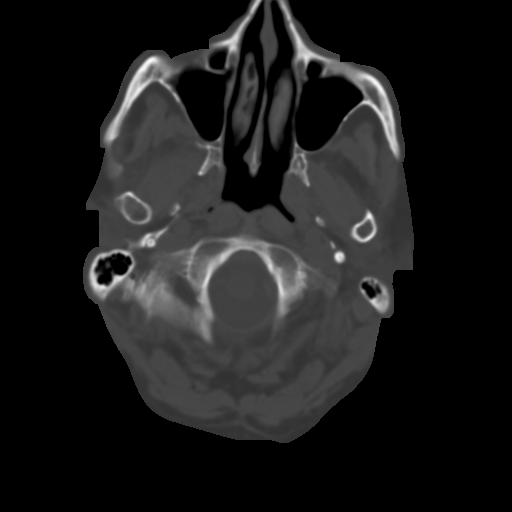
[im 4/30  brain]
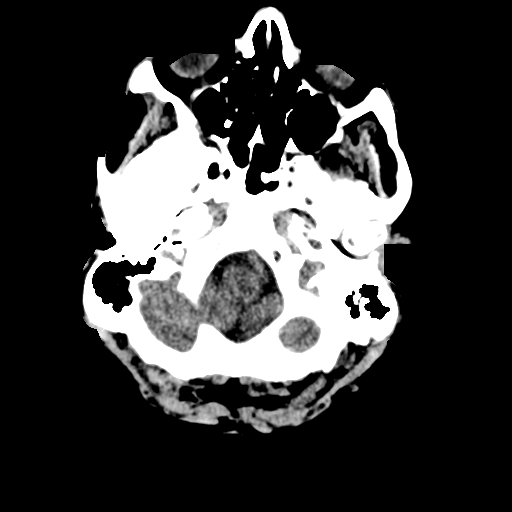
[im 6/30  brain]
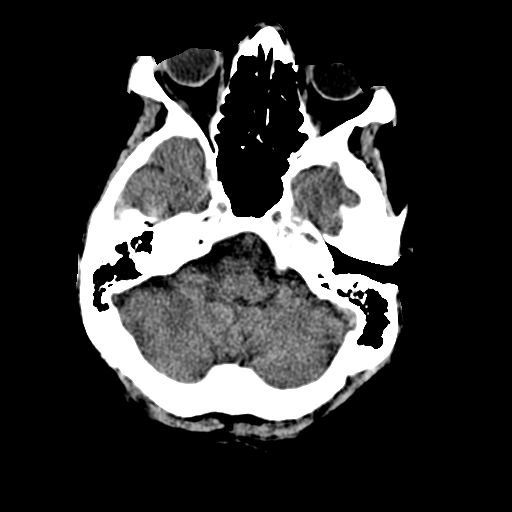
[im 8/30  brain]
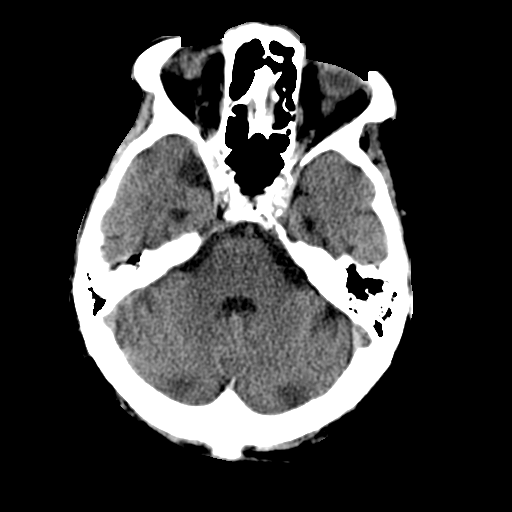
[im 10/30  brain]
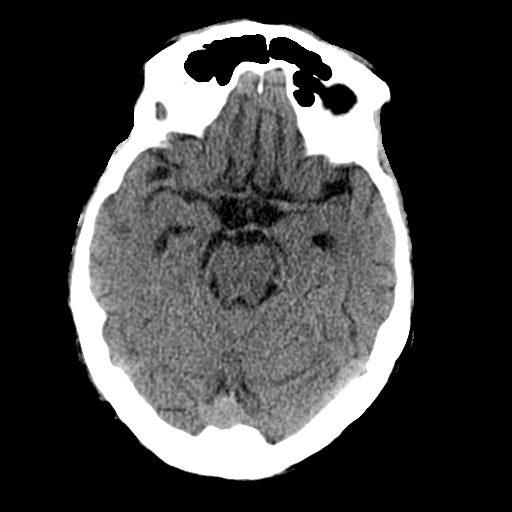
[im 10/30  bone]
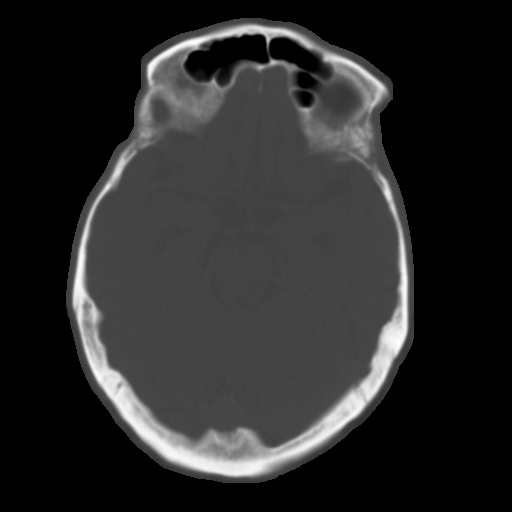
[im 12/30  brain]
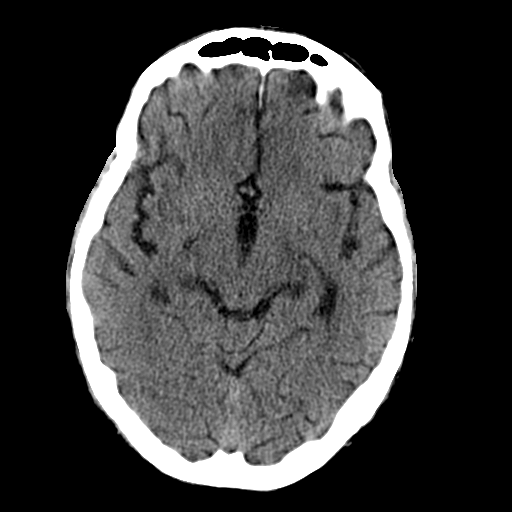
[im 14/30  brain]
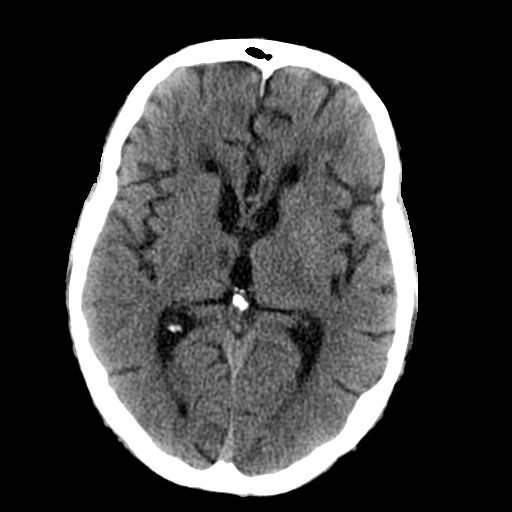
[im 16/30  brain]
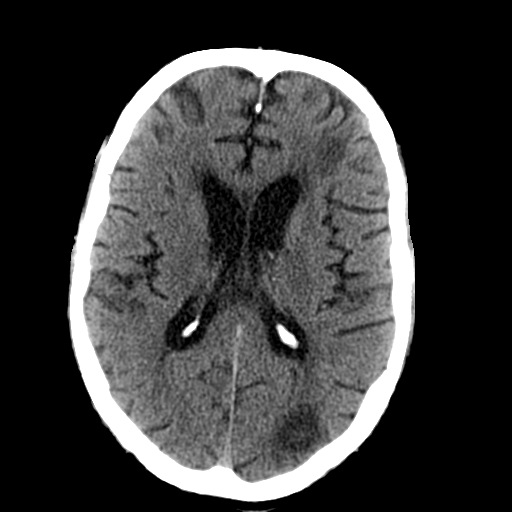
[im 17/30  brain]
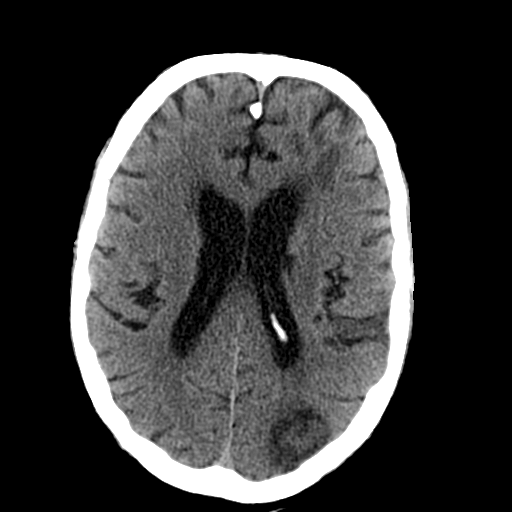
[im 17/30  bone]
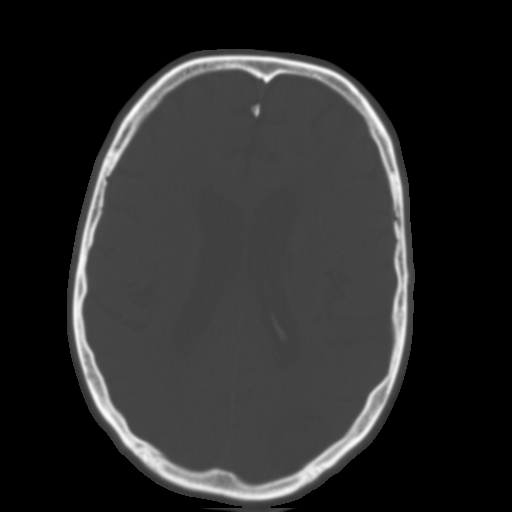
[im 19/30  brain]
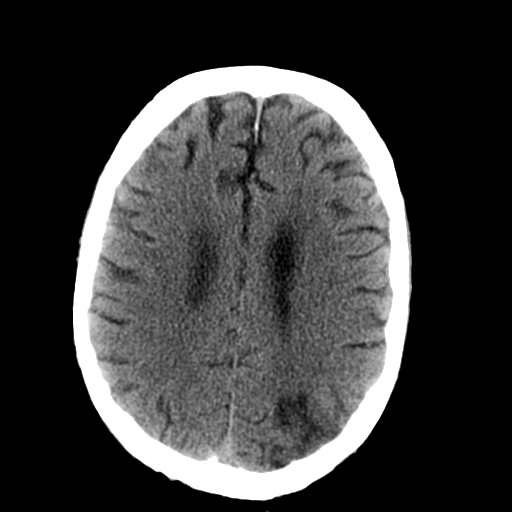
[im 21/30  brain]
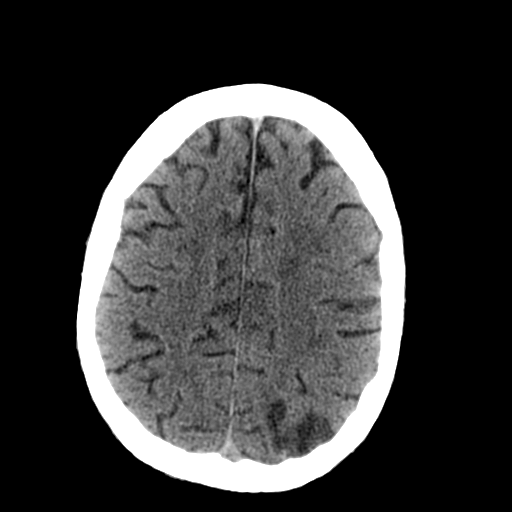
[im 23/30  brain]
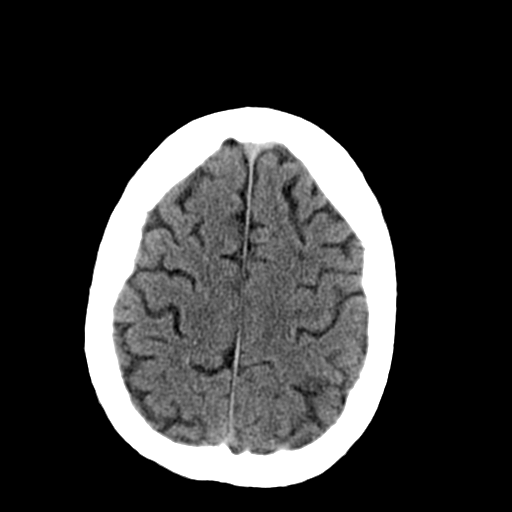
[im 25/30  brain]
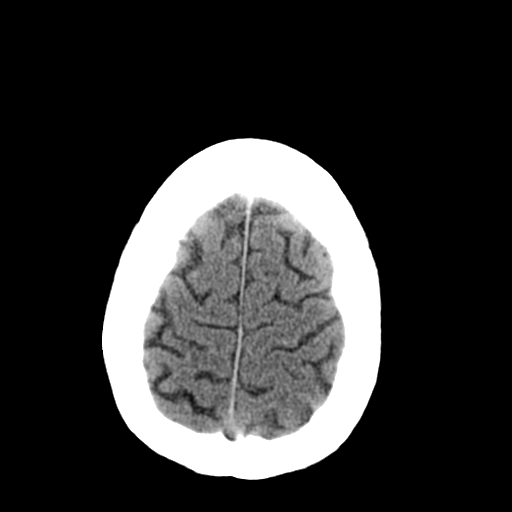
[im 25/30  bone]
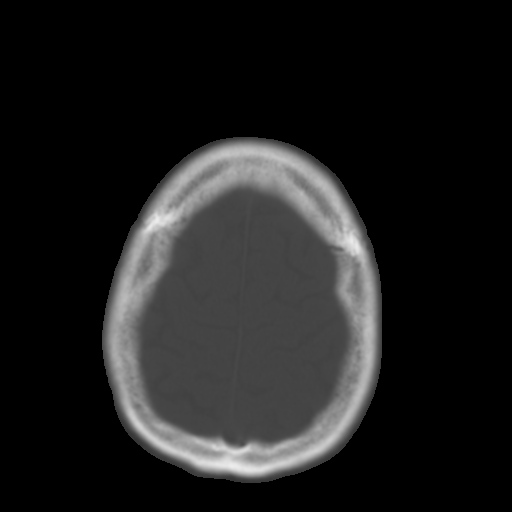
[im 27/30  brain]
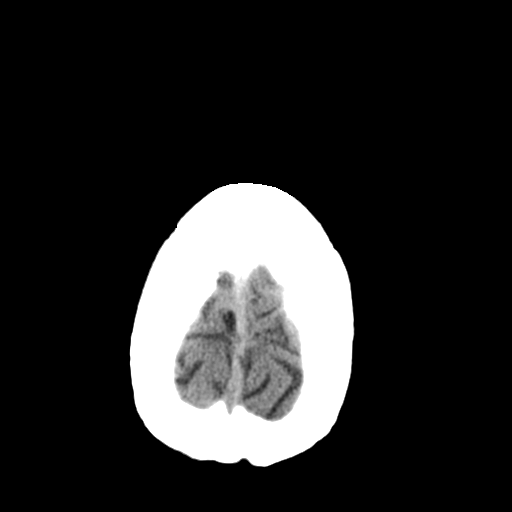
[im 29/30  brain]
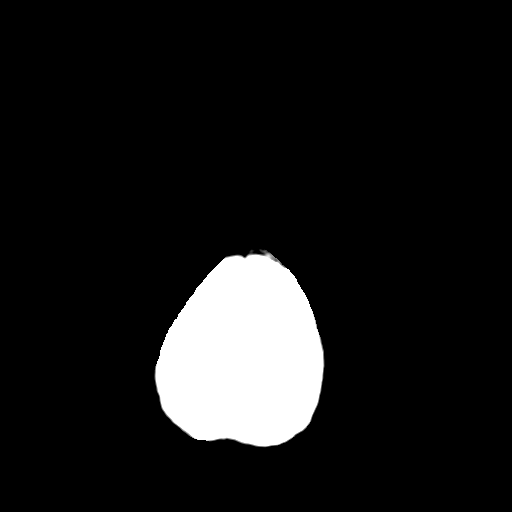

[15 of 30 positions shown; findings below may reference images not displayed]

FINDINGS: There is a mixed density mass within the left posterior
parietal/occipital lobe.  This measures 2.1 x 2.9 cm and is new
when compared with the previous examination.  The surrounding low
density edema is identified.

There is diffuse patchy low density throughout the subcortical and
periventricular white matter consistent with chronic small vessel
ischemic change.

There is prominence of the sulci and ventricles consistent with
brain atrophy.

There is no evidence for intracranial hemorrhage.  No significant
midline shift.

The paranasal sinuses and mastoid air cells are clear.  The skull
is intact.
IMPRESSION: 1.  Indeterminant, mixed density structure within the left
posterior cerebral hemisphere. Cannot rule out tumor.  This may be
primary or metastatic to the brain.  Recommend further evaluation
with contrast enhanced MRI.
2.  Small vessel ischemic disease and brain atrophy.

These results will be called to the ordering clinician or
representative by the Radiologist Assistant, and communication
documented in the PACS Dashboard.

## 2013-01-09 IMAGING — CR DG CHEST 2V
2 series · 2 of 2 positions shown · non-contrast
Comparison: Chest radiograph 11/27/2011

CLINICAL DATA: Hypertension, hyperlipidemia, left-sided weakness

CHEST - 2 VIEW

[view not recorded (1 of 2)]
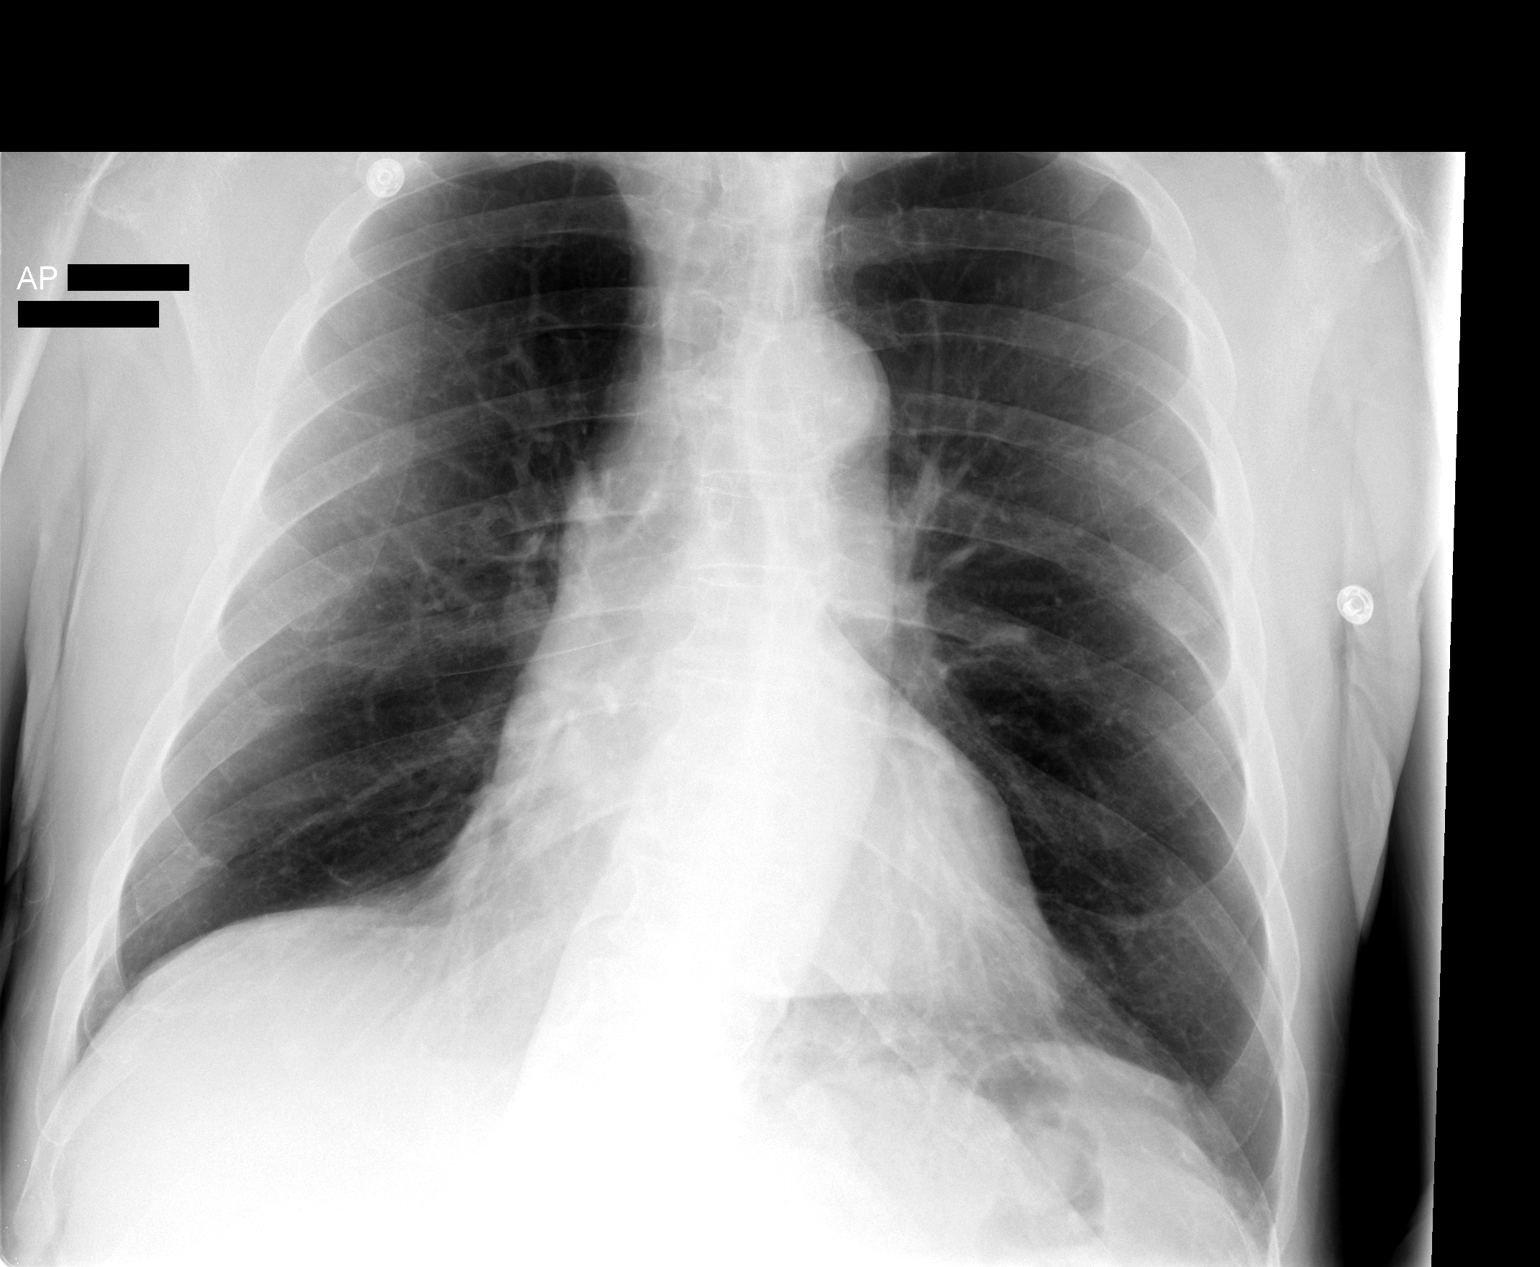

[view not recorded (2 of 2)]
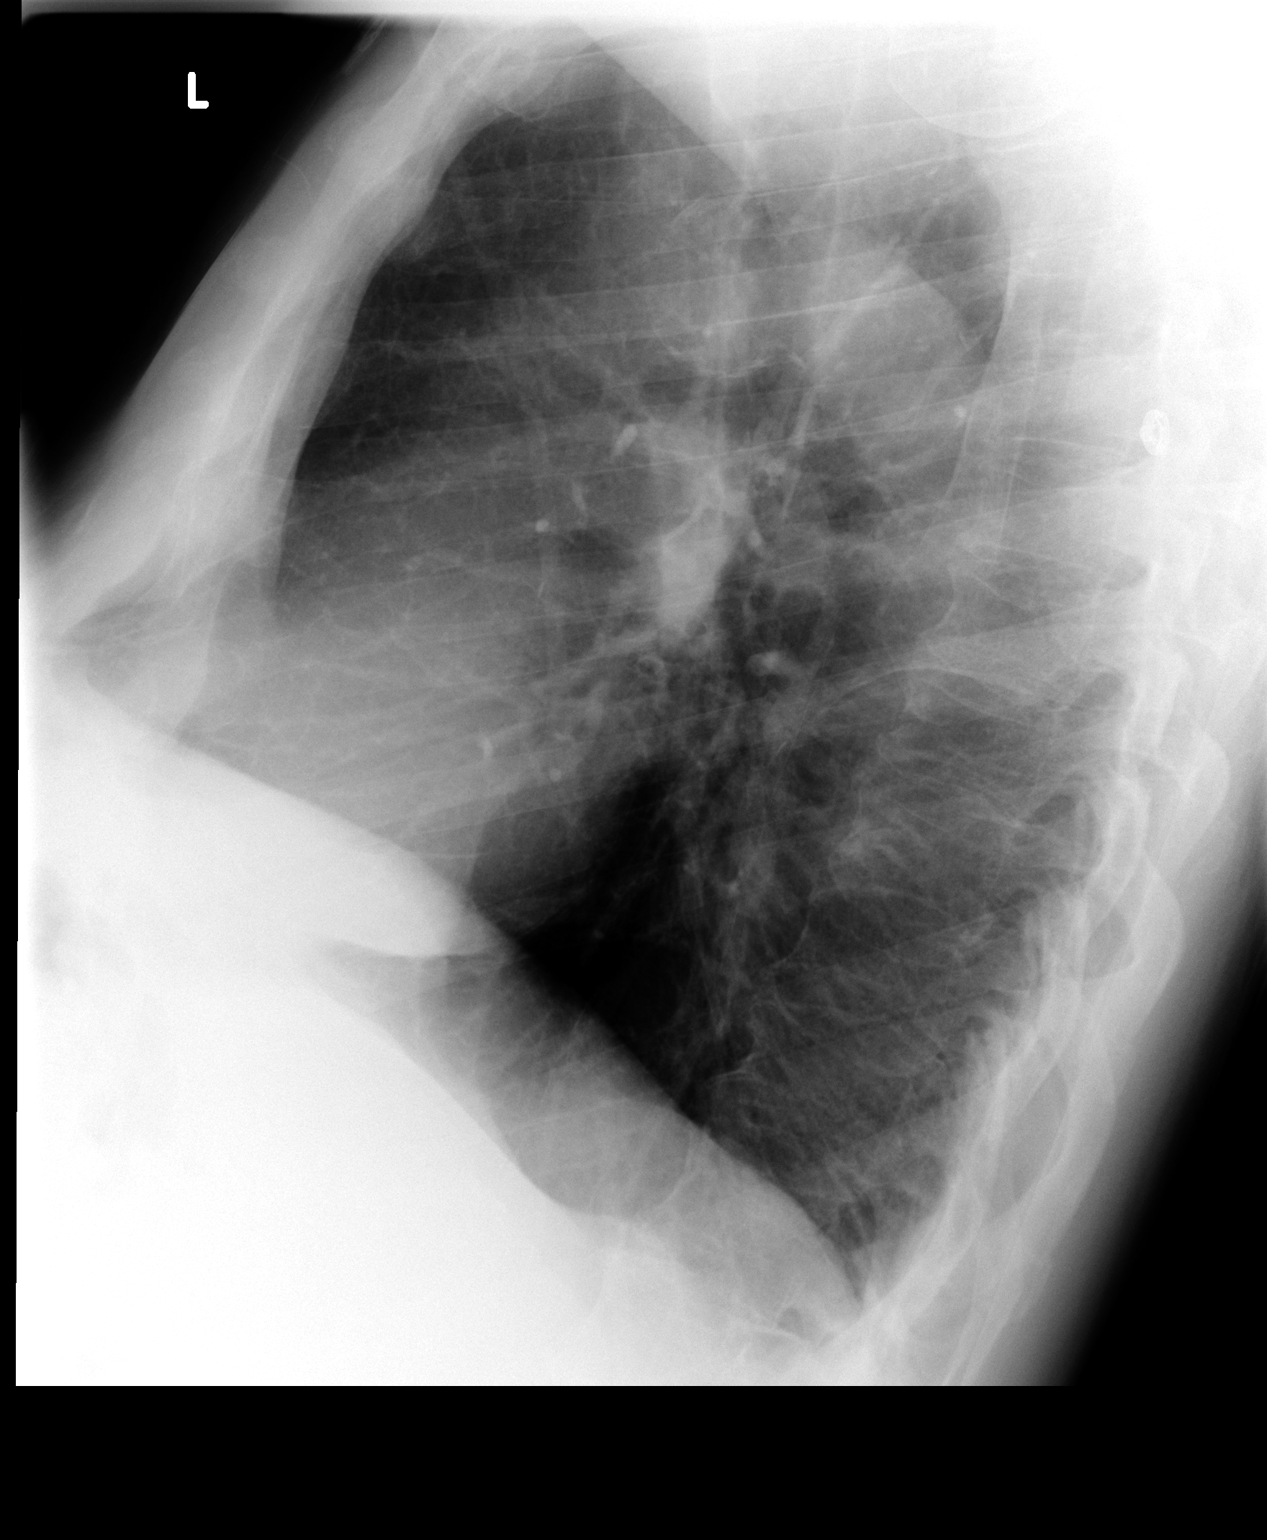

[2 of 2 positions shown; findings below may reference images not displayed]

FINDINGS: Normal cardiac silhouette.  Aorta is ectatic.  No
effusion, infiltrate, or pneumothorax.  There is flattening of the
hemidiaphragms. Degenerative osteophytosis of the thoracic spine..
There is a compression deformities of the mid thoracic spine which
are progressed when compared to prior.
IMPRESSION: 1.   No acute cardiopulmonary findings.

2.  Hyperinflated lungs.

3.   Chronic compression deformities of the thoracic spine.

## 2013-04-10 DIAGNOSIS — Z79899 Other long term (current) drug therapy: Secondary | ICD-10-CM | POA: Diagnosis not present

## 2013-04-10 DIAGNOSIS — E559 Vitamin D deficiency, unspecified: Secondary | ICD-10-CM | POA: Diagnosis not present

## 2013-04-10 DIAGNOSIS — N189 Chronic kidney disease, unspecified: Secondary | ICD-10-CM | POA: Diagnosis not present

## 2013-04-10 DIAGNOSIS — I10 Essential (primary) hypertension: Secondary | ICD-10-CM | POA: Diagnosis not present

## 2013-04-10 DIAGNOSIS — D649 Anemia, unspecified: Secondary | ICD-10-CM | POA: Diagnosis not present

## 2013-04-10 DIAGNOSIS — R809 Proteinuria, unspecified: Secondary | ICD-10-CM | POA: Diagnosis not present

## 2013-04-22 DIAGNOSIS — R809 Proteinuria, unspecified: Secondary | ICD-10-CM | POA: Diagnosis not present

## 2013-04-22 DIAGNOSIS — E559 Vitamin D deficiency, unspecified: Secondary | ICD-10-CM | POA: Diagnosis not present

## 2013-04-22 DIAGNOSIS — I10 Essential (primary) hypertension: Secondary | ICD-10-CM | POA: Diagnosis not present

## 2013-05-01 DIAGNOSIS — IMO0002 Reserved for concepts with insufficient information to code with codable children: Secondary | ICD-10-CM | POA: Diagnosis not present

## 2013-05-01 DIAGNOSIS — G8929 Other chronic pain: Secondary | ICD-10-CM | POA: Diagnosis not present

## 2013-05-01 DIAGNOSIS — G4700 Insomnia, unspecified: Secondary | ICD-10-CM | POA: Diagnosis not present

## 2013-05-04 DIAGNOSIS — N189 Chronic kidney disease, unspecified: Secondary | ICD-10-CM | POA: Diagnosis not present

## 2013-05-04 DIAGNOSIS — I10 Essential (primary) hypertension: Secondary | ICD-10-CM | POA: Diagnosis not present

## 2013-05-04 DIAGNOSIS — R809 Proteinuria, unspecified: Secondary | ICD-10-CM | POA: Diagnosis not present

## 2013-05-04 DIAGNOSIS — Z79899 Other long term (current) drug therapy: Secondary | ICD-10-CM | POA: Diagnosis not present

## 2013-06-01 DIAGNOSIS — N400 Enlarged prostate without lower urinary tract symptoms: Secondary | ICD-10-CM | POA: Diagnosis not present

## 2013-06-01 DIAGNOSIS — N529 Male erectile dysfunction, unspecified: Secondary | ICD-10-CM | POA: Diagnosis not present

## 2013-06-01 DIAGNOSIS — R972 Elevated prostate specific antigen [PSA]: Secondary | ICD-10-CM | POA: Diagnosis not present

## 2013-07-03 DIAGNOSIS — IMO0002 Reserved for concepts with insufficient information to code with codable children: Secondary | ICD-10-CM | POA: Diagnosis not present

## 2013-07-03 DIAGNOSIS — J310 Chronic rhinitis: Secondary | ICD-10-CM | POA: Diagnosis not present

## 2013-07-19 DIAGNOSIS — N529 Male erectile dysfunction, unspecified: Secondary | ICD-10-CM | POA: Diagnosis not present

## 2013-07-19 DIAGNOSIS — N400 Enlarged prostate without lower urinary tract symptoms: Secondary | ICD-10-CM | POA: Diagnosis not present

## 2013-08-21 DIAGNOSIS — R809 Proteinuria, unspecified: Secondary | ICD-10-CM | POA: Diagnosis not present

## 2013-08-21 DIAGNOSIS — E559 Vitamin D deficiency, unspecified: Secondary | ICD-10-CM | POA: Diagnosis not present

## 2013-08-21 DIAGNOSIS — D649 Anemia, unspecified: Secondary | ICD-10-CM | POA: Diagnosis not present

## 2013-08-21 DIAGNOSIS — N189 Chronic kidney disease, unspecified: Secondary | ICD-10-CM | POA: Diagnosis not present

## 2013-08-21 DIAGNOSIS — Z79899 Other long term (current) drug therapy: Secondary | ICD-10-CM | POA: Diagnosis not present

## 2013-08-21 DIAGNOSIS — I10 Essential (primary) hypertension: Secondary | ICD-10-CM | POA: Diagnosis not present

## 2013-08-23 DIAGNOSIS — R809 Proteinuria, unspecified: Secondary | ICD-10-CM | POA: Diagnosis not present

## 2013-08-23 DIAGNOSIS — I10 Essential (primary) hypertension: Secondary | ICD-10-CM | POA: Diagnosis not present

## 2013-09-26 DIAGNOSIS — Z23 Encounter for immunization: Secondary | ICD-10-CM | POA: Diagnosis not present

## 2013-11-03 ENCOUNTER — Inpatient Hospital Stay (HOSPITAL_COMMUNITY): Payer: Medicare Other

## 2013-11-03 ENCOUNTER — Emergency Department (HOSPITAL_COMMUNITY): Payer: Medicare Other

## 2013-11-03 ENCOUNTER — Inpatient Hospital Stay (HOSPITAL_COMMUNITY)
Admission: EM | Admit: 2013-11-03 | Discharge: 2013-11-04 | DRG: 065 | Discharge status: Against Medical Advice | Payer: Medicare Other | Attending: Internal Medicine | Admitting: Internal Medicine

## 2013-11-03 ENCOUNTER — Encounter (HOSPITAL_COMMUNITY): Payer: Self-pay | Admitting: Emergency Medicine

## 2013-11-03 DIAGNOSIS — I639 Cerebral infarction, unspecified: Secondary | ICD-10-CM

## 2013-11-03 DIAGNOSIS — R471 Dysarthria and anarthria: Secondary | ICD-10-CM | POA: Diagnosis present

## 2013-11-03 DIAGNOSIS — J449 Chronic obstructive pulmonary disease, unspecified: Secondary | ICD-10-CM

## 2013-11-03 DIAGNOSIS — N179 Acute kidney failure, unspecified: Secondary | ICD-10-CM | POA: Diagnosis present

## 2013-11-03 DIAGNOSIS — Z8673 Personal history of transient ischemic attack (TIA), and cerebral infarction without residual deficits: Secondary | ICD-10-CM | POA: Diagnosis not present

## 2013-11-03 DIAGNOSIS — Z8249 Family history of ischemic heart disease and other diseases of the circulatory system: Secondary | ICD-10-CM | POA: Diagnosis not present

## 2013-11-03 DIAGNOSIS — J4489 Other specified chronic obstructive pulmonary disease: Secondary | ICD-10-CM | POA: Diagnosis not present

## 2013-11-03 DIAGNOSIS — I10 Essential (primary) hypertension: Secondary | ICD-10-CM | POA: Diagnosis present

## 2013-11-03 DIAGNOSIS — E785 Hyperlipidemia, unspecified: Secondary | ICD-10-CM | POA: Diagnosis not present

## 2013-11-03 DIAGNOSIS — I658 Occlusion and stenosis of other precerebral arteries: Secondary | ICD-10-CM | POA: Diagnosis not present

## 2013-11-03 DIAGNOSIS — R4701 Aphasia: Secondary | ICD-10-CM | POA: Diagnosis present

## 2013-11-03 DIAGNOSIS — I6350 Cerebral infarction due to unspecified occlusion or stenosis of unspecified cerebral artery: Principal | ICD-10-CM

## 2013-11-03 DIAGNOSIS — F17200 Nicotine dependence, unspecified, uncomplicated: Secondary | ICD-10-CM | POA: Diagnosis present

## 2013-11-03 DIAGNOSIS — Z720 Tobacco use: Secondary | ICD-10-CM | POA: Diagnosis present

## 2013-11-03 DIAGNOSIS — I6529 Occlusion and stenosis of unspecified carotid artery: Secondary | ICD-10-CM | POA: Diagnosis present

## 2013-11-03 DIAGNOSIS — G8929 Other chronic pain: Secondary | ICD-10-CM | POA: Diagnosis present

## 2013-11-03 DIAGNOSIS — K219 Gastro-esophageal reflux disease without esophagitis: Secondary | ICD-10-CM | POA: Diagnosis present

## 2013-11-03 DIAGNOSIS — E860 Dehydration: Secondary | ICD-10-CM | POA: Diagnosis present

## 2013-11-03 DIAGNOSIS — Z823 Family history of stroke: Secondary | ICD-10-CM

## 2013-11-03 HISTORY — DX: Tobacco use: Z72.0

## 2013-11-03 LAB — COMPREHENSIVE METABOLIC PANEL
Alkaline Phosphatase: 84 U/L (ref 39–117)
BUN: 18 mg/dL (ref 6–23)
Calcium: 9.2 mg/dL (ref 8.4–10.5)
Creatinine, Ser: 1.38 mg/dL — ABNORMAL HIGH (ref 0.50–1.35)
GFR calc Af Amer: 57 mL/min — ABNORMAL LOW (ref >90)
Glucose, Bld: 101 mg/dL — ABNORMAL HIGH (ref 70–99)
Total Protein: 7 g/dL (ref 6.0–8.3)

## 2013-11-03 LAB — CBC WITH DIFFERENTIAL/PLATELET
Basophils Absolute: 0 10*3/uL (ref 0.0–0.1)
HCT: 41.8 % (ref 39.0–52.0)
Hemoglobin: 14.3 g/dL (ref 13.0–17.0)
Lymphocytes Relative: 21 % (ref 12–46)
Monocytes Absolute: 0.9 10*3/uL (ref 0.1–1.0)
Neutro Abs: 6.9 10*3/uL (ref 1.7–7.7)
RDW: 14.7 % (ref 11.5–15.5)
WBC: 10.1 10*3/uL (ref 4.0–10.5)

## 2013-11-03 LAB — URINALYSIS, ROUTINE W REFLEX MICROSCOPIC
Bilirubin Urine: NEGATIVE
Glucose, UA: NEGATIVE mg/dL
Hgb urine dipstick: NEGATIVE
Protein, ur: 100 mg/dL — AB
Urobilinogen, UA: 0.2 mg/dL (ref 0.0–1.0)

## 2013-11-03 LAB — TROPONIN I: Troponin I: <0.3 ng/mL (ref <0.30)

## 2013-11-03 LAB — LIPID PANEL
HDL: 36 mg/dL — ABNORMAL LOW (ref >39)
LDL Cholesterol: 147 mg/dL — ABNORMAL HIGH (ref 0–99)
Triglycerides: 161 mg/dL — ABNORMAL HIGH (ref <150)

## 2013-11-03 LAB — URINE MICROSCOPIC-ADD ON

## 2013-11-03 MED ORDER — SENNOSIDES-DOCUSATE SODIUM 8.6-50 MG PO TABS: 1.0000 | ORAL_TABLET | Freq: Every evening | ORAL | Status: DC | PRN

## 2013-11-03 MED ORDER — ENOXAPARIN SODIUM 40 MG/0.4ML ~~LOC~~ SOLN
40.0000 mg | SUBCUTANEOUS | Status: DC
  Administered 2013-11-03: 40 mg via SUBCUTANEOUS
  Filled 2013-11-03: qty 0.4

## 2013-11-03 MED ORDER — SODIUM CHLORIDE 0.9 % IV SOLN
INTRAVENOUS | Status: DC
  Administered 2013-11-03: 50 mL/h via INTRAVENOUS

## 2013-11-03 MED ORDER — ASPIRIN 81 MG PO CHEW
324.0000 mg | CHEWABLE_TABLET | Freq: Once | ORAL | Status: AC
Start: 2013-11-03 — End: 2013-11-03
  Administered 2013-11-03: 324 mg via ORAL
  Filled 2013-11-03: qty 4

## 2013-11-03 MED ORDER — ATORVASTATIN CALCIUM 10 MG PO TABS
10.0000 mg | ORAL_TABLET | Freq: Every day | ORAL | Status: DC
  Administered 2013-11-03: 10 mg via ORAL
  Filled 2013-11-03: qty 1

## 2013-11-03 MED ORDER — CLOPIDOGREL BISULFATE 75 MG PO TABS
75.0000 mg | ORAL_TABLET | Freq: Every day | ORAL | Status: DC
  Administered 2013-11-04: 75 mg via ORAL
  Filled 2013-11-03: qty 1

## 2013-11-03 MED ORDER — LORAZEPAM 1 MG PO TABS
1.0000 mg | ORAL_TABLET | Freq: Once | ORAL | Status: AC
  Administered 2013-11-03: 1 mg via ORAL
  Filled 2013-11-03: qty 1
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
## 2013-11-03 NOTE — Progress Notes (Signed)
Inquired with Dr. Roderic Palau if the patient could have a diet ordered.  According to the documented RN swallow screen that he passed.  Pt requests to eat.  MD stated it was okay as long as he passed his swallow screen, and not diabetic he could have a heart healthy diet.

## 2013-11-03 NOTE — ED Notes (Signed)
Patient has + Romberg test, imbalance standing on one leg.  Has significant word finding difficulty and receptive aphasia.  Speech is clear.  Slight hearing difficutly.  Slight L gaze palsy.  Diminished RAM.  No facial droop.  Strength 4/4 bilaterally throughout.

## 2013-11-03 NOTE — Progress Notes (Signed)
Patient still refusing the bed alarm.  Asked the patient to Call if he need to get OOB. Patient agreed

## 2013-11-03 NOTE — ED Notes (Signed)
Symptoms began yesterday morning, consisting of blurred vision, leaning to the right, unable to recite alphabet. Inability to make out words at times. Hx of past stroke.

## 2013-11-03 NOTE — Progress Notes (Signed)
Attempted to place the fall alarm on the patients bed due this fall risk score and because of the fact that the ED nurse told me in report that the patient was unsteady.  Pt refused to have the alarm placed stating that he is going to do everything for himself. I will continue to monitor him and pass this information on to the oncoming nurse.

## 2013-11-03 NOTE — ED Notes (Signed)
Report called to Charletta Cousin, RN on unit 300.  Will transfer patient when teleneurology is  Completed.

## 2013-11-03 NOTE — Progress Notes (Signed)
Patient very agitiaited, yelling and restless,   Asking where his home medications were at.  Tried to explain to the patient that the medication might have been sent home with family or locked up in the pharmacy.  Patient still agitated but is now lying down in the bed and has quit yelling.  I told the  I would find out where his medications  were and let him know.
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
## 2013-11-04 DIAGNOSIS — I6350 Cerebral infarction due to unspecified occlusion or stenosis of unspecified cerebral artery: Secondary | ICD-10-CM | POA: Diagnosis not present

## 2013-11-04 DIAGNOSIS — J449 Chronic obstructive pulmonary disease, unspecified: Secondary | ICD-10-CM | POA: Diagnosis not present

## 2013-11-04 DIAGNOSIS — E785 Hyperlipidemia, unspecified: Secondary | ICD-10-CM

## 2013-11-04 DIAGNOSIS — I10 Essential (primary) hypertension: Secondary | ICD-10-CM | POA: Diagnosis not present

## 2013-11-04 LAB — HEMOGLOBIN A1C
Hgb A1c MFr Bld: 6.1 % — ABNORMAL HIGH (ref <5.7)
Mean Plasma Glucose: 128 mg/dL — ABNORMAL HIGH (ref <117)

## 2013-11-04 LAB — TSH: TSH: 3.184 u[IU]/mL (ref 0.350–4.500)

## 2013-11-04 MED ORDER — SODIUM CHLORIDE 0.9 % IJ SOLN
3.0000 mL | Freq: Two times a day (BID) | INTRAMUSCULAR | Status: DC
  Administered 2013-11-04: 3 mL via INTRAVENOUS

## 2013-11-04 MED ORDER — CLOPIDOGREL BISULFATE 75 MG PO TABS: 75.0000 mg | ORAL_TABLET | Freq: Every day | ORAL | Status: AC

## 2013-11-04 MED ORDER — ATORVASTATIN CALCIUM 20 MG PO TABS: 20.0000 mg | ORAL_TABLET | Freq: Every day | ORAL | Status: DC

## 2013-11-04 MED ORDER — ASPIRIN 325 MG PO TBEC: 325.0000 mg | DELAYED_RELEASE_TABLET | Freq: Every day | ORAL | Status: DC
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
## 2013-11-04 NOTE — Progress Notes (Signed)
Approx 11:00 am the patient signed out AMA.  Dr. Sheran Fava notified me earlier that she had spoken to the patient about him not being ready to leave and that if he left it would have to be AMA.  According to her he states that he still wanted to leave.  Prior to the patient signing the [facility] document I advised him that he really should stay due to the fact he could really benefit from thearpies such PT,OT, and SLP.  I also reminded him about what Dr. Sheran Fava said about monitoring his heart and being a risk for arhythmias.  I voiced to him if he leaves against medial advice that he could possibly die.  He laughed at the comment and stated that he wanted to go home.  After receiving permission from him to discuss his care to his wife.  I voiced to her the importance of him following up with his primary PCP to have out patient therapy and 2D echo she verbalized understanding.  Pt left the floor ambulation with his wife.  He home medications was given to his wife to be take home.

## 2013-11-04 NOTE — Progress Notes (Signed)
Patient refusing IV fluids, stating that he doesn't want to be tied to that machine, he doesn't want to hear it beeping anymore, and he wants it off. Notified the on call MD.  Orders received and carried out.  Will continue to monitor.

## 2013-11-06 ENCOUNTER — Ambulatory Visit (HOSPITAL_COMMUNITY)
Admission: RE | Admit: 2013-11-06 | Discharge: 2013-11-06 | Disposition: A | Discharge status: Home / Self Care | Payer: Medicare Other | Source: Ambulatory Visit | Attending: Internal Medicine | Admitting: Internal Medicine

## 2013-11-06 ENCOUNTER — Other Ambulatory Visit (HOSPITAL_COMMUNITY): Payer: Self-pay | Admitting: Internal Medicine

## 2013-11-06 ENCOUNTER — Other Ambulatory Visit (HOSPITAL_COMMUNITY): Payer: Self-pay

## 2013-11-06 DIAGNOSIS — I6350 Cerebral infarction due to unspecified occlusion or stenosis of unspecified cerebral artery: Secondary | ICD-10-CM | POA: Diagnosis not present

## 2013-11-06 DIAGNOSIS — R51 Headache: Secondary | ICD-10-CM

## 2013-11-06 DIAGNOSIS — I639 Cerebral infarction, unspecified: Secondary | ICD-10-CM

## 2013-11-07 NOTE — Progress Notes (Signed)
UR chart review completed.  

## 2013-11-09 DIAGNOSIS — IMO0002 Reserved for concepts with insufficient information to code with codable children: Secondary | ICD-10-CM | POA: Diagnosis not present

## 2013-11-09 DIAGNOSIS — K219 Gastro-esophageal reflux disease without esophagitis: Secondary | ICD-10-CM | POA: Diagnosis not present

## 2013-11-09 DIAGNOSIS — I6350 Cerebral infarction due to unspecified occlusion or stenosis of unspecified cerebral artery: Secondary | ICD-10-CM | POA: Diagnosis not present

## 2013-12-11 ENCOUNTER — Ambulatory Visit (HOSPITAL_COMMUNITY)
Admission: RE | Admit: 2013-12-11 | Discharge: 2013-12-11 | Disposition: A | Discharge status: Home / Self Care | Payer: Medicare Other | Source: Ambulatory Visit | Attending: Internal Medicine | Admitting: Internal Medicine

## 2013-12-11 DIAGNOSIS — IMO0001 Reserved for inherently not codable concepts without codable children: Secondary | ICD-10-CM | POA: Diagnosis not present

## 2013-12-11 DIAGNOSIS — I10 Essential (primary) hypertension: Secondary | ICD-10-CM | POA: Insufficient documentation

## 2013-12-11 DIAGNOSIS — I69920 Aphasia following unspecified cerebrovascular disease: Secondary | ICD-10-CM

## 2013-12-11 DIAGNOSIS — J449 Chronic obstructive pulmonary disease, unspecified: Secondary | ICD-10-CM | POA: Diagnosis not present

## 2013-12-11 DIAGNOSIS — J4489 Other specified chronic obstructive pulmonary disease: Secondary | ICD-10-CM | POA: Insufficient documentation

## 2013-12-11 NOTE — Evaluation (Signed)
Physical Therapy Evaluation  Patient Details  Name: Glenn [name] MRN: [id number] Date of Birth: 16-Jan-1941  Today's Date: 12/11/2013 Time: 0935-1015 PT Time Calculation (min): 40 min Charges: 1 evaluatuon              Visit#: 1 of 1  Re-eval:   Assessment Diagnosis: Lt parital lobe CVA Surgical Date: 11/03/13 Next MD Visit: Dr. Gerarda Fraction   Authorization:      Authorization Time Period:    Authorization Visit#:   of     Past Medical History:  Past Medical History  Diagnosis Date  . HTN (hypertension)   . Memory changes   . Back pain   . GERD (gastroesophageal reflux disease)   . Stroke     2001 no residual symptom, 2013 no residual symptom  . COPD (chronic obstructive pulmonary disease)   . Tobacco use    Past Surgical History:  Past Surgical History  Procedure Laterality Date  . Back surgery      4 surgeries  . Carpal tunnel release Bilateral     2 surger    Subjective Symptoms/Limitations Symptoms: Pt is a 72 year old male referred to PT for general mobility s/p Lt CVA on 11/03/13.  He reports that his most difficulty is with expressive aphasia.  He reports that he is most limited by his overall activity tolerance, walking into objects on occasion.  he reports overall his mobility continues to improve dailly.   How long can you walk comfortably?: 1/4 mile Pain Assessment Currently in Pain?: No/denies  Balance Screening Balance Screen Has the patient fallen in the past 6 months: No Has the patient had a decrease in activity level because of a fear of falling? : No Is the patient reluctant to leave their home because of a fear of falling? : No  Cognition/Observation Observation/Other Assessments Observations: scattered visual tracking pattern with numbers on wall.    Sensation/Coordination/Flexibility/Functional Tests  FOTO: 76/24  Assessment RLE Assessment RLE Assessment: Within Functional Limits LLE Assessment LLE Assessment: Within Functional  Limits  Mobility/Balance  Static Standing Balance Single Leg Stance - Right Leg: 5 Single Leg Stance - Left Leg: 5 Tandem Stance - Right Leg: 8 Tandem Stance - Left Leg: 8 Rhomberg - Eyes Opened: 60 Rhomberg - Eyes Closed: 30 Berg Balance Test Sit to Stand: Able to stand without using hands and stabilize independently Standing Unsupported: Able to stand safely 2 minutes Sitting with Back Unsupported but Feet Supported on Floor or Stool: Able to sit safely and securely 2 minutes Stand to Sit: Sits safely with minimal use of hands Transfers: Able to transfer safely, minor use of hands Standing Unsupported with Eyes Closed: Able to stand 10 seconds safely Standing Ubsupported with Feet Together: Able to place feet together independently and stand 1 minute safely From Standing, Reach Forward with Outstretched Arm: Can reach forward >12 cm safely (5") From Standing Position, Pick up Object from Floor: Able to pick up shoe safely and easily From Standing Position, Turn to Look Behind Over each Shoulder: Looks behind from both sides and weight shifts well Turn 360 Degrees: Able to turn 360 degrees safely in 4 seconds or less Standing Unsupported, Alternately Place Feet on Step/Stool: Able to stand independently and complete 8 steps >20 seconds Standing Unsupported, One Foot in Front: Able to take small step independently and hold 30 seconds Standing on One Leg: Able to lift leg independently and hold equal to or more than 3 seconds Total Score: 50  Physical Therapy Assessment and Plan PT Assessment and Plan Clinical Impression Statement: [name] presented today for intial evaluation for PT.  At this time pt has most difficulty with impairments which will be addressed by SLP services.  At this time highly recommend OT services for PT to address visual scanning, spatial and proprioceptive recognition. Will d/c pt at this time and provided an HEP for balance activities at home.     Goals    None  Problem List Patient Active Problem List   Diagnosis Date Noted  . Aphasia following cerebrovascular disease 12/11/2013  . Acute ischemic stroke 11/03/2013  . Acute renal failure 11/03/2013  . COPD (chronic obstructive pulmonary disease) 08/06/2012  . Tobacco abuse 08/06/2012  . Memory difficulties 08/06/2012  . Hyperlipidemia 08/06/2012  . CVA (cerebral infarction) 08/05/2012  . HTN (hypertension) 08/05/2012  . CONSTIPATION 05/14/2009  . CHANGE IN BOWELS 05/14/2009  . COLONIC POLYPS, HX OF 05/14/2009  . SHOULDER, ARTHRITIS, DEGEN./OSTEO 03/14/2008  . SHOULDER PAIN 03/14/2008  . IMPINGEMENT SYNDROME 03/14/2008  . RUPTURE ROTATOR CUFF 03/14/2008    PT - End of Session Activity Tolerance: Patient tolerated treatment well General Behavior During Therapy: WFL for tasks assessed/performed  GP Functional Assessment Tool Used: Medicare Functional Limitation: Mobility: Walking and moving around Mobility: Walking and Moving Around Current Status [id number]): At least 20 percent but less than 40 percent impaired, limited or restricted Mobility: Walking and Moving Around Goal Status [phone]): At least 20 percent but less than 40 percent impaired, limited or restricted Mobility: Walking and Moving Around Discharge Status [phone]): At least 20 percent but less than 40 percent impaired, limited or restricted  Aeon Kessner, MPT, ATC 12/11/2013, 2:24 PM  Physician Documentation Your signature is required to indicate approval of the treatment plan as stated above.  Please sign and either send electronically or make a copy of this report for your files and return this physician signed original.   Please mark one 1.__approve of plan  2. ___approve of plan with the following conditions.   ______________________________                                                          _____________________ Physician Signature                                                                                                              Date

## 2013-12-11 NOTE — Evaluation (Signed)
Speech Language Pathology Evaluation Patient Details  Name: Glenn [name] MRN: [id number] Date of Birth: November 24, 1941  Today's Date: 12/11/2013 Time: [id number] SLP Time Calculation (min): 65 min  Authorization: Medicare  Authorization Time Period: 12/11/2013-01/08/2013  Authorization Visit#: Authorization - Visit Number: 1 of 8   Past Medical History:  Past Medical History  Diagnosis Date  . HTN (hypertension)   . Memory changes   . Back pain   . GERD (gastroesophageal reflux disease)   . Stroke     2001 no residual symptom, 2013 no residual symptom  . COPD (chronic obstructive pulmonary disease)   . Tobacco use    Past Surgical History:  Past Surgical History  Procedure Laterality Date  . Back surgery      4 surgeries  . Carpal tunnel release Bilateral     2 surger   HPI:  HPI: Pt is admitted with a left parietal lobe stroke manifested primarily with expressive aphasia and high level balance dysfunction.  He has has 2 prior strokes with full resolution.  He lives with his wife and had been fully independent at home PTA. 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 : No Is the patient reluctant to leave their home because of a fear of falling? : No  Cognition  Arousal/Alertness: Awake/alert Memory: Appears intact Awareness: Appears intact Problem Solving: Appears intact Executive Function: Self  Monitoring;Sequencing;Organizing Sequencing: Impaired Sequencing Impairment: Verbal complex;Functional complex Organizing: Impaired Organizing Impairment: Verbal complex;Functional complex Self Monitoring: Impaired Self Monitoring Impairment: Verbal complex Behaviors: Impulsive Safety/Judgment: Appears intact  Comprehension  Auditory Comprehension Overall Auditory Comprehension: Impaired Yes/No Questions: Within Functional Limits Commands: Impaired Complex Commands: 75-100% accurate Conversation: Complex Interfering Components: Processing speed EffectiveTechniques: Extra processing time;Repetition;Pausing;Stressing words Visual Recognition/Discrimination Discrimination: Within Function Limits Reading Comprehension Reading Status: Impaired Word level: Within functional limits Sentence Level: Impaired Paragraph Level: Impaired Functional Environmental (signs, name badge): Within functional limits Interfering Components: Visual scanning;Right neglect/inattention;Processing time Effective Techniques: Eye glasses;Verbal cueing;Visual cueing  Expression  Expression Primary Mode of Expression: Verbal Verbal Expression Overall Verbal Expression: Impaired Initiation: No impairment Level of Generative/Spontaneous Verbalization: Conversation Repetition: Impaired Level of Impairment: Sentence level Naming: Impairment Responsive: 76-100% accurate Confrontation: Impaired Convergent: 75-100% accurate Divergent: 25-49% accurate Verbal Errors: Phonemic paraphasias;Aware of errors Pragmatics: No impairment Effective Techniques: Open ended questions;Phonemic cues;Written cues;Articulatory cues Non-Verbal Means of Communication: Not applicable Written Expression Dominant Hand: Right Written Expression: Exceptions to [facility] Self Formulation Ability: Word  Oral/Motor  Oral Motor/Sensory Function Overall Oral Motor/Sensory Function: Appears within functional limits for tasks  assessed Motor Speech Overall Motor Speech: Appears within functional limits for tasks assessed Respiration: Within functional limits Phonation: Normal Resonance: Within functional limits Articulation: Within functional limitis Intelligibility: Intelligible Motor Planning: Witnin functional limits  SLP Goals  Home Exercise SLP Goal: Patient will Perform Home Exercise Program: with supervision, verbal cues required/provided SLP Short Term Goals SLP Short Term Goal 1: Pt will increase divergent naming to 10+ items for concrete categories with min  cues. SLP Short Term Goal 2: Pt will increase divergent naming to 6+ items for abstract categories with mild/mod cues. SLP Short Term Goal 3: Pt will increase reading comprehension to 90% acc for short paragraph-length material with multiple choice responses. SLP Short Term Goal 4: Pt will increase written expression for personal/bio info to 100% acc with mi/mod cues provided. SLP Short Term Goal 5: Pt will self correct paraphasic speech errors in conversational speech 90% of the time with min cues and use of fluency enhancing strategies. Additional SLP Short Term Goals?: Yes SLP Short Term Goal 6: Pt will complete basic level calculations (add, subtract, multiply, divide) with 90% acc and mod assist. SLP Long Term Goals SLP Long Term Goal 1: Pt will increase verbal expression to [facility] for moderately complex conversations in home and community environment with use of strategies. SLP Long Term Goal 2: Pt will increase reading comprehension to [facility] for paragraph-length material  with use of strategies. SLP Long Term Goal 3: Pt will increase written expression to [facility] for personal/bio and short sentences with use of strategies.  Assessment/Plan  Patient Active Problem List   Diagnosis Date Noted  . Aphasia following cerebrovascular disease 12/11/2013  . Acute ischemic stroke 11/03/2013  . Acute renal failure 11/03/2013  . COPD (chronic obstructive  pulmonary disease) 08/06/2012  . Tobacco abuse 08/06/2012  . Memory difficulties 08/06/2012  . Hyperlipidemia 08/06/2012  . CVA (cerebral infarction) 08/05/2012  . HTN (hypertension) 08/05/2012  . CONSTIPATION 05/14/2009  . CHANGE IN BOWELS 05/14/2009  . COLONIC POLYPS, HX OF 05/14/2009  . SHOULDER, ARTHRITIS, DEGEN./OSTEO 03/14/2008  . SHOULDER PAIN 03/14/2008  . IMPINGEMENT SYNDROME 03/14/2008  . RUPTURE ROTATOR CUFF 03/14/2008   SLP - End of Session Activity Tolerance: Patient tolerated treatment well General Behavior During Therapy: WFL for tasks assessed/performed  SLP Assessment/Plan Clinical Impression Statement: Mr. Angotti presents with mild/mod expressive aphasia characterized by verbal/phonemic paraphasic errors, difficulty with word retrieval and sentence-level speech production. Reading comprehension and written expression are moderately impaired. Auditory comprehension is only mildly impaired for complext commands. Pt would benefit from skilled speech/language therapy in outpatient setting to maximize functional recovery in communication and increase independence with communication. Wife is very supportive and pt appears motivated. Speech Therapy Frequency: min 2x/week Duration: 4 weeks Treatment/Interventions: Language facilitation;Cueing hierarchy;Patient/family education;Compensatory strategies;SLP instruction and feedback Potential to Achieve Goals: Good  GN Functional Limitations: Spoken language expressive Spoken Language Expression Current Status [phone]): At least 20 percent but less than 40 percent impaired, limited or restricted Spoken Language Expression Goal Status [phone]): At least 1 percent but less than 20 percent impaired, limited or restricted  Thank you,  Genene Churn, [place] 12/11/2013, 1:14 PM  Physician Documentation Your signature is required to indicate approval of the treatment plan as stated above.  Please sign  and either send electronically or make a copy of this report for your files and return this physician signed original.  Please mark one 1.__approve of plan  2. ___approve of plan with the following conditions.   ______________________________                                                          _____________________ Physician Signature  Date  

## 2013-12-19 ENCOUNTER — Ambulatory Visit (HOSPITAL_COMMUNITY)
Admission: RE | Admit: 2013-12-19 | Discharge: 2013-12-19 | Disposition: A | Discharge status: Home / Self Care | Payer: Medicare Other | Source: Ambulatory Visit | Attending: Internal Medicine | Admitting: Internal Medicine

## 2013-12-19 DIAGNOSIS — H539 Unspecified visual disturbance: Secondary | ICD-10-CM | POA: Insufficient documentation

## 2013-12-19 DIAGNOSIS — I639 Cerebral infarction, unspecified: Secondary | ICD-10-CM

## 2013-12-19 DIAGNOSIS — M6281 Muscle weakness (generalized): Secondary | ICD-10-CM

## 2013-12-19 DIAGNOSIS — R279 Unspecified lack of coordination: Secondary | ICD-10-CM | POA: Insufficient documentation
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
## 2013-12-26 ENCOUNTER — Ambulatory Visit (HOSPITAL_COMMUNITY)
Admission: RE | Admit: 2013-12-26 | Discharge: 2013-12-26 | Disposition: A | Discharge status: Home / Self Care | Payer: Medicare Other | Source: Ambulatory Visit | Attending: Internal Medicine | Admitting: Internal Medicine

## 2013-12-26 DIAGNOSIS — I69920 Aphasia following unspecified cerebrovascular disease: Secondary | ICD-10-CM

## 2013-12-26 NOTE — Progress Notes (Signed)
Speech Language Pathology Treatment Patient Details  Name: Glenn [name] MRN: [id number] Date of Birth: 01-11-1941  Today's Date: 12/26/2013 Time: 1110-1200 SLP Time Calculation (min): 50 min  Authorization: Medicare  Authorization Time Period: 12/11/2013-01/08/2013  Authorization Visit#:   of 8   HPI:    Pain Assessment Currently in Pain?: No/denies   Treatment  Expressive/Receptive Language Therapy Aphasia Therapy Reading Comprehension Therapy  SLP Goals  Home Exercise SLP Goal: Patient will Perform Home Exercise Program: with supervision, verbal cues required/provided SLP Short Term Goals SLP Short Term Goal 1: Pt will increase divergent naming to 10+ items for concrete categories with min cues. SLP Short Term Goal 1 - Progress: Progressing toward goal SLP Short Term Goal 2: Pt will increase divergent naming to 6+ items for abstract categories with mild/mod cues. SLP Short Term Goal 2 - Progress: Progressing toward goal SLP Short Term Goal 3: Pt will increase reading comprehension to 90% acc for short paragraph-length material with multiple choice responses. SLP Short Term Goal 3 - Progress: Progressing toward goal SLP Short Term Goal 4: Pt will increase written expression for personal/bio info to 100% acc with mi/mod cues provided. SLP Short Term Goal 4 - Progress: Progressing toward goal SLP Short Term Goal 5: Pt will self correct paraphasic speech errors in conversational speech 90% of the time with min cues and use of fluency enhancing strategies. SLP Short Term Goal 5 - Progress: Progressing toward goal Additional SLP Short Term Goals?: Yes SLP Short Term Goal 6: Pt will complete basic level calculations (add, subtract, multiply, divide) with 90% acc and mod assist. SLP Short Term Goal 6 - Progress: Progressing toward goal SLP Long Term Goals SLP Long Term Goal 1: Pt will increase verbal expression to [facility] for moderately complex conversations in home and community  environment with use of strategies. SLP Long Term Goal 1 - Progress: Progressing toward goal SLP Long Term Goal 2: Pt will increase reading comprehension to [facility] for paragraph-length material  with use of strategies. SLP Long Term Goal 2 - Progress: Progressing toward goal SLP Long Term Goal 3: Pt will increase written expression to [facility] for personal/bio and short sentences with use of strategies. SLP Long Term Goal 3 - Progress: Progressing toward goal  Assessment/Plan  Patient Active Problem List   Diagnosis Date Noted  . Impairment of visual perception 12/19/2013  . Lack of coordination 12/19/2013  . Muscle weakness (generalized) 12/19/2013  . Aphasia following cerebrovascular disease 12/11/2013  . Acute ischemic stroke 11/03/2013  . Acute renal failure 11/03/2013  . COPD (chronic obstructive pulmonary disease) 08/06/2012  . Tobacco abuse 08/06/2012  . Memory difficulties 08/06/2012  . Hyperlipidemia 08/06/2012  . CVA (cerebral infarction) 08/05/2012  . HTN (hypertension) 08/05/2012  . CONSTIPATION 05/14/2009  . CHANGE IN BOWELS 05/14/2009  . COLONIC POLYPS, HX OF 05/14/2009  . SHOULDER, ARTHRITIS, DEGEN./OSTEO 03/14/2008  . SHOULDER PAIN 03/14/2008  . IMPINGEMENT SYNDROME 03/14/2008  . RUPTURE ROTATOR CUFF 03/14/2008   SLP - End of Session Activity Tolerance: Patient tolerated treatment well General Behavior During Therapy: WFL for tasks assessed/performed  SLP Assessment/Plan Clinical Impression Statement: Mr. Chavana was seen for tx today with spouse present. SLP targeted divergent naming of items within a category where pt named an average of 10 items in 2 minutes. Pt benefited from some initial phonemic cueing and visualization strategies. Pt attempted written task where he was asked to write personal hx information on lined paper. He became very frustrated and did not complete task. Pt  was encouraged by cues to take a break and restart activity. Reading comprehension was  difficult today as pt read aloud with paraphasias noted throughout small paragraphs. When asked simple questions about read material, pt achieved 50% accuracy. SLP encouraged pt to take breaks at home, self-monitor for paraphasias, and practice reading materials aloud. Pt and spouse receptive. Cont with POC.  Speech Therapy Frequency: min 2x/week Duration: 4 weeks Treatment/Interventions: Language facilitation;Cueing hierarchy;Patient/family education;Compensatory strategies;SLP instruction and feedback Potential to Achieve Goals: Good  GN    [place] Dost S 12/26/2013, 12:05 PM

## 2013-12-26 NOTE — Progress Notes (Signed)
Occupational Therapy Treatment Patient Details  Name: Glenn [name] MRN: [id number] Date of Birth: 02/03/1941  Today's Date: 12/26/2013 Time: 1020-1110 OT Time Calculation (min): 50 min Cognition: [id number] ([id number]  Visit#: 2 of 16  Re-eval: 01/16/14   Authorization: Medicare  Authorization Time Period: before 10th visit  Authorization Visit#: 2 of 10  Subjective Symptoms/Limitations Symptoms: "I was doing some paperwork yesterday, and after I started getting tired my lines started going downhill. I couldn't get them... straight." Pain Assessment Currently in Pain?: No/denies  Exercise/Treatments Cognitive Exercises Visual Motor Integration: Pt completed VMI Test of VisualA C Perception (Full format and visual perception subtest). Prior to beginning, pt adjusted paper with right tilt - Per wife, baseline behavior. Per pt report, he was able to determine what actions he needed to take to create shapes, but was unable to complete them how he wished. Pt wasw easily distracted during task (per wife, not baseline behavior) and engaged in self-talk while completing all tasks. Pt able to verbally relate 'necktie' and '[employer]' in relation to diagrams 22 and 24 on visual coordinatino subest. Pt with increasing distractibility with fatigue.       Occupational Therapy Assessment and Plan OT Assessment and Plan Clinical Impression Statement: A: Pt required significant time to complete VMI (full format and visual perception subtest) - 50 minutes total. Pt distracted throughout completion of tests, increasing in distractibiliy with increased fatigue at end of session.  Patient with noted frustration at inabillity to complete drawings as he was aware they needed to be .  Patient noted to 'miss' a couple of squares on test  and unable to detect oversight.   OT Plan: P: Complete VMI motor coordination subtest and score. Assess and address pt's deficits according to [facility] results.   Goals Short  Term Goals Short Term Goal 1: Patient will be educated on a HEP. Short Term Goal 1 Progress: Progressing toward goal Short Term Goal 2: Patient will improve right grip strength by 10 pounds and pinch strength by 2 pounds for increased ability to open containers. Short Term Goal 2 Progress: Progressing toward goal Short Term Goal 3: Patient will improve [place] by oppoising right thumb to all digits in less than 3". Short Term Goal 3 Progress: Progressing toward goal Short Term Goal 4: Patient will attend to his right side in community ambulation with 75% accuracy for increased safety. Short Term Goal 4 Progress: Progressing toward goal Short Term Goal 5: Patient will improve ability to express self in written manner from fair to good. Short Term Goal 5 Progress: Progressing toward goal Additional Short Term Goals?: Yes Short Term Goal 6: Patient will improve visual motor integration skills from fair to good. Short Term Goal 6 Progress: Progressing toward goal Short Term Goal 7: Patient will improve visual perceptual skills from fair to good. Short Term Goal 7 Progress: Progressing toward goal Long Term Goals Long Term Goal 1: Patient will return to prior level of function with all daily activities.  Long Term Goal 1 Progress: Progressing toward goal Long Term Goal 2: Patient will improve right grip strength by 20 pounds and pinch strength by 8 pounds for increased ability to open containers. Long Term Goal 2 Progress: Progressing toward goal Long Term Goal 3: Patient will improve [place] in right hand by completing [place] Peg Test in less than 27". Long Term Goal 3 Progress: Progressing toward goal Long Term Goal 4: Patient will attend to his right side in community ambulation with 90% accuracy for  increased safety. Long Term Goal 4 Progress: Progressing toward goal Long Term Goal 5: Patient will improve ability to express self in written manner from good to good +. Long Term Goal 5 Progress:  Progressing toward goal Additional Long Term Goals?: Yes Long Term Goal 6: Patient will improve visual motor integration skills from good to good +. Long Term Goal 6 Progress: Progressing toward goal Long Term Goal 7: Patient will improve visual perceptual skills from good to good +. Long Term Goal 7 Progress: Progressing toward goal  Problem List Patient Active Problem List   Diagnosis Date Noted  . Impairment of visual perception 12/19/2013  . Lack of coordination 12/19/2013  . Muscle weakness (generalized) 12/19/2013  . Aphasia following cerebrovascular disease 12/11/2013  . Acute ischemic stroke 11/03/2013  . Acute renal failure 11/03/2013  . COPD (chronic obstructive pulmonary disease) 08/06/2012  . Tobacco abuse 08/06/2012  . Memory difficulties 08/06/2012  . Hyperlipidemia 08/06/2012  . CVA (cerebral infarction) 08/05/2012  . HTN (hypertension) 08/05/2012  . CONSTIPATION 05/14/2009  . CHANGE IN BOWELS 05/14/2009  . COLONIC POLYPS, HX OF 05/14/2009  . SHOULDER, ARTHRITIS, DEGEN./OSTEO 03/14/2008  . SHOULDER PAIN 03/14/2008  . IMPINGEMENT SYNDROME 03/14/2008  . RUPTURE ROTATOR CUFF 03/14/2008    End of Session Activity Tolerance: Patient tolerated treatment well General Behavior During Therapy: [facility] for tasks assessed/performed  GO    Donney Rankins, OTR/L  12/26/2013, 12:01 PM

## 2013-12-29 ENCOUNTER — Ambulatory Visit (HOSPITAL_COMMUNITY)
Admission: RE | Admit: 2013-12-29 | Discharge: 2013-12-29 | Disposition: A | Discharge status: Home / Self Care | Payer: Medicare Other | Source: Ambulatory Visit | Attending: Internal Medicine | Admitting: Internal Medicine

## 2013-12-29 DIAGNOSIS — J449 Chronic obstructive pulmonary disease, unspecified: Secondary | ICD-10-CM | POA: Insufficient documentation

## 2013-12-29 DIAGNOSIS — I10 Essential (primary) hypertension: Secondary | ICD-10-CM | POA: Diagnosis not present

## 2013-12-29 DIAGNOSIS — IMO0001 Reserved for inherently not codable concepts without codable children: Secondary | ICD-10-CM | POA: Diagnosis not present

## 2013-12-29 DIAGNOSIS — I69920 Aphasia following unspecified cerebrovascular disease: Secondary | ICD-10-CM | POA: Diagnosis not present

## 2013-12-29 DIAGNOSIS — J4489 Other specified chronic obstructive pulmonary disease: Secondary | ICD-10-CM | POA: Diagnosis not present

## 2013-12-29 NOTE — Progress Notes (Signed)
Speech Language Pathology Treatment Patient Details  Name: Glenn [name] MRN: [id number] Date of Birth: 02-28-1941  Today's Date: 12/29/2013 Time: 1030-1115 SLP Time Calculation (min): 45 min  Authorization: Medicare  Authorization Time Period: 12/11/2013-01/08/2013  Authorization Visit#:  3 of 8    Treatment  Receptive/Expressive Language Therapy  SLP Goals  Home Exercise SLP Goal: Patient will Perform Home Exercise Program: with supervision, verbal cues required/provided SLP Goal: Perform Home Exercise Program - Progress: Progressing toward goal SLP Short Term Goals SLP Short Term Goal 1: Pt will increase divergent naming to 10+ items for concrete categories with min cues. SLP Short Term Goal 1 - Progress: Progressing toward goal SLP Short Term Goal 2: Pt will increase divergent naming to 6+ items for abstract categories with mild/mod cues. SLP Short Term Goal 2 - Progress: Progressing toward goal SLP Short Term Goal 3: Pt will increase reading comprehension to 90% acc for short paragraph-length material with multiple choice responses. SLP Short Term Goal 3 - Progress: Progressing toward goal SLP Short Term Goal 4: Pt will increase written expression for personal/bio info to 100% acc with mi/mod cues provided. SLP Short Term Goal 4 - Progress: Progressing toward goal SLP Short Term Goal 5: Pt will self correct paraphasic speech errors in conversational speech 90% of the time with min cues and use of fluency enhancing strategies. SLP Short Term Goal 5 - Progress: Progressing toward goal Additional SLP Short Term Goals?: Yes SLP Short Term Goal 6: Pt will complete basic level calculations (add, subtract, multiply, divide) with 90% acc and mod assist. SLP Short Term Goal 6 - Progress: Progressing toward goal SLP Long Term Goals SLP Long Term Goal 1: Pt will increase verbal expression to [facility] for moderately complex conversations in home and community environment with use of  strategies. SLP Long Term Goal 1 - Progress: Progressing toward goal SLP Long Term Goal 2: Pt will increase reading comprehension to [facility] for paragraph-length material  with use of strategies. SLP Long Term Goal 2 - Progress: Progressing toward goal SLP Long Term Goal 3: Pt will increase written expression to [facility] for personal/bio and short sentences with use of strategies. SLP Long Term Goal 3 - Progress: Progressing toward goal  Assessment/Plan  Patient Active Problem List   Diagnosis Date Noted  . Impairment of visual perception 12/19/2013  . Lack of coordination 12/19/2013  . Muscle weakness (generalized) 12/19/2013  . Aphasia following cerebrovascular disease 12/11/2013  . Acute ischemic stroke 11/03/2013  . Acute renal failure 11/03/2013  . COPD (chronic obstructive pulmonary disease) 08/06/2012  . Tobacco abuse 08/06/2012  . Memory difficulties 08/06/2012  . Hyperlipidemia 08/06/2012  . CVA (cerebral infarction) 08/05/2012  . HTN (hypertension) 08/05/2012  . CONSTIPATION 05/14/2009  . CHANGE IN BOWELS 05/14/2009  . COLONIC POLYPS, HX OF 05/14/2009  . SHOULDER, ARTHRITIS, DEGEN./OSTEO 03/14/2008  . SHOULDER PAIN 03/14/2008  . IMPINGEMENT SYNDROME 03/14/2008  . RUPTURE ROTATOR CUFF 03/14/2008   SLP - End of Session Activity Tolerance: Patient tolerated treatment well General Behavior During Therapy: WFL for tasks assessed/performed  SLP Assessment/Plan Clinical Impression Statement: Glenn [name] was seen for tx today with spouse present. SLP once again targeted divergent naming of items within a category where pt named an average of 10 items in 2 minutes. Pt benefited from some initial phonemic cueing and visualization strategies. Pt cont to demonstrate significant frustration during word finding tasks. Given items in a category, pt named categories in 90% of opportunities. Provided lists of 3 words, pt imitated  with 60% accuracy as he often demonstrated paraphasias. Pt cont to  display increased speech errors as frustration increases. SLP encouraged pt to take a break and be patient with himself. Homework task provided related to naming items in a category. Cont with POC.  Speech Therapy Frequency: min 2x/week Duration: 4 weeks Treatment/Interventions: Language facilitation;Cueing hierarchy;Patient/family education;Compensatory strategies;SLP instruction and feedback Potential to Achieve Goals: Good  GN    Glenn [name] S 12/29/2013, 12:19 PM

## 2014-01-01 ENCOUNTER — Ambulatory Visit (HOSPITAL_COMMUNITY): Payer: Self-pay | Admitting: Speech Pathology

## 2014-01-01 ENCOUNTER — Telehealth (HOSPITAL_COMMUNITY): Payer: Self-pay

## 2014-01-01 ENCOUNTER — Ambulatory Visit (HOSPITAL_COMMUNITY): Payer: Self-pay | Admitting: Specialist

## 2014-01-04 ENCOUNTER — Ambulatory Visit (HOSPITAL_COMMUNITY): Payer: Self-pay | Admitting: Speech Pathology

## 2014-01-04 ENCOUNTER — Ambulatory Visit (HOSPITAL_COMMUNITY): Payer: Self-pay

## 2014-01-08 ENCOUNTER — Ambulatory Visit (HOSPITAL_COMMUNITY)
Admission: RE | Admit: 2014-01-08 | Discharge: 2014-01-08 | Disposition: A | Discharge status: Home / Self Care | Payer: Medicare Other | Source: Ambulatory Visit | Attending: Internal Medicine | Admitting: Internal Medicine

## 2014-01-08 ENCOUNTER — Ambulatory Visit (HOSPITAL_COMMUNITY): Payer: Self-pay | Admitting: Occupational Therapy

## 2014-01-08 DIAGNOSIS — J449 Chronic obstructive pulmonary disease, unspecified: Secondary | ICD-10-CM | POA: Diagnosis not present

## 2014-01-08 DIAGNOSIS — I10 Essential (primary) hypertension: Secondary | ICD-10-CM | POA: Diagnosis not present

## 2014-01-08 DIAGNOSIS — I69920 Aphasia following unspecified cerebrovascular disease: Secondary | ICD-10-CM | POA: Diagnosis not present

## 2014-01-08 DIAGNOSIS — IMO0001 Reserved for inherently not codable concepts without codable children: Secondary | ICD-10-CM | POA: Diagnosis not present

## 2014-01-08 NOTE — Progress Notes (Signed)
Speech Language Pathology Treatment Patient Details  Name: Reon Rutten MRN: [id number] Date of Birth: 03/15/1941  Today's Date: 01/08/2014 Time: 1030-1115 SLP Time Calculation (min): 45 min  Authorization: Medicare  Authorization Time Period: 12/11/2013-01/08/2013  Authorization Visit#:  4 of 8   HPI:  Symptoms/Limitations Symptoms: "I think I'm doing better." Pain Assessment Currently in Pain?: No/denies  Assessments Prior Functional Status Cognitive/Linguistic Baseline: Within functional limits Type of Home: House  Lives With: Spouse;Family Education: Graduated high school and took some classes in the [employer] 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 SLP Short Term Goal 1 - Progress: Progressing toward goal SLP Short Term Goal 2: Pt will increase divergent naming to 6+ items for abstract categories with mild/mod cues. SLP Short Term Goal 2 - Progress: Progressing toward goal SLP Short Term Goal 3: Pt will increase reading comprehension to 90% acc for short paragraph-length material with multiple choice responses. SLP Short Term Goal 3 - Progress: Progressing toward goal SLP Short Term Goal 4: Pt will increase written expression for personal/bio info to 100% acc with mi/mod cues provided. SLP Short Term Goal 4 - Progress: Progressing toward goal SLP Short Term Goal 5: Pt will self correct paraphasic speech errors in conversational speech 90% of the time with min cues and use of fluency enhancing strategies. SLP Short Term Goal 5 - Progress: Progressing toward goal Additional SLP  Short Term Goals?: Yes SLP Short Term Goal 6: Pt will complete basic level calculations (add, subtract, multiply, divide) with 90% acc and mod assist. SLP Short Term Goal 6 - Progress: Progressing toward goal SLP Long Term Goals SLP Long Term Goal 1: Pt will increase verbal expression to [facility] for moderately complex conversations in home and community environment with use of strategies. SLP Long Term Goal 1 - Progress: Progressing toward goal SLP Long Term Goal 2: Pt will increase reading comprehension to [facility] for paragraph-length material  with use of strategies. SLP Long Term Goal 2 - Progress: Progressing toward goal SLP Long Term Goal 3: Pt will increase written expression to [facility] for personal/bio and short sentences with use of strategies. SLP Long Term Goal 3 - Progress: Progressing toward goal  Assessment/Plan  Patient Active Problem List   Diagnosis Date Noted  . Impairment of visual perception 12/19/2013  . Lack of coordination 12/19/2013  . Muscle weakness (generalized) 12/19/2013  . Aphasia following cerebrovascular disease 12/11/2013  . Acute ischemic stroke 11/03/2013  . Acute renal failure 11/03/2013  . COPD (chronic obstructive pulmonary disease) 08/06/2012  . Tobacco abuse 08/06/2012  . Memory difficulties 08/06/2012  . Hyperlipidemia 08/06/2012  . CVA (cerebral infarction) 08/05/2012  . HTN (hypertension) 08/05/2012  . CONSTIPATION 05/14/2009  . CHANGE IN BOWELS 05/14/2009  . COLONIC POLYPS, HX OF 05/14/2009  . SHOULDER, ARTHRITIS, DEGEN./OSTEO 03/14/2008  . SHOULDER PAIN 03/14/2008  . IMPINGEMENT SYNDROME 03/14/2008  . RUPTURE ROTATOR CUFF 03/14/2008   SLP - End of Session Activity Tolerance: Patient tolerated treatment well General Behavior During Therapy: WFL for tasks assessed/performed  SLP Assessment/Plan  Clinical Impression Statement: Mr. Bullers was seen for tx without spouse present. Homework given after initial evaluation was incomplete, however  he did  complete homework assigned during previous session. Pt identified appropriate items to category (written) with 90%+ accuracy with min cues. Pt required mod assist to generate items in abstract naming tasks (feelings) and benefitted from cues that provided specific [place] to identify possible feelings evoked. Pt again expressed frustration about needing to slow down when expressing what he "knows what (he) wants to say" and over not being able to generate responses to word-finding/naming tasks. Pt cued to utilize word-finding strategies and demonstrated success with this when provided mild/mod cues. SLP spoke with spouse after tx. She describes the pt as quite "negative" in his outlook and states that he has minimal interaction with his children.  Speech Therapy Frequency: min 2x/week Duration: 4 weeks Treatment/Interventions: Language facilitation;Cueing hierarchy;Patient/family education;Compensatory strategies;SLP instruction and feedback Potential to Achieve Goals: Good Potential Considerations: Cooperation/participation level  Thank you,  Genene Churn, [place]     [place] 01/08/2014, 11:23 AM

## 2014-01-11 NOTE — Progress Notes (Signed)
Occupational Therapy Treatment Patient Details  Name: Glenn [name] MRN: [id number] Date of Birth: September 12, 1941  Today's Date: 01/08/2014 Time:  1022-1113 OT Time Calculation (min): 51 min Cognitive 1022-1053 (31') TherActivities [id number] (20')    Visit#: 3 of 16  Re-eval: 01/16/14    Authorization: Medicare  Authorization Time Period: before 10th visit  Authorization Visit#: 3 of 10  Subjective Symptoms/Limitations Symptoms: S:  I have noticed that things are moving more this way (patient gestures to right peripherial ara) when I am trying to do things  Pain Assessment Currently in Pain?: No/denies     Exercise/Treatments Cognitive Exercises  Final section of VMI completed this date. Visual Motor portion this date with significantly increased time required to focus, complete task.  Connecting dots portion of test caused increased frustration for patient this date.  He is noted to perseverate on dots.  Visually, patient noted to identify all areas of paper, track all quads this date and demo ability to place pencil on dot with no difficulty.  Patient is noted to write name this date with paper turned almost completely in vertical plane with noted letters off line at end of name.  Patient also with increased difficulty this date problem solving to write the date and his birthdate as year, month and day.  Able to immediately write year, needed mod cueing for month and max cues for day, however still unable to get correctly.   Brought in speech therapist to assess problem solving and cognitive difficulties during this session also.  Educated patient and wife on compensatory tech to be able to use at home and in daily tasks.  Handouts given for at home visual practice activities, instructed to bring at next session what he was able to complete.        Occupational Therapy Assessment and Plan OT Assessment and Plan Clinical Impression Statement: A:  Patient completed final portion of VMI  test this date however unable to complete in time frame elotted by test.  Patient noted to perseverate on 'dots' during tracing task; increased difficulty staying within lines and noted difficulty in far left quad to write/trace/follow on decline.  Patient with increased frustration during testing activities.   VMI Total score 44 - Motor coordination score 8/27; Visual perception 21/27; Visual-Motor Integration 15/24 OT Plan: P:  compensatory placement techniques (pipecleanr lines)   Goals Short Term Goals Short Term Goal 1: Patient will be educated on a HEP. Short Term Goal 1 Progress: Progressing toward goal Short Term Goal 2: Patient will improve right grip strength by 10 pounds and pinch strength by 2 pounds for increased ability to open containers. Short Term Goal 2 Progress: Progressing toward goal Short Term Goal 3: Patient will improve [place] by oppoising right thumb to all digits in less than 3". Short Term Goal 3 Progress: Progressing toward goal Short Term Goal 4: Patient will attend to his right side in community ambulation with 75% accuracy for increased safety. Short Term Goal 4 Progress: Progressing toward goal Short Term Goal 5: Patient will improve ability to express self in written manner from fair to good. Short Term Goal 5 Progress: Progressing toward goal Short Term Goal 6: Patient will improve visual motor integration skills from fair to good. Short Term Goal 6 Progress: Progressing toward goal Short Term Goal 7: Patient will improve visual perceptual skills from fair to good. Short Term Goal 7 Progress: Progressing toward goal Long Term Goals Long Term Goal 1: Patient will return to prior level of  function with all daily activities.  Long Term Goal 1 Progress: Progressing toward goal Long Term Goal 2: Patient will improve right grip strength by 20 pounds and pinch strength by 8 pounds for increased ability to open containers. Long Term Goal 2 Progress: Progressing toward  goal Long Term Goal 3: Patient will improve [place] in right hand by completing Nine Hole Peg Test in less than 27". Long Term Goal 3 Progress: Progressing toward goal Long Term Goal 4: Patient will attend to his right side in community ambulation with 90% accuracy for increased safety. Long Term Goal 4 Progress: Progressing toward goal Long Term Goal 5: Patient will improve ability to express self in written manner from good to good +. Long Term Goal 5 Progress: Progressing toward goal Long Term Goal 6: Patient will improve visual motor integration skills from good to good +. Long Term Goal 6 Progress: Progressing toward goal Long Term Goal 7: Patient will improve visual perceptual skills from good to good +.  Problem List Patient Active Problem List   Diagnosis Date Noted  . Impairment of visual perception 12/19/2013  . Lack of coordination 12/19/2013  . Muscle weakness (generalized) 12/19/2013  . Aphasia following cerebrovascular disease 12/11/2013  . Acute ischemic stroke 11/03/2013  . Acute renal failure 11/03/2013  . COPD (chronic obstructive pulmonary disease) 08/06/2012  . Tobacco abuse 08/06/2012  . Memory difficulties 08/06/2012  . Hyperlipidemia 08/06/2012  . CVA (cerebral infarction) 08/05/2012  . HTN (hypertension) 08/05/2012  . CONSTIPATION 05/14/2009  . CHANGE IN BOWELS 05/14/2009  . COLONIC POLYPS, HX OF 05/14/2009  . SHOULDER, ARTHRITIS, DEGEN./OSTEO 03/14/2008  . SHOULDER PAIN 03/14/2008  . IMPINGEMENT SYNDROME 03/14/2008  . RUPTURE ROTATOR CUFF 03/14/2008    End of Session Activity Tolerance: Patient tolerated treatment well General Behavior During Therapy: [facility] for tasks assessed/performed  GO    Donney Rankins, OTR/L  01/08/2014, 9:59 PM

## 2014-01-12 ENCOUNTER — Ambulatory Visit (HOSPITAL_COMMUNITY): Payer: Self-pay | Admitting: Speech Pathology

## 2014-01-15 DIAGNOSIS — G8929 Other chronic pain: Secondary | ICD-10-CM | POA: Diagnosis not present

## 2014-01-15 DIAGNOSIS — IMO0002 Reserved for concepts with insufficient information to code with codable children: Secondary | ICD-10-CM | POA: Diagnosis not present

## 2014-01-18 ENCOUNTER — Ambulatory Visit (HOSPITAL_COMMUNITY)
Admission: RE | Admit: 2014-01-18 | Discharge: 2014-01-18 | Disposition: A | Discharge status: Home / Self Care | Payer: Medicare Other | Source: Ambulatory Visit | Attending: Internal Medicine | Admitting: Internal Medicine

## 2014-01-18 DIAGNOSIS — J449 Chronic obstructive pulmonary disease, unspecified: Secondary | ICD-10-CM | POA: Diagnosis not present

## 2014-01-18 DIAGNOSIS — I69920 Aphasia following unspecified cerebrovascular disease: Secondary | ICD-10-CM

## 2014-01-18 DIAGNOSIS — IMO0001 Reserved for inherently not codable concepts without codable children: Secondary | ICD-10-CM | POA: Diagnosis not present

## 2014-01-18 DIAGNOSIS — I10 Essential (primary) hypertension: Secondary | ICD-10-CM | POA: Diagnosis not present

## 2014-01-18 NOTE — Progress Notes (Signed)
Speech Language Pathology Treatment Patient Details  Name: Rondrick Scherger MRN: [id number] Date of Birth: June 07, 1941  Today's Date: 01/18/2014 Time: 1130-1210 SLP Time Calculation (min): 40 min  Authorization: Medicare  Authorization Time Period: 01/09/2014-02/06/2014  Authorization Visit#:  5 of     HPI:  Symptoms/Limitations Symptoms: "I know what I want to say, but it won't come out." Pain Assessment Currently in Pain?: No/denies  Treatment  Cognitive-Linguistic Therapy Aphasia Therapy Patient/Family Education Home Exercise Program  SLP Goals  Home Exercise SLP Goal: Patient will Perform Home Exercise Program: with supervision, verbal cues required/provided SLP Goal: Perform Home Exercise Program - Progress: Progressing toward goal SLP Short Term Goals SLP Short Term Goal 1: Pt will increase divergent naming to 10+ items for concrete categories with min cues. SLP Short Term Goal 1 - Progress: Progressing toward goal SLP Short Term Goal 2: Pt will increase divergent naming to 6+ items for abstract categories with mild/mod cues. SLP Short Term Goal 2 - Progress: Progressing toward goal SLP Short Term Goal 3: Pt will increase reading comprehension to 90% acc for short paragraph-length material with multiple choice responses. SLP Short Term Goal 3 - Progress: Progressing toward goal SLP Short Term Goal 4: Pt will increase written expression for personal/bio info to 100% acc with mi/mod cues provided. SLP Short Term Goal 4 - Progress: Progressing toward goal SLP Short Term Goal 5: Pt will self correct paraphasic speech errors in conversational speech 90% of the time with min cues and use of fluency enhancing strategies. SLP Short Term Goal 5 - Progress: Progressing toward goal Additional SLP Short Term Goals?: Yes SLP Short Term Goal 6: Pt will complete basic level calculations (add, subtract, multiply, divide) with 90% acc and mod assist. SLP Short Term Goal 6 - Progress:  Progressing toward goal SLP Long Term Goals SLP Long Term Goal 1: Pt will increase verbal expression to [facility] for moderately complex conversations in home and community environment with use of strategies. SLP Long Term Goal 1 - Progress: Progressing toward goal SLP Long Term Goal 2: Pt will increase reading comprehension to [facility] for paragraph-length material  with use of strategies. SLP Long Term Goal 2 - Progress: Progressing toward goal SLP Long Term Goal 3: Pt will increase written expression to [facility] for personal/bio and short sentences with use of strategies. SLP Long Term Goal 3 - Progress: Progressing toward goal  Assessment/Plan  Patient Active Problem List   Diagnosis Date Noted  . Impairment of visual perception 12/19/2013  . Lack of coordination 12/19/2013  . Muscle weakness (generalized) 12/19/2013  . Aphasia following cerebrovascular disease 12/11/2013  . Acute ischemic stroke 11/03/2013  . Acute renal failure 11/03/2013  . COPD (chronic obstructive pulmonary disease) 08/06/2012  . Tobacco abuse 08/06/2012  . Memory difficulties 08/06/2012  . Hyperlipidemia 08/06/2012  . CVA (cerebral infarction) 08/05/2012  . HTN (hypertension) 08/05/2012  . CONSTIPATION 05/14/2009  . CHANGE IN BOWELS 05/14/2009  . COLONIC POLYPS, HX OF 05/14/2009  . SHOULDER, ARTHRITIS, DEGEN./OSTEO 03/14/2008  . SHOULDER PAIN 03/14/2008  . IMPINGEMENT SYNDROME 03/14/2008  . RUPTURE ROTATOR CUFF 03/14/2008   SLP - End of Session Activity Tolerance: Patient tolerated treatment well General Behavior During Therapy: WFL for tasks assessed/performed  SLP Assessment/Plan Clinical Impression: Mr. Bathurst has been discharged from OT services to home program. SLP session focused on naming, spelling, and writing of single words/objects. Impulsivity continues to interfere with accurate responses. Mr. Yackel required mod/max cues throughout the session for attention to task (tangential and  perseverative about  current difficulties). He benefited from direct, step-by-step directions using concise language. He attempts to verbally mediate during writing tasks, but it is ineffective because he repeats multiple letters over and over again. He needed cues from clinician to name the picture, spell it aloud, and then say one letter at a time and writing it before going on the next letter. Mr. Kallay was reminded to take a break/switch task briefly when he gets "stuck" otherwise he becomes more frustrated which makes it more difficult to verbalize his intent. When he slows down (reading/writing/speaking), fluency and or accuracy improves.   Treatment/Interventions: Language facilitation;Cueing hierarchy;Patient/family education;Compensatory strategies;SLP instruction and feedback Potential to Achieve Goals: Good Potential Considerations: Cooperation/participation level  GN    PORTER,DABNEY 01/18/2014, 12:11 PM

## 2014-01-22 ENCOUNTER — Ambulatory Visit (HOSPITAL_COMMUNITY): Payer: Self-pay | Admitting: Specialist

## 2014-01-22 ENCOUNTER — Ambulatory Visit (HOSPITAL_COMMUNITY)
Admission: RE | Admit: 2014-01-22 | Discharge: 2014-01-22 | Disposition: A | Discharge status: Home / Self Care | Payer: Medicare Other | Source: Ambulatory Visit | Attending: Internal Medicine | Admitting: Internal Medicine

## 2014-01-22 DIAGNOSIS — I69920 Aphasia following unspecified cerebrovascular disease: Secondary | ICD-10-CM

## 2014-01-22 DIAGNOSIS — J449 Chronic obstructive pulmonary disease, unspecified: Secondary | ICD-10-CM | POA: Diagnosis not present

## 2014-01-22 DIAGNOSIS — IMO0001 Reserved for inherently not codable concepts without codable children: Secondary | ICD-10-CM | POA: Diagnosis not present

## 2014-01-22 DIAGNOSIS — I10 Essential (primary) hypertension: Secondary | ICD-10-CM | POA: Diagnosis not present

## 2014-01-22 NOTE — Progress Notes (Addendum)
Speech Language Pathology Treatment Patient Details  Name: Glenn [name] MRN: [id number] Date of Birth: [birth year]  Today's Date: 01/22/2014 Time: [id number] SLP Time Calculation (min): 44 min  Authorization: Medicare  Authorization Time Period: 01/09/2014-02/06/2014  Authorization Visit#:  6 of  8  HPI:  Symptoms/Limitations Symptoms: "I know what my problem is." Pain Assessment Currently in Pain?: No/denies   Treatment  Cognitive-Linguistic Therapy Aphasia Therapy Patient/Family Education Home Exercise Program  SLP Goals  Home Exercise SLP Goal: Patient will Perform Home Exercise Program: with supervision, verbal cues required/provided SLP Goal: Perform Home Exercise Program - Progress: Progressing toward goal SLP Short Term Goals SLP Short Term Goal 1: Pt will increase divergent naming to 10+ items for concrete categories with min cues. SLP Short Term Goal 1 - Progress: Progressing toward goal SLP Short Term Goal 2: Pt will increase divergent naming to 6+ items for abstract categories with mild/mod cues. SLP Short Term Goal 2 - Progress: Progressing toward goal SLP Short Term Goal 3: Pt will increase reading comprehension to 90% acc for short paragraph-length material with multiple choice responses. SLP Short Term Goal 3 - Progress: Progressing toward goal SLP Short Term Goal 4: Pt will increase written expression for personal/bio info to 100% acc with mi/mod cues provided. SLP Short Term Goal 4 - Progress: Progressing toward goal SLP Short Term Goal 5: Pt will self correct paraphasic speech errors in conversational speech 90% of the time with min cues and use of fluency enhancing strategies. SLP Short Term Goal 5 - Progress: Progressing toward goal Additional SLP Short Term Goals?: Yes SLP Short Term Goal 6: Pt will complete basic level calculations (add, subtract, multiply, divide) with 90% acc and mod assist. SLP Short Term Goal 6 - Progress: Progressing toward goal SLP  Long Term Goals SLP Long Term Goal 1: Pt will increase verbal expression to [facility] for moderately complex conversations in home and community environment with use of strategies. SLP Long Term Goal 1 - Progress: Progressing toward goal SLP Long Term Goal 2: Pt will increase reading comprehension to [facility] for paragraph-length material  with use of strategies. SLP Long Term Goal 2 - Progress: Progressing toward goal SLP Long Term Goal 3: Pt will increase written expression to [facility] for personal/bio and short sentences with use of strategies. SLP Long Term Goal 3 - Progress: Progressing toward goal  Assessment/Plan  Patient Active Problem List   Diagnosis Date Noted  . Impairment of visual perception 12/19/2013  . Lack of coordination 12/19/2013  . Muscle weakness (generalized) 12/19/2013  . Aphasia following cerebrovascular disease 12/11/2013  . Acute ischemic stroke 11/03/2013  . Acute renal failure 11/03/2013  . COPD (chronic obstructive pulmonary disease) 08/06/2012  . Tobacco abuse 08/06/2012  . Memory difficulties 08/06/2012  . Hyperlipidemia 08/06/2012  . CVA (cerebral infarction) 08/05/2012  . HTN (hypertension) 08/05/2012  . CONSTIPATION 05/14/2009  . CHANGE IN BOWELS 05/14/2009  . COLONIC POLYPS, HX OF 05/14/2009  . SHOULDER, ARTHRITIS, DEGEN./OSTEO 03/14/2008  . SHOULDER PAIN 03/14/2008  . IMPINGEMENT SYNDROME 03/14/2008  . RUPTURE ROTATOR CUFF 03/14/2008   SLP - End of Session Activity Tolerance: Patient tolerated treatment well General Behavior During Therapy: WFL for tasks assessed/performed  SLP Assessment/Plan Clinical Impression Statement: Mr. Glenn [name] completed assigned homework and it was reviewed with txist in session. Mr. Glenn [name] initially needed cues to stay on task as many of his responses were irrelevant and tangential. He demonstrated improved focus and was able to self monitor most of the time to decrease rate.  During oral reading tasks, he benefited from narrowing  visual scope of words (with paper to cover text  beyond the line he was reading) and cues to verbally mediate and point to one word at a time. Otherwise, verbal sequencing was impacted and speech was dysfluent. He completed reading comprehension questions with 100% acc with allowance to review text. Mr. Glenn [name] was able to sequence three picture cards and verbalize content with ~90% acc when given min cues. Plan to bridge this exercise next session by having him verbalize sequence before reording pictures.  Treatment/Interventions: Language facilitation;Cueing hierarchy;Patient/family education;Compensatory strategies;SLP instruction and feedback Potential to Achieve Goals: Good Potential Considerations: Cooperation/participation level     Thank you,  Genene Churn, [place] 01/22/2014, 12:10 PM

## 2014-01-24 NOTE — Evaluation (Addendum)
Occupational Therapy Discharge   Patient Details  Name: Glenn [name] MRN: [id number] Date of Birth: 1941-05-17  Today's Date: 01/18/2014 Time: 9935-7017 OT Time Calculation (min): 43 min Reassessment 1018-1038 (20') Cognition 1038-1101 (23')  Visit#: 4 of 16  Re-eval:       Authorization: Medicare  Authorization Time Period: before 10th visit  Authorization Visit#: 4 of 10   Past Medical History:  Past Medical History  Diagnosis Date  . HTN (hypertension)   . Memory changes   . Back pain   . GERD (gastroesophageal reflux disease)   . Stroke     2001 no residual symptom, 2013 no residual symptom  . COPD (chronic obstructive pulmonary disease)   . Tobacco use    Past Surgical History:  Past Surgical History  Procedure Laterality Date  . Back surgery      4 surgeries  . Carpal tunnel release Bilateral     2 surger    Subjective Symptoms/Limitations Symptoms: S:  I went through this stuff and check it and check it again.... I think I am doing very well.   Pain Assessment Currently in Pain?: No/denies  Precautions/Restrictions  Precautions Precautions: None  Balance Screening Balance Screen Has the patient fallen in the past 6 months: No   Assessment     01/18/14 1000  Assessment  Diagnosis left CVA  Prior Therapy currently recieving ST  Precautions  Precautions None  Balance Screen  Has the patient fallen in the past 6 months No  ADL  ADL Comments Patient states he feels as though his writng is ';pretty good'  and states he is rarely running into any wallys   Vision - History  Baseline Vision Wears glasses all the time  Vision - Assessment  Eye Alignment [facility]padd [facility]  Alignment/Gaze Preference WDL  Tracking/Visual Pursuits Able to track stimulus in all quads without difficulty  Saccades WFL  Convergence (limited convergence both eyes )  Visual Fields No apparent deficits  Diplopia Assessment [facility] )  Additional Comments Peripheral vision vastly improved  and assessed at [facility]   Perception  Inattention/Neglect (utilizes compensatory tech to visually attend to right side.)  [place] not assessed  Cognition  Memory (perseveration on difficult tasks, random topics. )  Problem Solving Impaired  Sequencing Impaired  Organizing Impaired  Organizing Impairment Verbal complex;Functional complex  Behaviors Impulsive;Verbal agitation;Poor frustration tolerance  Observation/Other Assessments  Observations acceptable scannin pattern this date.   Coordination  9 Hole Peg Test right 26.83" left 21..34" (right 29.14" left 20.0")  RUE AROM (degrees)  RUE Overall AROM Comments AROM is WNL  RUE Strength  Grip (lbs) 80 (Right 50 Left 87 (72))  Written Expression  Dominant Hand Right       Exercise/Treatments Cognitive Exercises  Worked with patient on mofifications for visual scanning and cont attention to handwriting placement.  patietn demos good + legibility and improved line placement.  Patient with noted increased frustration secondary to increased difficulty organizing thoughts for tasks and perseveration on 'dots' again for activiites.            Occupational Therapy Assessment and Plan OT Assessment and Plan Clinical Impression Statement: A:  reassessment/discharge this date.  Patient has met 7/7 STG, 5/7 LTG adn partially meeting remaining additional 2 goals requiring supervision for safety secondary to cognitive difficulties.  Patient noted to become frustrated at activities this date not because he visually could not identify but could not problem solve, sequence to complete tasks.  encouraged patient to continue to  work on Administrator, [employer] with modifications recommdended OT Plan: P:  Discharge from skilled OT services at this time.     Goals Short Term Goals Short Term Goal 1: Patient will be educated on a HEP. Short Term Goal 1 Progress: Met Short Term Goal 2: Patient will improve right grip strength by 10 pounds and pinch strength  by 2 pounds for increased ability to open containers. Short Term Goal 2 Progress: Met Short Term Goal 3: Patient will improve [place] by oppoising right thumb to all digits in less than 3". Short Term Goal 3 Progress: Met Short Term Goal 4: Patient will attend to his right side in community ambulation with 75% accuracy for increased safety. Short Term Goal 4 Progress: Met Short Term Goal 5: Patient will improve ability to express self in written manner from fair to good. Short Term Goal 5 Progress: Met Short Term Goal 6: Patient will improve visual motor integration skills from fair to good. Short Term Goal 6 Progress: Met Short Term Goal 7: Patient will improve visual perceptual skills from fair to good. Short Term Goal 7 Progress: Met Long Term Goals Long Term Goal 1:  (continues to require supervision for safety) Long Term Goal 1 Progress: Partly met Long Term Goal 2: Patient will improve right grip strength by 20 pounds and pinch strength by 8 pounds for increased ability to open containers. Long Term Goal 2 Progress: Met Long Term Goal 3: Patient will improve [place] in right hand by completing Nine Hole Peg Test in less than 27". Long Term Goal 3 Progress: Met Long Term Goal 4: Patient will attend to his right side in community ambulation with 90% accuracy for increased safety. Long Term Goal 4 Progress: Met Long Term Goal 5: Patient will improve ability to express self in written manner from good to good +. (increased frustration for organizing thoughts for expression not writing activiites) Long Term Goal 5 Progress: Partly met Long Term Goal 6: Patient will improve visual motor integration skills from good to good +. Long Term Goal 6 Progress: Met Long Term Goal 7: Patient will improve visual perceptual skills from good to good +. Long Term Goal 7 Progress: Met  Problem List Patient Active Problem List   Diagnosis Date Noted  . Impairment of visual perception 12/19/2013  . Lack of  coordination 12/19/2013  . Muscle weakness (generalized) 12/19/2013  . Aphasia following cerebrovascular disease 12/11/2013  . Acute ischemic stroke 11/03/2013  . Acute renal failure 11/03/2013  . COPD (chronic obstructive pulmonary disease) 08/06/2012  . Tobacco abuse 08/06/2012  . Memory difficulties 08/06/2012  . Hyperlipidemia 08/06/2012  . CVA (cerebral infarction) 08/05/2012  . HTN (hypertension) 08/05/2012  . CONSTIPATION 05/14/2009  . CHANGE IN BOWELS 05/14/2009  . COLONIC POLYPS, HX OF 05/14/2009  . SHOULDER, ARTHRITIS, DEGEN./OSTEO 03/14/2008  . SHOULDER PAIN 03/14/2008  . IMPINGEMENT SYNDROME 03/14/2008  . RUPTURE ROTATOR CUFF 03/14/2008    End of Session Activity Tolerance: Patient tolerated treatment well General Behavior During Therapy: [facility] for tasks assessed/performed  GO Functional Assessment Tool Used: Grip strength with Dynamometer R/L 80/87=92%  (initial 50/72 = 69%) Functional Limitation: Carrying, moving and handling objects Carrying, Moving and Handling Objects Goal Status ([id number]): At least 1 percent but less than 20 percent impaired, limited or restricted Carrying, Moving and Handling Objects Discharge Status [phone]): At least 1 percent but less than 20 percent impaired, limited or restricted  Donney Rankins, OTR/L  01/18/2014, 9:51 PM  Physician Documentation Your signature is required to  indicate approval of the treatment plan as stated above.  Please sign and either send electronically or make a copy of this report for your files and return this physician signed original.  Please mark one 1.__approve of plan  2. ___approve of plan with the following conditions.   ______________________________                                                          _____________________ Physician Signature                                                                                                             Date

## 2014-01-25 ENCOUNTER — Ambulatory Visit (HOSPITAL_COMMUNITY): Payer: Self-pay | Admitting: Speech Pathology

## 2014-01-25 ENCOUNTER — Ambulatory Visit (HOSPITAL_COMMUNITY): Payer: Self-pay | Admitting: Occupational Therapy

## 2014-01-29 ENCOUNTER — Ambulatory Visit (HOSPITAL_COMMUNITY): Payer: Self-pay | Admitting: Specialist

## 2014-01-29 ENCOUNTER — Ambulatory Visit (HOSPITAL_COMMUNITY)
Admission: RE | Admit: 2014-01-29 | Discharge: 2014-01-29 | Disposition: A | Discharge status: Home / Self Care | Payer: Medicare Other | Source: Ambulatory Visit | Attending: Internal Medicine | Admitting: Internal Medicine

## 2014-01-29 DIAGNOSIS — J449 Chronic obstructive pulmonary disease, unspecified: Secondary | ICD-10-CM | POA: Diagnosis not present

## 2014-01-29 DIAGNOSIS — I69920 Aphasia following unspecified cerebrovascular disease: Secondary | ICD-10-CM | POA: Diagnosis not present

## 2014-01-29 DIAGNOSIS — J4489 Other specified chronic obstructive pulmonary disease: Secondary | ICD-10-CM | POA: Insufficient documentation

## 2014-01-29 DIAGNOSIS — I10 Essential (primary) hypertension: Secondary | ICD-10-CM | POA: Diagnosis not present

## 2014-01-29 DIAGNOSIS — IMO0001 Reserved for inherently not codable concepts without codable children: Secondary | ICD-10-CM | POA: Diagnosis not present

## 2014-01-30 NOTE — Progress Notes (Signed)
Speech Language Pathology Treatment Patient Details  Name: Glenn [name] MRN: [id number] Date of Birth: [birth year]  Today's Date: 01/30/2014 Time: 11:15 AM  - 12:00 PM    Authorization: Medicare  Authorization Time Period: 01/09/2014-02/06/2014  Authorization Visit#:  7 of   8  HPI:         Treatment  Apraxia Therapy Aphasia Therapy Patient/Family Education Home Exercise Program  SLP Goals    Home Exercise  SLP Goal: Patient will Perform Home Exercise Program: with supervision, verbal cues required/provided  SLP Goal: Perform Home Exercise Program - Progress: Progressing toward goal  SLP Short Term Goals  SLP Short Term Goal 1: Pt will increase divergent naming to 10+ items for concrete categories with min cues.  SLP Short Term Goal 1 - Progress: Progressing toward goal  SLP Short Term Goal 2: Pt will increase divergent naming to 6+ items for abstract categories with mild/mod cues.  SLP Short Term Goal 2 - Progress: Progressing toward goal  SLP Short Term Goal 3: Pt will increase reading comprehension to 90% acc for short paragraph-length material with multiple choice responses.  SLP Short Term Goal 3 - Progress: Progressing toward goal  SLP Short Term Goal 4: Pt will increase written expression for personal/bio info to 100% acc with mi/mod cues provided.  SLP Short Term Goal 4 - Progress: Progressing toward goal  SLP Short Term Goal 5: Pt will self correct paraphasic speech errors in conversational speech 90% of the time with min cues and use of fluency enhancing strategies.  SLP Short Term Goal 5 - Progress: Progressing toward goal  Additional SLP Short Term Goals?: Yes  SLP Short Term Goal 6: Pt will complete basic level calculations (add, subtract, multiply, divide) with 90% acc and mod assist.  SLP Short Term Goal 6 - Progress: Progressing toward goal  SLP Long Term Goals  SLP Long Term Goal 1: Pt will increase verbal expression to [facility] for moderately complex conversations  in home and community environment with use of strategies.  SLP Long Term Goal 1 - Progress: Progressing toward goal  SLP Long Term Goal 2: Pt will increase reading comprehension to [facility] for paragraph-length material with use of strategies.  SLP Long Term Goal 2 - Progress: Progressing toward goal  SLP Long Term Goal 3: Pt will increase written expression to [facility] for personal/bio and short sentences with use of strategies.  SLP Long Term Goal 3 - Progress: Progressing toward goal  Assessment/Plan  Patient Active Problem List   Diagnosis Date Noted  . Impairment of visual perception 12/19/2013  . Lack of coordination 12/19/2013  . Muscle weakness (generalized) 12/19/2013  . Aphasia following cerebrovascular disease 12/11/2013  . Acute ischemic stroke 11/03/2013  . Acute renal failure 11/03/2013  . COPD (chronic obstructive pulmonary disease) 08/06/2012  . Tobacco abuse 08/06/2012  . Memory difficulties 08/06/2012  . Hyperlipidemia 08/06/2012  . CVA (cerebral infarction) 08/05/2012  . HTN (hypertension) 08/05/2012  . CONSTIPATION 05/14/2009  . CHANGE IN BOWELS 05/14/2009  . COLONIC POLYPS, HX OF 05/14/2009  . SHOULDER, ARTHRITIS, DEGEN./OSTEO 03/14/2008  . SHOULDER PAIN 03/14/2008  . IMPINGEMENT SYNDROME 03/14/2008  . RUPTURE ROTATOR CUFF 03/14/2008   Glenn [name] was excited to show me his completed homework which he completed with 95% acc. He expressed frustration over having trouble reading text designed for a school aged child. He reported that he thought it was due to visual difficulties, however when assessed in session it was apparent that it had little do do with  vision and more with language difficulties. Pt benefitted from clinician assist to point to words sequentially while covering up other text to aide in oral reading fluency. He was unable to replicate this himself, as he would do it for a word or two with success and then abandon the strategies. Glenn [name] was excited to tell me  that he tried typing up some of his favorite artists on you tube. Although it was reportedly frustrating, he managed to type in several bands. His homework is to write down four more bands that he likes to listen to. Continue plan of care.       Reviewed and in agreement with treatment provided  Thank you,  Genene Churn, [place]       [place] 01/30/2014, 6:53 PM

## 2014-02-01 ENCOUNTER — Ambulatory Visit (HOSPITAL_COMMUNITY)
Admission: RE | Admit: 2014-02-01 | Discharge: 2014-02-01 | Disposition: A | Discharge status: Home / Self Care | Payer: Medicare Other | Source: Ambulatory Visit | Attending: Internal Medicine | Admitting: Internal Medicine

## 2014-02-01 ENCOUNTER — Ambulatory Visit (HOSPITAL_COMMUNITY): Payer: Self-pay | Admitting: Specialist

## 2014-02-01 DIAGNOSIS — I69920 Aphasia following unspecified cerebrovascular disease: Secondary | ICD-10-CM

## 2014-02-01 NOTE — Progress Notes (Signed)
Speech Language Pathology Treatment Patient Details   Name: Glenn [name] MRN: [id number] Date of Birth: September 25, 1941  Today's Date: 02/01/2014 Time: 1120-1205 SLP Time Calculation (min): 45 min  Authorization: Medicare  Authorization Time Period: 01/09/2014-02/06/2014  Authorization Visit#: 8  of 9   HPI: Pt seen for ongoing Aphasia therapy following a stroke.     Treatment  Aphasia Therapy  SLP Goals  Home Exercise SLP Goal: Patient will Perform Home Exercise Program: with supervision, verbal cues required/provided SLP Goal: Perform Home Exercise Program - Progress: Progressing toward goal SLP Short Term Goals SLP Short Term Goal 1: Pt will increase divergent naming to 10+ items for concrete categories with min cues. SLP Short Term Goal 1 - Progress: Progressing toward goal SLP Short Term Goal 2: Pt will increase divergent naming to 6+ items for abstract categories with mild/mod cues. SLP Short Term Goal 2 - Progress: Progressing toward goal SLP Short Term Goal 3: Pt will increase reading comprehension to 90% acc for short paragraph-length material with multiple choice responses. SLP Short Term Goal 3 - Progress: Progressing toward goal SLP Short Term Goal 4: Pt will increase written expression for personal/bio info to 100% acc with mi/mod cues provided. SLP Short Term Goal 4 - Progress: Progressing toward goal SLP Short Term Goal 5: Pt will self correct paraphasic speech errors in conversational speech 90% of the time with min cues and use of fluency enhancing strategies. SLP Short Term Goal 5 - Progress: Progressing toward goal Additional SLP Short Term Goals?: Yes SLP Short Term Goal 6: Pt will complete basic level calculations (add, subtract, multiply, divide) with 90% acc and mod assist. SLP Short Term Goal 6 - Progress: Progressing toward goal SLP Long Term Goals SLP Long Term Goal 1: Pt will increase verbal expression to [facility] for moderately complex conversations in home and  community environment with use of strategies. SLP Long Term Goal 1 - Progress: Progressing toward goal SLP Long Term Goal 2: Pt will increase reading comprehension to [facility] for paragraph-length material  with use of strategies. SLP Long Term Goal 2 - Progress: Progressing toward goal SLP Long Term Goal 3: Pt will increase written expression to [facility] for personal/bio and short sentences with use of strategies. SLP Long Term Goal 3 - Progress: Progressing toward goal  Assessment/Plan  Patient Active Problem List   Diagnosis Date Noted  . Impairment of visual perception 12/19/2013  . Lack of coordination 12/19/2013  . Muscle weakness (generalized) 12/19/2013  . Aphasia following cerebrovascular disease 12/11/2013  . Acute ischemic stroke 11/03/2013  . Acute renal failure 11/03/2013  . COPD (chronic obstructive pulmonary disease) 08/06/2012  . Tobacco abuse 08/06/2012  . Memory difficulties 08/06/2012  . Hyperlipidemia 08/06/2012  . CVA (cerebral infarction) 08/05/2012  . HTN (hypertension) 08/05/2012  . CONSTIPATION 05/14/2009  . CHANGE IN BOWELS 05/14/2009  . COLONIC POLYPS, HX OF 05/14/2009  . SHOULDER, ARTHRITIS, DEGEN./OSTEO 03/14/2008  . SHOULDER PAIN 03/14/2008  . IMPINGEMENT SYNDROME 03/14/2008  . RUPTURE ROTATOR CUFF 03/14/2008   SLP - End of Session Activity Tolerance: Patient tolerated treatment well General Behavior During Therapy: WFL for tasks assessed/performed  SLP Assessment/Plan Clinical Impression Statement: Mr. Glenn [name] enthusiastically showed the clinician the 5 band names he wrote down for homework but emphatically communicated his frustration while trying to write them.  He also completed a reading comprehension assignment with 100% accuracy and reported using strategies such as looking back at the paragraph to answer multiple choice questions regarding the passage.  The pt  demonstrated decreased impulsivity and frustration during initial written expression task where  he wrote a word associated with the presented word with min cues.  During a second written expression task, he wrote down basic information about himself on a form with min cues.  Towards the end of the session, fatigue seemed to negatively impact the pt's performance because he demonstrated an increase in his frustration level, although he still used the compensatory strategy of stopping to take a breath before continuing with the task. Mr. Glenn [name] was informed about his improved progress. The pt was asked to look up a specific song on the internet and to complete another reading comprehension task for home assignments. Continue with current plan of care.  Treatment/Interventions: Language facilitation;Cueing hierarchy;Patient/family education;Compensatory strategies;SLP instruction and feedback Potential to Achieve Goals: Good Potential Considerations: Cooperation/participation level  Thank you, Amaryllis Dyke, SLP-Student [phone]   Sonji Starkes 02/01/2014, 3:41 PM

## 2014-02-05 ENCOUNTER — Ambulatory Visit (HOSPITAL_COMMUNITY): Payer: Self-pay | Admitting: Occupational Therapy

## 2014-02-05 NOTE — Progress Notes (Signed)
Reviewed and in agreement with treatment provided  Thank you,  Genene Churn, [place]

## 2014-02-08 ENCOUNTER — Ambulatory Visit (HOSPITAL_COMMUNITY)
Admission: RE | Admit: 2014-02-08 | Discharge: 2014-02-08 | Disposition: A | Discharge status: Home / Self Care | Payer: Medicare Other | Source: Ambulatory Visit | Attending: Internal Medicine | Admitting: Internal Medicine

## 2014-02-08 DIAGNOSIS — I69920 Aphasia following unspecified cerebrovascular disease: Secondary | ICD-10-CM

## 2014-02-08 NOTE — Progress Notes (Addendum)
Speech Language Pathology Treatment Patient Details  Name: Dredan Timbers MRN: [id number] Date of Birth: 10-21-1941  Today's Date: 02/08/2014 Time: 1110-1155 SLP Time Calculation (min): 45 min  Authorization: Medicare  Authorization Time Period: 01/09/2014-02/06/2014  Authorization Visit#:  9 of 9   HPI:  Symptoms/Limitations Symptoms: Alert and cooperative Pain Assessment Currently in Pain?: No/denies  Treatment  Aphasia Therapy Patient/Caregiver Education Home Exercise Program    SLP Goals  Home Exercise SLP Goal: Patient will Perform Home Exercise Program: with supervision, verbal cues required/provided SLP Short Term Goals SLP Short Term Goal 1: Pt will increase divergent naming to 10+ items for concrete categories with min cues. SLP Short Term Goal 1 - Progress: Progressing toward goal SLP Short Term Goal 2: Pt will increase divergent naming to 6+ items for abstract categories with mild/mod cues. SLP Short Term Goal 2 - Progress: Progressing toward goal SLP Short Term Goal 3: Pt will increase reading comprehension to 90% acc for short paragraph-length material with multiple choice responses. SLP Short Term Goal 3 - Progress: Progressing toward goal SLP Short Term Goal 4: Pt will increase written expression for personal/bio info to 100% acc with mi/mod cues provided. SLP Short Term Goal 4 - Progress: Progressing toward goal SLP Short Term Goal 5: Pt will self correct paraphasic speech errors in conversational speech 90% of the time with min cues and use of fluency enhancing strategies. SLP Short Term Goal 5 - Progress: Progressing toward goal Additional SLP Short Term Goals?: Yes SLP Short Term Goal 6: Pt will complete basic level calculations (add, subtract, multiply, divide) with 90% acc and mod assist. SLP Short Term Goal 6 - Progress: Progressing toward goal SLP Long Term Goals SLP Long Term Goal 1: Pt will increase verbal expression to [facility] for moderately complex  conversations in home and community environment with use of strategies. SLP Long Term Goal 1 - Progress: Progressing toward goal SLP Long Term Goal 2: Pt will increase reading comprehension to [facility] for paragraph-length material  with use of strategies. SLP Long Term Goal 2 - Progress: Progressing toward goal SLP Long Term Goal 3: Pt will increase written expression to [facility] for personal/bio and short sentences with use of strategies. SLP Long Term Goal 3 - Progress: Progressing toward goal  Assessment/Plan  Patient Active Problem List   Diagnosis Date Noted  . Impairment of visual perception 12/19/2013  . Lack of coordination 12/19/2013  . Muscle weakness (generalized) 12/19/2013  . Aphasia following cerebrovascular disease 12/11/2013  . Acute ischemic stroke 11/03/2013  . Acute renal failure 11/03/2013  . COPD (chronic obstructive pulmonary disease) 08/06/2012  . Tobacco abuse 08/06/2012  . Memory difficulties 08/06/2012  . Hyperlipidemia 08/06/2012  . CVA (cerebral infarction) 08/05/2012  . HTN (hypertension) 08/05/2012  . CONSTIPATION 05/14/2009  . CHANGE IN BOWELS 05/14/2009  . COLONIC POLYPS, HX OF 05/14/2009  . SHOULDER, ARTHRITIS, DEGEN./OSTEO 03/14/2008  . SHOULDER PAIN 03/14/2008  . IMPINGEMENT SYNDROME 03/14/2008  . RUPTURE ROTATOR CUFF 03/14/2008   SLP - End of Session Activity Tolerance: Patient tolerated treatment well General Behavior During Therapy: WFL for tasks assessed/performed  SLP Assessment/Plan Clinical Impression Statement: Mr. Tellechea completed a word finding exercise and a reading comprehension exercise independently for home work with 100% accuracy. The pt reported that the assignment was "easy" and that he used strategies such as reading back over the passage to answer the questions. After skimming one of the assigned passages Mr. Nasca was asked to summarize and recall important facts from the passage  which he did with mod cues on the first trial but when  prompted to take his time and thoroughly read through the second passage before reporting, he independently reported all important facts demonstrating improved reading comprehension with strategies. During two written expression tasks, the pt wrote targeted words with min cues. The pt voiced frustration with not being able to "get out" specific letters and words, but did demonstrate notably decreased impulsivity and frustration.  Continue with current plan of care.   Speech Therapy Frequency: min 2x/week Treatment/Interventions: Language facilitation;Cueing hierarchy;Patient/family education;Compensatory strategies;SLP instruction and feedback Potential to Achieve Goals: Good Potential Considerations: Cooperation/participation level  G Codes: Spoken Language Expression: Current= CJ, Goal= CI  Thank you, Amaryllis Dyke, SLP-Student [phone]   Hopkins,Amelia 02/08/2014, 12:14 PM

## 2014-02-13 ENCOUNTER — Ambulatory Visit (HOSPITAL_COMMUNITY): Payer: Self-pay | Admitting: Speech Pathology

## 2014-02-15 ENCOUNTER — Ambulatory Visit (HOSPITAL_COMMUNITY): Payer: Self-pay | Admitting: Speech Pathology

## 2014-02-15 NOTE — Progress Notes (Signed)
Reviewed and in agreement with treatment provided  Thank you,  Genene Churn, [place]

## 2014-02-20 ENCOUNTER — Ambulatory Visit (HOSPITAL_COMMUNITY): Payer: Self-pay | Admitting: Speech Pathology

## 2014-02-22 ENCOUNTER — Ambulatory Visit (HOSPITAL_COMMUNITY): Payer: Self-pay | Admitting: Speech Pathology

## 2014-02-27 ENCOUNTER — Ambulatory Visit (HOSPITAL_COMMUNITY): Payer: Self-pay | Admitting: Speech Pathology

## 2014-03-01 ENCOUNTER — Ambulatory Visit (HOSPITAL_COMMUNITY)
Admission: RE | Admit: 2014-03-01 | Discharge: 2014-03-01 | Disposition: A | Discharge status: Home / Self Care | Payer: Medicare Other | Source: Ambulatory Visit | Attending: Internal Medicine | Admitting: Internal Medicine

## 2014-03-01 DIAGNOSIS — I10 Essential (primary) hypertension: Secondary | ICD-10-CM | POA: Insufficient documentation

## 2014-03-01 DIAGNOSIS — IMO0001 Reserved for inherently not codable concepts without codable children: Secondary | ICD-10-CM | POA: Insufficient documentation

## 2014-03-01 DIAGNOSIS — J449 Chronic obstructive pulmonary disease, unspecified: Secondary | ICD-10-CM | POA: Diagnosis not present

## 2014-03-01 DIAGNOSIS — J4489 Other specified chronic obstructive pulmonary disease: Secondary | ICD-10-CM | POA: Insufficient documentation

## 2014-03-01 DIAGNOSIS — I69920 Aphasia following unspecified cerebrovascular disease: Secondary | ICD-10-CM | POA: Diagnosis not present

## 2014-03-01 NOTE — Progress Notes (Signed)
Reviewed and in agreement with treatment provided  Thank you,  Genene Churn, [place]

## 2014-03-01 NOTE — Progress Notes (Signed)
Speech Language Pathology Treatment Patient Details  Name: Glenn [name] MRN: [id number] Date of Birth: 1941-12-15  Today's Date: 03/01/2014 Time: 0938-1829 SLP Time Calculation (min): 48 min  Authorization: Medicare  Authorization Time Period: 03/01/2014-03/29/2014  Authorization Visit#:  10 of 16  HPI:  Symptoms/Limitations Symptoms: "When I open my mouth and say a word, it may not follow what I want it to do." Pain Assessment Currently in Pain?: No/denies   Treatment  Aphasia Therapy Patient/Family Education Home Exercise Program   SLP Goals  Home Exercise SLP Goal: Patient will Perform Home Exercise Program: with supervision, verbal cues required/provided SLP Goal: Perform Home Exercise Program - Progress: Progressing toward goal SLP Short Term Goals SLP Short Term Goal 1: Pt will increase divergent naming to 10+ items for concrete categories with min cues. SLP Short Term Goal 1 - Progress: Progressing toward goal SLP Short Term Goal 2: Pt will increase divergent naming to 6+ items for abstract categories with mild/mod cues. SLP Short Term Goal 2 - Progress: Progressing toward goal SLP Short Term Goal 3: Pt will increase reading comprehension to 90% acc for short paragraph-length material with multiple choice responses. SLP Short Term Goal 3 - Progress: Met SLP Short Term Goal 4: Pt will increase written expression for personal/bio info to 100% acc with mi/mod cues provided. SLP Short Term Goal 4 - Progress: Progressing toward goal SLP Short Term Goal 5: Pt will self correct paraphasic speech errors in conversational speech 90% of the time with min cues and use of fluency enhancing strategies. SLP Short Term Goal 5 - Progress: Progressing toward goal Additional SLP Short Term Goals?: Yes SLP Short Term Goal 6: Pt will complete basic level calculations (add, subtract, multiply, divide) with 90% acc and mod assist. SLP Short Term Goal 6 - Progress: Progressing toward goal SLP  Long Term Goals SLP Long Term Goal 1: Pt will increase verbal expression to [facility] for moderately complex conversations in home and community environment with use of strategies. SLP Long Term Goal 1 - Progress: Progressing toward goal SLP Long Term Goal 2: Pt will increase reading comprehension to [facility] for paragraph-length material  with use of strategies. SLP Long Term Goal 2 - Progress: Progressing toward goal SLP Long Term Goal 3: Pt will increase written expression to [facility] for personal/bio and short sentences with use of strategies. SLP Long Term Goal 3 - Progress: Progressing toward goal  Assessment/Plan  Patient Active Problem List   Diagnosis Date Noted  . Impairment of visual perception 12/19/2013  . Lack of coordination 12/19/2013  . Muscle weakness (generalized) 12/19/2013  . Aphasia following cerebrovascular disease 12/11/2013  . Acute ischemic stroke 11/03/2013  . Acute renal failure 11/03/2013  . COPD (chronic obstructive pulmonary disease) 08/06/2012  . Tobacco abuse 08/06/2012  . Memory difficulties 08/06/2012  . Hyperlipidemia 08/06/2012  . CVA (cerebral infarction) 08/05/2012  . HTN (hypertension) 08/05/2012  . CONSTIPATION 05/14/2009  . CHANGE IN BOWELS 05/14/2009  . COLONIC POLYPS, HX OF 05/14/2009  . SHOULDER, ARTHRITIS, DEGEN./OSTEO 03/14/2008  . SHOULDER PAIN 03/14/2008  . IMPINGEMENT SYNDROME 03/14/2008  . RUPTURE ROTATOR CUFF 03/14/2008   SLP - End of Session Activity Tolerance: Patient tolerated treatment well General Behavior During Therapy: WFL for tasks assessed/performed  SLP Assessment/Plan Clinical Impression Statement: Mr. Daher verbalized that he feels he is "better" than he was and that by slowing down he gains back some "control". The clinician agreed with Mr. Fenech that he is improving and informed him of his progress in  therapy. The pt completed a reading comprehension task and a word finding fill in the blank task for homework; he completed both  homework tasks with 100% accuracy. Mr. Lasser stated that these tasks were "simple" for him.  The pt completed 4 concrete divergent naming activities and was able to name 10 items in a category for 1/4 categories presented. After removing the time constraint he generated at least 10 objects/items in all targeted categories when reminded of his word finding strategies and given moderate cues to stay focused on the task at hand. Plan to address math skills and abstract divergent naming in upcoming session.  Speech Therapy Frequency: min 2x/week Treatment/Interventions: Language facilitation;Cueing hierarchy;Patient/family education;Compensatory strategies;SLP instruction and feedback Potential to Achieve Goals: Good Potential Considerations: Cooperation/participation level   Functional Assessment Tool Used: Clinical Judgement Functional Limitations: Spoken language expressive Spoken Language Expression Current Status [phone]): At least 20 percent but less than 40 percent impaired, limited or restricted Spoken Language Expression Goal Status [phone]): At least 1 percent but less than 20 percent impaired, limited or restricted  Thank you, [facility], [place]   Palin Tristan 03/01/2014, 11:16 AM

## 2014-03-01 NOTE — Addendum Note (Signed)
Encounter addended by: Ephraim Hamburger, CCC-SLP on: 03/01/2014  3:22 PM<BR>     Documentation filed: Clinical Notes

## 2014-03-06 ENCOUNTER — Ambulatory Visit (HOSPITAL_COMMUNITY)
Admission: RE | Admit: 2014-03-06 | Discharge: 2014-03-06 | Disposition: A | Discharge status: Home / Self Care | Payer: Medicare Other | Source: Ambulatory Visit | Attending: Internal Medicine | Admitting: Internal Medicine

## 2014-03-06 DIAGNOSIS — I69920 Aphasia following unspecified cerebrovascular disease: Secondary | ICD-10-CM

## 2014-03-06 NOTE — Progress Notes (Signed)
Speech Language Pathology Treatment Patient Details  Name: Glenn [name] MRN: [id number] Date of Birth: 10-09-1941  Today's Date: 03/06/2014 Time: 1000-1100 SLP Time Calculation (min): 60 min  Authorization: Medicare  Authorization Time Period: 03/02/2015- 03/29/2014  Authorization Visit#:  11 of 16   HPI:  Symptoms/Limitations Symptoms: "I wrote a list of words this morning... I think I am doing better." Pain Assessment Currently in Pain?: No/denies    Treatment  Aphasia Therapy Patient/Family Education Home Exercise Program  SLP Goals  Home Exercise SLP Goal: Patient will Perform Home Exercise Program: with supervision, verbal cues required/provided SLP Goal: Perform Home Exercise Program - Progress: Progressing toward goal SLP Short Term Goals SLP Short Term Goal 1: Pt will increase divergent naming to 10+ items for concrete categories with min cues. SLP Short Term Goal 1 - Progress: Met SLP Short Term Goal 2: Pt will increase divergent naming to 6+ items for abstract categories with mild/mod cues. SLP Short Term Goal 2 - Progress: Progressing toward goal SLP Short Term Goal 3: Pt will increase reading comprehension to 90% acc for short paragraph-length material with multiple choice responses. SLP Short Term Goal 3 - Progress: Met SLP Short Term Goal 4: Pt will increase written expression for personal/bio info to 100% acc with mi/mod cues provided. SLP Short Term Goal 4 - Progress: Progressing toward goal SLP Short Term Goal 5: Pt will self correct paraphasic speech errors in conversational speech 90% of the time with min cues and use of fluency enhancing strategies. SLP Short Term Goal 5 - Progress: Met Additional SLP Short Term Goals?: Yes SLP Short Term Goal 6: Pt will complete basic level calculations (add, subtract, multiply, divide) with 90% acc and mod assist. SLP Short Term Goal 6 - Progress: Progressing toward goal SLP Long Term Goals SLP Long Term Goal 1: Pt will  increase verbal expression to [facility] for moderately complex conversations in home and community environment with use of strategies. SLP Long Term Goal 1 - Progress: Progressing toward goal SLP Long Term Goal 2: Pt will increase reading comprehension to [facility] for paragraph-length material  with use of strategies. SLP Long Term Goal 2 - Progress: Progressing toward goal SLP Long Term Goal 3: Pt will increase written expression to [facility] for personal/bio and short sentences with use of strategies. SLP Long Term Goal 3 - Progress: Progressing toward goal  Assessment/Plan  Patient Active Problem List   Diagnosis Date Noted  . Impairment of visual perception 12/19/2013  . Lack of coordination 12/19/2013  . Muscle weakness (generalized) 12/19/2013  . Aphasia following cerebrovascular disease 12/11/2013  . Acute ischemic stroke 11/03/2013  . Acute renal failure 11/03/2013  . COPD (chronic obstructive pulmonary disease) 08/06/2012  . Tobacco abuse 08/06/2012  . Memory difficulties 08/06/2012  . Hyperlipidemia 08/06/2012  . CVA (cerebral infarction) 08/05/2012  . HTN (hypertension) 08/05/2012  . CONSTIPATION 05/14/2009  . CHANGE IN BOWELS 05/14/2009  . COLONIC POLYPS, HX OF 05/14/2009  . SHOULDER, ARTHRITIS, DEGEN./OSTEO 03/14/2008  . SHOULDER PAIN 03/14/2008  . IMPINGEMENT SYNDROME 03/14/2008  . RUPTURE ROTATOR CUFF 03/14/2008   SLP - End of Session Activity Tolerance: Patient tolerated treatment well General Behavior During Therapy: WFL for tasks assessed/performed  SLP Assessment/Plan Clinical Impression Statement: Mrs. Pelc sat in during therapy session today. Pt was asked to write a letter to dictation consisting of 4 sentences. Performance was much improved and he only needed min cues for pacing and self correction. He continues to do a lot of self talk which  can be detrimental. When asked to read aloud what he wrote, he did not catch three word ommission and instead read it as I had  dicated. He caught the mistakes when I re-read it to him. Overall, he seemed pleased with his progress. Today, we also targeted mental calculations involving basic level multiplication and division (add 6 to 2 and divide by 4). He can complete one step calculations fairly easily, but needs to write down calculations when it involves two or more steps. He completed calculations with ~80% with mod cues given. Word problems and convergent naming tasks given for homework. Continue with plan of care with a focus on divergent naming and division problems. Speech Therapy Frequency: min 2x/week Treatment/Interventions: Language facilitation;Cueing hierarchy;Patient/family education;Compensatory strategies;SLP instruction and feedback Potential to Achieve Goals: Good Potential Considerations: Cooperation/participation level     Thank you,  Genene Churn, [place]  [place] 03/06/2014, 11:12 AM

## 2014-03-08 ENCOUNTER — Ambulatory Visit (HOSPITAL_COMMUNITY)
Admission: RE | Admit: 2014-03-08 | Discharge: 2014-03-08 | Disposition: A | Discharge status: Home / Self Care | Payer: Medicare Other | Source: Ambulatory Visit | Attending: Internal Medicine | Admitting: Internal Medicine

## 2014-03-08 DIAGNOSIS — I69920 Aphasia following unspecified cerebrovascular disease: Secondary | ICD-10-CM

## 2014-03-08 NOTE — Progress Notes (Signed)
Speech Language Pathology Treatment Patient Details  Name: Glenn [name] MRN: [id number] Date of Birth: [birth year]  Today's Date: 03/08/2014 Time: 6237-6283 SLP Time Calculation (min): 60 min  Authorization: Medicare  Authorization Time Period: 03/02/2015- 03/29/2014  Authorization Visit#:  12 of 16   HPI: Stroke with aphasia in November 2014.        Treatment  Aphasia Therapy Patient/Family Education Home Exercise Program  SLP Goals  Home Exercise SLP Goal: Patient will Perform Home Exercise Program: with supervision, verbal cues required/provided SLP Short Term Goals SLP Short Term Goal 1: Pt will increase divergent naming to 10+ items for concrete categories with min cues. SLP Short Term Goal 1 - Progress: Met SLP Short Term Goal 2: Pt will increase divergent naming to 6+ items for abstract categories with mild/mod cues. SLP Short Term Goal 2 - Progress: Progressing toward goal SLP Short Term Goal 3: Pt will increase reading comprehension to 90% acc for short paragraph-length material with multiple choice responses. SLP Short Term Goal 3 - Progress: Met SLP Short Term Goal 4: Pt will increase written expression for personal/bio info to 100% acc with mi/mod cues provided. SLP Short Term Goal 4 - Progress: Progressing toward goal SLP Short Term Goal 5: Pt will self correct paraphasic speech errors in conversational speech 90% of the time with min cues and use of fluency enhancing strategies. SLP Short Term Goal 5 - Progress: Met Additional SLP Short Term Goals?: Yes SLP Short Term Goal 6: Pt will complete basic level calculations (add, subtract, multiply, divide) with 90% acc and mod assist. SLP Short Term Goal 6 - Progress: Progressing toward goal SLP Long Term Goals SLP Long Term Goal 1: Pt will increase verbal expression to [facility] for moderately complex conversations in home and community environment with use of strategies. SLP Long Term Goal 1 - Progress: Progressing toward  goal SLP Long Term Goal 2: Pt will increase reading comprehension to [facility] for paragraph-length material  with use of strategies. SLP Long Term Goal 2 - Progress: Progressing toward goal SLP Long Term Goal 3: Pt will increase written expression to [facility] for personal/bio and short sentences with use of strategies. SLP Long Term Goal 3 - Progress: Progressing toward goal  Assessment/Plan  Patient Active Problem List   Diagnosis Date Noted  . Impairment of visual perception 12/19/2013  . Lack of coordination 12/19/2013  . Muscle weakness (generalized) 12/19/2013  . Aphasia following cerebrovascular disease 12/11/2013  . Acute ischemic stroke 11/03/2013  . Acute renal failure 11/03/2013  . COPD (chronic obstructive pulmonary disease) 08/06/2012  . Tobacco abuse 08/06/2012  . Memory difficulties 08/06/2012  . Hyperlipidemia 08/06/2012  . CVA (cerebral infarction) 08/05/2012  . HTN (hypertension) 08/05/2012  . CONSTIPATION 05/14/2009  . CHANGE IN BOWELS 05/14/2009  . COLONIC POLYPS, HX OF 05/14/2009  . SHOULDER, ARTHRITIS, DEGEN./OSTEO 03/14/2008  . SHOULDER PAIN 03/14/2008  . IMPINGEMENT SYNDROME 03/14/2008  . RUPTURE ROTATOR CUFF 03/14/2008   SLP - End of Session Activity Tolerance: Patient tolerated treatment well General Behavior During Therapy: WFL for tasks assessed/performed  SLP Assessment/Plan Clinical Impression Statement: Mr. Glenn [name] completed homework, but reported that he needed assistance from family. He continues to try to complete mental calculations in his head because "that's the way I've always done it". When allowed to complete a basic division (64/2), he accurately completed it in 95 seconds. When cued to write down his steps, he completed it in <8 seconds. He reports continued frustration with this at home. During change making  tasks, he needed moderate cues for accurate completion. Next session, plan to go to gift shop to have him purchase something and count change. He  has not ordered for himself when out to eat or purchased anything from cashier since his stroke in November. Continue with plan of care.  Speech Therapy Frequency: min 2x/week Treatment/Interventions: Language facilitation;Cueing hierarchy;Patient/family education;Compensatory strategies;SLP instruction and feedback Potential to Achieve Goals: Good Potential Considerations: Cooperation/participation level  GN    PORTER,DABNEY 03/08/2014, 10:53 AM

## 2014-03-13 ENCOUNTER — Ambulatory Visit (HOSPITAL_COMMUNITY): Payer: Self-pay | Admitting: Speech Pathology

## 2014-03-15 ENCOUNTER — Ambulatory Visit (HOSPITAL_COMMUNITY)
Admission: RE | Admit: 2014-03-15 | Discharge: 2014-03-15 | Disposition: A | Discharge status: Home / Self Care | Payer: Medicare Other | Source: Ambulatory Visit | Attending: Internal Medicine | Admitting: Internal Medicine

## 2014-03-15 DIAGNOSIS — I69920 Aphasia following unspecified cerebrovascular disease: Secondary | ICD-10-CM

## 2014-03-15 NOTE — Progress Notes (Signed)
Speech Language Pathology Treatment Patient Details  Name: Marino Rogerson MRN: [id number] Date of Birth: 01-28-1941  Today's Date: 03/15/2014 Time: 1607-3710 SLP Time Calculation (min): 55 min  Authorization: Medicare  Authorization Time Period: 03/02/2015- 03/29/2014  Authorization Visit#:  53 of 16   HPI: Pt is admitted with Lt parietal lobe stroke manifested primarily with expressive aphasia and high level balance dysfunction. He has had 2 prior strokes with full resolution. He lives with his wife and had been fully independent at home PTA.    Treatment  Aphasia Therapy Patient/Family Education Home Exercise Program    SLP Goals  Home Exercise SLP Goal: Patient will Perform Home Exercise Program: with supervision, verbal cues required/provided SLP Goal: Perform Home Exercise Program - Progress: Progressing toward goal SLP Short Term Goals SLP Short Term Goal 1: Pt will increase divergent naming to 10+ items for concrete categories with min cues. SLP Short Term Goal 1 - Progress: Progressing toward goal SLP Short Term Goal 2: Pt will increase divergent naming to 6+ items for abstract categories with mild/mod cues. SLP Short Term Goal 2 - Progress: Progressing toward goal SLP Short Term Goal 3: Pt will increase reading comprehension to 90% acc for short paragraph-length material with multiple choice responses. SLP Short Term Goal 3 - Progress: Met SLP Short Term Goal 4: Pt will increase written expression for personal/bio info to 100% acc with mi/mod cues provided. SLP Short Term Goal 4 - Progress: Progressing toward goal SLP Short Term Goal 5: Pt will self correct paraphasic speech errors in conversational speech 90% of the time with min cues and use of fluency enhancing strategies. SLP Short Term Goal 5 - Progress: Met Additional SLP Short Term Goals?: Yes SLP Short Term Goal 6: Pt will complete basic level calculations (add, subtract, multiply, divide) with 90% acc and mod  assist. SLP Short Term Goal 6 - Progress: Progressing toward goal SLP Long Term Goals SLP Long Term Goal 1: Pt will increase verbal expression to [facility] for moderately complex conversations in home and community environment with use of strategies. SLP Long Term Goal 1 - Progress: Progressing toward goal SLP Long Term Goal 2: Pt will increase reading comprehension to [facility] for paragraph-length material  with use of strategies. SLP Long Term Goal 2 - Progress: Progressing toward goal SLP Long Term Goal 3: Pt will increase written expression to [facility] for personal/bio and short sentences with use of strategies. SLP Long Term Goal 3 - Progress: Progressing toward goal  Assessment/Plan  Patient Active Problem List   Diagnosis Date Noted  . Impairment of visual perception 12/19/2013  . Lack of coordination 12/19/2013  . Muscle weakness (generalized) 12/19/2013  . Aphasia following cerebrovascular disease 12/11/2013  . Acute ischemic stroke 11/03/2013  . Acute renal failure 11/03/2013  . COPD (chronic obstructive pulmonary disease) 08/06/2012  . Tobacco abuse 08/06/2012  . Memory difficulties 08/06/2012  . Hyperlipidemia 08/06/2012  . CVA (cerebral infarction) 08/05/2012  . HTN (hypertension) 08/05/2012  . CONSTIPATION 05/14/2009  . CHANGE IN BOWELS 05/14/2009  . COLONIC POLYPS, HX OF 05/14/2009  . SHOULDER, ARTHRITIS, DEGEN./OSTEO 03/14/2008  . SHOULDER PAIN 03/14/2008  . IMPINGEMENT SYNDROME 03/14/2008  . RUPTURE ROTATOR CUFF 03/14/2008   SLP - End of Session Activity Tolerance: Patient tolerated treatment well General Behavior During Therapy: WFL for tasks assessed/performed  SLP Assessment/Plan Clinical Impression Statement: Mr. Goodbar proudly presented his homework including a math assignment and two word finding/writing assignments. The clinician noted 3 errors on the word finding activities  and 3 errors on the math activity. Mr. Markell reported that the math is difficult but that he  knows he needs to "challenge himself". When presented with an activity that required him to calculate change, he was able to do so with 100% accuracy independently, but when asked to provide the correct change for a purchase when given a 20 dollar bill, he had great difficulty and required max verbal and visual cues. He reluctantly accompanied the clinician to the gift shop and told the cashier what change he needed back when purchasing candy; he required max verbal cues for this activity. Continue training in money management and math skills to facilitate increased independence.  Speech Therapy Frequency: min 2x/week Duration: 4 weeks Treatment/Interventions: Language facilitation;Cueing hierarchy;Patient/family education;Compensatory strategies;SLP instruction and feedback Potential to Achieve Goals: Good Potential Considerations: Cooperation/participation level  Thank you, Amaryllis Dyke, [place]      Escarlet Saathoff 03/15/2014, 12:23 PM

## 2014-03-15 NOTE — Progress Notes (Signed)
Reviewed and in agreement with treatment provided  Thank you,  Genene Churn, [place]

## 2014-03-20 ENCOUNTER — Ambulatory Visit (HOSPITAL_COMMUNITY)
Admission: RE | Admit: 2014-03-20 | Discharge: 2014-03-20 | Disposition: A | Discharge status: Home / Self Care | Payer: Medicare Other | Source: Ambulatory Visit | Attending: Internal Medicine | Admitting: Internal Medicine

## 2014-03-20 DIAGNOSIS — I69920 Aphasia following unspecified cerebrovascular disease: Secondary | ICD-10-CM

## 2014-03-20 NOTE — Progress Notes (Signed)
Speech Language Pathology Treatment Patient Details  Name: Glenn [name] MRN: [id number] Date of Birth: 05/01/1941  Today's Date: 03/20/2014 Time: [id number] SLP Time Calculation (min): 49 min  Authorization: Medicare  Authorization Time Period: 03/02/2015- 03/29/2014  Authorization Visit#:  14 of 16   HPI:  Symptoms/Limitations Symptoms: "I want to be able to do everything... Everything I've always done" Pain Assessment Currently in Pain?: No/denies  Treatment  Aphasia Therapy Patient/Family Education Home Exercise Program  SLP Goals  Home Exercise SLP Goal: Patient will Perform Home Exercise Program: with supervision, verbal cues required/provided SLP Goal: Perform Home Exercise Program - Progress: Progressing toward goal SLP Short Term Goals SLP Short Term Goal 1: Pt will increase divergent naming to 10+ items for concrete categories with min cues. SLP Short Term Goal 1 - Progress: Progressing toward goal SLP Short Term Goal 2: Pt will increase divergent naming to 6+ items for abstract categories with mild/mod cues. SLP Short Term Goal 2 - Progress: Progressing toward goal SLP Short Term Goal 3: Pt will increase reading comprehension to 90% acc for short paragraph-length material with multiple choice responses. SLP Short Term Goal 3 - Progress: Progressing toward goal SLP Short Term Goal 4: Pt will increase written expression for personal/bio info to 100% acc with mi/mod cues provided. SLP Short Term Goal 4 - Progress: Progressing toward goal SLP Short Term Goal 5: Pt will self correct paraphasic speech errors in conversational speech 90% of the time with min cues and use of fluency enhancing strategies. SLP Short Term Goal 5 - Progress: Progressing toward goal Additional SLP Short Term Goals?: Yes SLP Short Term Goal 6: Pt will complete basic level calculations (add, subtract, multiply, divide) with 90% acc and mod assist. SLP Short Term Goal 6 - Progress: Progressing toward  goal SLP Long Term Goals SLP Long Term Goal 1: Pt will increase verbal expression to [facility] for moderately complex conversations in home and community environment with use of strategies. SLP Long Term Goal 1 - Progress: Progressing toward goal SLP Long Term Goal 2: Pt will increase reading comprehension to [facility] for paragraph-length material  with use of strategies. SLP Long Term Goal 2 - Progress: Progressing toward goal SLP Long Term Goal 3: Pt will increase written expression to [facility] for personal/bio and short sentences with use of strategies. SLP Long Term Goal 3 - Progress: Progressing toward goal  Assessment/Plan  Patient Active Problem List   Diagnosis Date Noted  . Impairment of visual perception 12/19/2013  . Lack of coordination 12/19/2013  . Muscle weakness (generalized) 12/19/2013  . Aphasia following cerebrovascular disease 12/11/2013  . Acute ischemic stroke 11/03/2013  . Acute renal failure 11/03/2013  . COPD (chronic obstructive pulmonary disease) 08/06/2012  . Tobacco abuse 08/06/2012  . Memory difficulties 08/06/2012  . Hyperlipidemia 08/06/2012  . CVA (cerebral infarction) 08/05/2012  . HTN (hypertension) 08/05/2012  . CONSTIPATION 05/14/2009  . CHANGE IN BOWELS 05/14/2009  . COLONIC POLYPS, HX OF 05/14/2009  . SHOULDER, ARTHRITIS, DEGEN./OSTEO 03/14/2008  . SHOULDER PAIN 03/14/2008  . IMPINGEMENT SYNDROME 03/14/2008  . RUPTURE ROTATOR CUFF 03/14/2008   SLP - End of Session Activity Tolerance: Patient tolerated treatment well General Behavior During Therapy: [facility] for tasks assessed/performed  SLP Assessment/Plan Clinical Impression Statement: The clinician reviewed Glenn [name]' homework containing one simple math assignment and one word finding/writing assignment, both of which he completed with 100% accuracy. He reported that his son checked his work and he had to redo "3 or 4". When presented with math  problems involving money, he had great difficulty doing the  unrounded numbers in his head; After being given writen and visual cues Glenn [name] wrote out his work and completed the problem in ~5 seconds. He verbalized that he "hates" having to write it out but understands that he has to. He communicated the emotional strain and frustration of not being able to "get the words out" and "hold a conversation". The clinician suggested the brain injury support group and discussed functional plans to address this in therapy. Plan to walk around the hospital or go to a store (targeting Glenn [name] talking with strangers) while continuing to target math in upcoming sessions.   Speech Therapy Frequency: min 2x/week Duration: 4 weeks Treatment/Interventions: Language facilitation;Cueing hierarchy;Patient/family education;Compensatory strategies;SLP instruction and feedback Potential to Achieve Goals: Good Potential Considerations: Cooperation/participation level  Thank you, [facility], SLP-Student [phone]     Brystol Wasilewski 03/20/2014, 1:28 PM

## 2014-03-20 NOTE — Progress Notes (Signed)
Reviewed and in agreement with treatment provided  Thank you,  Genene Churn, [place]

## 2014-03-22 ENCOUNTER — Ambulatory Visit (HOSPITAL_COMMUNITY)
Admission: RE | Admit: 2014-03-22 | Discharge: 2014-03-22 | Disposition: A | Discharge status: Home / Self Care | Payer: Medicare Other | Source: Ambulatory Visit | Attending: Internal Medicine | Admitting: Internal Medicine

## 2014-03-22 DIAGNOSIS — I69920 Aphasia following unspecified cerebrovascular disease: Secondary | ICD-10-CM

## 2014-03-22 NOTE — Progress Notes (Signed)
Reviewed and in agreement with treatment provided  Thank you,  Genene Churn, [place]

## 2014-03-22 NOTE — Progress Notes (Signed)
Speech Language Pathology Treatment Patient Details  Name: Glenn [name] MRN: [id number] Date of Birth: [birth year]  Today's Date: 03/22/2014 Time: 1030-1115 SLP Time Calculation (min): 45 min  Authorization: Medicare  Authorization Time Period: 03/02/2015- 03/29/2014  Authorization Visit#:  15 of 16   HPI:  Symptoms/Limitations Symptoms: "I want to challenge myself; I am challenging myself"    Treatment  Aphasia Therapy Patient/Family Education Home Exercise Program  SLP Goals  Home Exercise SLP Goal: Patient will Perform Home Exercise Program: with supervision, verbal cues required/provided SLP Goal: Perform Home Exercise Program - Progress: Progressing toward goal SLP Short Term Goals SLP Short Term Goal 1: Pt will increase divergent naming to 10+ items for concrete categories with min cues. SLP Short Term Goal 1 - Progress: Progressing toward goal SLP Short Term Goal 2: Pt will increase divergent naming to 6+ items for abstract categories with mild/mod cues. SLP Short Term Goal 2 - Progress: Progressing toward goal SLP Short Term Goal 3: Pt will increase reading comprehension to 90% acc for short paragraph-length material with multiple choice responses. SLP Short Term Goal 3 - Progress: Progressing toward goal SLP Short Term Goal 4: Pt will increase written expression for personal/bio info to 100% acc with mi/mod cues provided. SLP Short Term Goal 4 - Progress: Progressing toward goal SLP Short Term Goal 5: Pt will self correct paraphasic speech errors in conversational speech 90% of the time with min cues and use of fluency enhancing strategies. SLP Short Term Goal 5 - Progress: Progressing toward goal Additional SLP Short Term Goals?: Yes SLP Short Term Goal 6: Pt will complete basic level calculations (add, subtract, multiply, divide) with 90% acc and mod assist. SLP Short Term Goal 6 - Progress: Progressing toward goal SLP Long Term Goals SLP Long Term Goal 1: Pt will  increase verbal expression to [facility] for moderately complex conversations in home and community environment with use of strategies. SLP Long Term Goal 1 - Progress: Progressing toward goal SLP Long Term Goal 2: Pt will increase reading comprehension to [facility] for paragraph-length material  with use of strategies. SLP Long Term Goal 2 - Progress: Progressing toward goal SLP Long Term Goal 3: Pt will increase written expression to [facility] for personal/bio and short sentences with use of strategies. SLP Long Term Goal 3 - Progress: Progressing toward goal  Assessment/Plan  Patient Active Problem List   Diagnosis Date Noted  . Impairment of visual perception 12/19/2013  . Lack of coordination 12/19/2013  . Muscle weakness (generalized) 12/19/2013  . Aphasia following cerebrovascular disease 12/11/2013  . Acute ischemic stroke 11/03/2013  . Acute renal failure 11/03/2013  . COPD (chronic obstructive pulmonary disease) 08/06/2012  . Tobacco abuse 08/06/2012  . Memory difficulties 08/06/2012  . Hyperlipidemia 08/06/2012  . CVA (cerebral infarction) 08/05/2012  . HTN (hypertension) 08/05/2012  . CONSTIPATION 05/14/2009  . CHANGE IN BOWELS 05/14/2009  . COLONIC POLYPS, HX OF 05/14/2009  . SHOULDER, ARTHRITIS, DEGEN./OSTEO 03/14/2008  . SHOULDER PAIN 03/14/2008  . IMPINGEMENT SYNDROME 03/14/2008  . RUPTURE ROTATOR CUFF 03/14/2008   SLP - End of Session Activity Tolerance: Patient tolerated treatment well General Behavior During Therapy: WFL for tasks assessed/performed  SLP Assessment/Plan Clinical Impression Statement: Mr. Glenn [name] returned his homework reporting that the math assignment containing percentages were very difficult for him and that the assignment requiring him to generate complete sentences was also difficult although he generated complete grammatically correct sentences with 75% acc.  He was able to complete 3 problems in which he  calculated the cost of an item when given the ammount he  gave the cashier and his change amount. He completed one of the calculations mentally independently. He required cueing to write down the other problems in order to work them out step-by-step because he was unable to do so "in his head". He was asked to make a phone call but reportedly was "not comfortable" to do so, but after calling the clincian on the phone and producing 80% fluent speech, he willingly called a local restaurant to ask the hours. He produced 100% fluent speech in the second phone call. Plan to facilitate situations for Mr. Glenn [name] to talk with strangers and make phone calls in upcoming session.  Speech Therapy Frequency: min 2x/week Duration: 4 weeks Treatment/Interventions: Language facilitation;Cueing hierarchy;Patient/family education;Compensatory strategies;SLP instruction and feedback Potential to Achieve Goals: Good Potential Considerations: Cooperation/participation level  Thank you, Amaryllis Dyke, SLP-Student [phone]     Palmira Stickle 03/22/2014, 11:49 AM

## 2014-03-27 ENCOUNTER — Ambulatory Visit (HOSPITAL_COMMUNITY)
Admission: RE | Admit: 2014-03-27 | Discharge: 2014-03-27 | Disposition: A | Discharge status: Home / Self Care | Payer: Medicare Other | Source: Ambulatory Visit | Attending: Internal Medicine | Admitting: Internal Medicine

## 2014-03-27 DIAGNOSIS — I69920 Aphasia following unspecified cerebrovascular disease: Secondary | ICD-10-CM

## 2014-03-27 NOTE — Progress Notes (Signed)
Speech Language Pathology Treatment/ Progress Note Patient Details  Name: Glenn [name] MRN: [id number] Date of Birth: [birth year]  Today's Date: 03/27/2014 Time: 1030-1116 SLP Time Calculation (min): 46 min  Authorization: Medicare  Authorization Time Period: 03/02/2015- 03/29/2014  Authorization Visit#:  16 of 16   HPI:  Symptoms/Limitations Symptoms: "I think I am better"     Treatment  Aphasia Therapy Patient/Family Education Home Exercise Program  SLP Goals  Home Exercise SLP Goal: Patient will Perform Home Exercise Program: with supervision, verbal cues required/provided SLP Goal: Perform Home Exercise Program - Progress: Progressing toward goal SLP Short Term Goals SLP Short Term Goal 1: Pt will increase divergent naming to 10+ items for concrete categories with min cues. SLP Short Term Goal 1 - Progress: Met SLP Short Term Goal 2: Pt will increase divergent naming to 6+ items for abstract categories with mild/mod cues. SLP Short Term Goal 2 - Progress: Progressing toward goal SLP Short Term Goal 3: Pt will increase reading comprehension to 90% acc for short paragraph-length material with multiple choice responses. SLP Short Term Goal 3 - Progress: Met SLP Short Term Goal 4: Pt will increase written expression for personal/bio info to 100% acc with mi/mod cues provided. SLP Short Term Goal 4 - Progress: Progressing toward goal SLP Short Term Goal 5: Pt will self correct paraphasic speech errors in conversational speech 90% of the time with min cues and use of fluency enhancing strategies. SLP Short Term Goal 5 - Progress: Met Additional SLP Short Term Goals?: Yes SLP Short Term Goal 6: Pt will complete basic level calculations (add, subtract, multiply, divide) with 90% acc and mod assist. SLP Short Term Goal 6 - Progress: Progressing toward goal SLP Long Term Goals SLP Long Term Goal 1: Pt will increase verbal expression to [facility] for moderately complex conversations in home  and community environment with use of strategies. SLP Long Term Goal 1 - Progress: Progressing toward goal SLP Long Term Goal 2: Pt will increase reading comprehension to [facility] for paragraph-length material  with use of strategies. SLP Long Term Goal 2 - Progress: Progressing toward goal SLP Long Term Goal 3: Pt will increase written expression to [facility] for personal/bio and short sentences with use of strategies.  New Goals Pt will complete basic change making calculations with 80% accuracy with mod cues and using strategies Pt will effectively communicate moderate level wants/needs with novel communication partners in the community with min verbal cues after 1:1 rehearsal with clinician Pt will type targeted words with 80% accuracy with min cues   Assessment/Plan  Patient Active Problem List   Diagnosis Date Noted  . Impairment of visual perception 12/19/2013  . Lack of coordination 12/19/2013  . Muscle weakness (generalized) 12/19/2013  . Aphasia following cerebrovascular disease 12/11/2013  . Acute ischemic stroke 11/03/2013  . Acute renal failure 11/03/2013  . COPD (chronic obstructive pulmonary disease) 08/06/2012  . Tobacco abuse 08/06/2012  . Memory difficulties 08/06/2012  . Hyperlipidemia 08/06/2012  . CVA (cerebral infarction) 08/05/2012  . HTN (hypertension) 08/05/2012  . CONSTIPATION 05/14/2009  . CHANGE IN BOWELS 05/14/2009  . COLONIC POLYPS, HX OF 05/14/2009  . SHOULDER, ARTHRITIS, DEGEN./OSTEO 03/14/2008  . SHOULDER PAIN 03/14/2008  . IMPINGEMENT SYNDROME 03/14/2008  . RUPTURE ROTATOR CUFF 03/14/2008   SLP - End of Session Activity Tolerance: Patient tolerated treatment well General Behavior During Therapy: WFL for tasks assessed/performed  SLP Assessment/Plan Clinical Impression Statement: [name] presented his 2 homework assignments and reportedly used a calculator for all  the math/money basic calculations assigned because it was "much easier" to complete this  way; he completed his assigned sentence completion written task with 85% accuracy and was able to self correct errors when given mod cues while reading through the sentences.  After discussing his desire to continue therapy and working on a script with the clinician, [name] talked to the receptionist in order to schedule upcoming appointments; his initial attempt was broken and somewhat scattered, but he regained his composure when given min cues and requested to make appointments with 100% fluency on the second attempt. Continue providing opportunities for [name] to converse with people outside of therapy and with current plan of care.  Speech Therapy Frequency: min 2x/week Duration: 4 weeks Treatment/Interventions: Language facilitation;Cueing hierarchy;Patient/family education;Compensatory strategies;SLP instruction and feedback Potential to Achieve Goals: Good Potential Considerations: Cooperation/participation level  Thank you, Amaryllis Dyke, SLP-Student [phone]     Indira Sorenson 03/27/2014, 1:41 PM

## 2014-03-27 NOTE — Progress Notes (Signed)
Reviewed and in agreement with treatment provided  Thank you,  Genene Churn, [place]

## 2014-03-29 ENCOUNTER — Ambulatory Visit (HOSPITAL_COMMUNITY): Payer: Self-pay | Admitting: Speech Pathology

## 2014-03-29 ENCOUNTER — Telehealth (HOSPITAL_COMMUNITY): Payer: Self-pay

## 2014-04-10 ENCOUNTER — Ambulatory Visit (HOSPITAL_COMMUNITY)
Admission: RE | Admit: 2014-04-10 | Discharge: 2014-04-10 | Disposition: A | Discharge status: Home / Self Care | Payer: Medicare Other | Source: Ambulatory Visit | Attending: Internal Medicine | Admitting: Internal Medicine

## 2014-04-10 DIAGNOSIS — J4489 Other specified chronic obstructive pulmonary disease: Secondary | ICD-10-CM | POA: Insufficient documentation

## 2014-04-10 DIAGNOSIS — I69920 Aphasia following unspecified cerebrovascular disease: Secondary | ICD-10-CM | POA: Insufficient documentation

## 2014-04-10 DIAGNOSIS — J449 Chronic obstructive pulmonary disease, unspecified: Secondary | ICD-10-CM | POA: Diagnosis not present

## 2014-04-10 DIAGNOSIS — IMO0001 Reserved for inherently not codable concepts without codable children: Secondary | ICD-10-CM | POA: Diagnosis not present

## 2014-04-10 DIAGNOSIS — I10 Essential (primary) hypertension: Secondary | ICD-10-CM | POA: Diagnosis not present

## 2014-04-10 NOTE — Progress Notes (Signed)
Reviewed and in agreement with treatment provided  Thank you,  Genene Churn, [place]

## 2014-04-10 NOTE — Progress Notes (Signed)
Speech Language Pathology Treatment Patient Details  Name: Glenn [name] MRN: [id number] Date of Birth: 26-May-1941  Today's Date: 04/10/2014 Time: 1030-1115 SLP Time Calculation (min): 45 min  Authorization: Medicare  Authorization Time Period: 03/02/2015- 03/29/2014  Authorization Visit#:  36 of 24   HPI:  Symptoms/Limitations Symptoms: "I need to challenge myself and make myself do things" Pain Assessment Currently in Pain?: No/denies   Treatment  Aphasia Therapy Patient/Family Education Home Exercise Program  SLP Goals  Home Exercise SLP Goal: Patient will Perform Home Exercise Program: with supervision, verbal cues required/provided SLP Goal: Perform Home Exercise Program - Progress: Progressing toward goal SLP Short Term Goals SLP Short Term Goal 1: Pt will increase divergent naming to 10+ items for concrete categories with min cues. SLP Short Term Goal 1 - Progress: Met SLP Short Term Goal 2: Pt will increase divergent naming to 6+ items for abstract categories with mild/mod cues. SLP Short Term Goal 2 - Progress: Progressing toward goal SLP Short Term Goal 3: Pt will increase reading comprehension to 90% acc for short paragraph-length material with multiple choice responses. SLP Short Term Goal 3 - Progress: Met SLP Short Term Goal 4: Pt will increase written expression for personal/bio info to 100% acc with mi/mod cues provided. SLP Short Term Goal 4 - Progress: Progressing toward goal SLP Short Term Goal 5: Pt will self correct paraphasic speech errors in conversational speech 90% of the time with min cues and use of fluency enhancing strategies. SLP Short Term Goal 5 - Progress: Met Additional SLP Short Term Goals?: Yes SLP Short Term Goal 6: Pt will complete basic level calculations (add, subtract, multiply, divide) with 90% acc and mod assist. SLP Short Term Goal 6 - Progress: Progressing toward goal SLP Short Term Goal 7: Pt will complete basic change making  calculations with 80% accuracy with mod cues and using strategies SLP Short Term Goal 7 - Progress: Progressing toward goal SLP Short Term Goal 8: Pt will effectively communicate moderate level wants/needs with novel communication partners in the community with min verbal cues after 1:1 rehearsal with clinician  SLP Short Term Goal 8 - Progress: Progressing toward goal SLP Short Term Goal 9: Pt will type targeted words with 80% accuracy with min cues  SLP Short Term Goal 9 - Progress: Progressing toward goal SLP Long Term Goals SLP Long Term Goal 1: Pt will increase verbal expression to [facility] for moderately complex conversations in home and community environment with use of strategies. SLP Long Term Goal 1 - Progress: Progressing toward goal SLP Long Term Goal 2: Pt will increase reading comprehension to [facility] for paragraph-length material  with use of strategies. SLP Long Term Goal 2 - Progress: Progressing toward goal SLP Long Term Goal 3: Pt will increase written expression to [facility] for personal/bio and short sentences with use of strategies. SLP Long Term Goal 3 - Progress: Progressing toward goal  Assessment/Plan  Patient Active Problem List   Diagnosis Date Noted  . Impairment of visual perception 12/19/2013  . Lack of coordination 12/19/2013  . Muscle weakness (generalized) 12/19/2013  . Aphasia following cerebrovascular disease 12/11/2013  . Acute ischemic stroke 11/03/2013  . Acute renal failure 11/03/2013  . COPD (chronic obstructive pulmonary disease) 08/06/2012  . Tobacco abuse 08/06/2012  . Memory difficulties 08/06/2012  . Hyperlipidemia 08/06/2012  . CVA (cerebral infarction) 08/05/2012  . HTN (hypertension) 08/05/2012  . CONSTIPATION 05/14/2009  . CHANGE IN BOWELS 05/14/2009  . COLONIC POLYPS, HX OF 05/14/2009  . SHOULDER,  ARTHRITIS, DEGEN./OSTEO 03/14/2008  . SHOULDER PAIN 03/14/2008  . IMPINGEMENT SYNDROME 03/14/2008  . RUPTURE ROTATOR CUFF 03/14/2008   SLP - End of  Session Activity Tolerance: Patient tolerated treatment well General Behavior During Therapy: WFL for tasks assessed/performed  SLP Assessment/Plan Clinical Impression Statement: Upon reviewing Glenn [name]' homework, the clinician noted that although he corrected the senteneces on one of the assignments, he did not follow the directions correctly likely due to impulsivity; the clinician reviewed the asssignment with him and he completed it with 80% accuracy. He wrote a story for another home work assignment with 100% accuracy. He had great difficulty calculating the change for a targeted amount even when given max verbal and visual cues from the clinician. He verbalized his frustration and desire to be able to do simple calculations in his head. The clinician educated him of a new strategy to subtract the dollar amount prior to calculating the change. This strategy will be attempted in upcoming sessions to aid in speed for mental calculations. Glenn [name] called a local business and talked to a novel communication partner and effectively communicated his question/need fluently. Continue with current plan of care.  Speech Therapy Frequency: min 2x/week Duration: 4 weeks Treatment/Interventions: Language facilitation;Cueing hierarchy;Patient/family education;Compensatory strategies;SLP instruction and feedback Potential to Achieve Goals: Good Potential Considerations: Cooperation/participation level  GN    Thank you, Amaryllis Dyke, [place] 04/10/2014, 1:59 PM

## 2014-04-12 ENCOUNTER — Ambulatory Visit (HOSPITAL_COMMUNITY)
Admission: RE | Admit: 2014-04-12 | Discharge: 2014-04-12 | Disposition: A | Discharge status: Home / Self Care | Payer: Medicare Other | Source: Ambulatory Visit | Attending: Internal Medicine | Admitting: Internal Medicine

## 2014-04-12 DIAGNOSIS — I69920 Aphasia following unspecified cerebrovascular disease: Secondary | ICD-10-CM

## 2014-04-12 NOTE — Progress Notes (Signed)
Speech Language Pathology Treatment Patient Details  Name: Glenn [name] MRN: [id number] Date of Birth: [birth year]  Today's Date: 04/12/2014 Time: 3244-0102 SLP Time Calculation (min): 45 min  Authorization: Medicare  Authorization Time Period: 03/02/2015- 03/29/2014  Authorization Visit#:  34 of 24   HPI:  Symptoms/Limitations Symptoms: "I get so frustrated when I make mistakes" Pain Assessment Currently in Pain?: No/denies   Treatment  Aphasia Therapy Patient/Family Education Home Exercise Program  SLP Goals  Home Exercise SLP Goal: Patient will Perform Home Exercise Program: with supervision, verbal cues required/provided SLP Goal: Perform Home Exercise Program - Progress: Progressing toward goal SLP Short Term Goals SLP Short Term Goal 1: Pt will increase divergent naming to 10+ items for concrete categories with min cues. SLP Short Term Goal 1 - Progress: Met SLP Short Term Goal 2: Pt will increase divergent naming to 6+ items for abstract categories with mild/mod cues. SLP Short Term Goal 2 - Progress: Progressing toward goal SLP Short Term Goal 3: Pt will increase reading comprehension to 90% acc for short paragraph-length material with multiple choice responses. SLP Short Term Goal 3 - Progress: Met SLP Short Term Goal 4: Pt will increase written expression for personal/bio info to 100% acc with mi/mod cues provided. SLP Short Term Goal 4 - Progress: Progressing toward goal SLP Short Term Goal 5: Pt will self correct paraphasic speech errors in conversational speech 90% of the time with min cues and use of fluency enhancing strategies. SLP Short Term Goal 5 - Progress: Met Additional SLP Short Term Goals?: Yes SLP Short Term Goal 6: Pt will complete basic level calculations (add, subtract, multiply, divide) with 90% acc and mod assist. SLP Short Term Goal 7: Pt will complete basic change making calculations with 80% accuracy with mod cues and using strategies SLP Short  Term Goal 7 - Progress: Progressing toward goal SLP Short Term Goal 8: Pt will effectively communicate moderate level wants/needs with novel communication partners in the community with min verbal cues after 1:1 rehearsal with clinician  SLP Short Term Goal 9: Pt will type targeted words with 80% accuracy with min cues  SLP Short Term Goal 9 - Progress: Progressing toward goal SLP Long Term Goals SLP Long Term Goal 1: Pt will increase verbal expression to [facility] for moderately complex conversations in home and community environment with use of strategies. SLP Long Term Goal 1 - Progress: Progressing toward goal SLP Long Term Goal 2: Pt will increase reading comprehension to [facility] for paragraph-length material  with use of strategies. SLP Long Term Goal 2 - Progress: Progressing toward goal SLP Long Term Goal 3: Pt will increase written expression to [facility] for personal/bio and short sentences with use of strategies. SLP Long Term Goal 3 - Progress: Progressing toward goal  Assessment/Plan  Patient Active Problem List   Diagnosis Date Noted  . Impairment of visual perception 12/19/2013  . Lack of coordination 12/19/2013  . Muscle weakness (generalized) 12/19/2013  . Aphasia following cerebrovascular disease 12/11/2013  . Acute ischemic stroke 11/03/2013  . Acute renal failure 11/03/2013  . COPD (chronic obstructive pulmonary disease) 08/06/2012  . Tobacco abuse 08/06/2012  . Memory difficulties 08/06/2012  . Hyperlipidemia 08/06/2012  . CVA (cerebral infarction) 08/05/2012  . HTN (hypertension) 08/05/2012  . CONSTIPATION 05/14/2009  . CHANGE IN BOWELS 05/14/2009  . COLONIC POLYPS, HX OF 05/14/2009  . SHOULDER, ARTHRITIS, DEGEN./OSTEO 03/14/2008  . SHOULDER PAIN 03/14/2008  . IMPINGEMENT SYNDROME 03/14/2008  . RUPTURE ROTATOR CUFF 03/14/2008   SLP -  End of Session Activity Tolerance: Patient tolerated treatment well General Behavior During Therapy: WFL for tasks assessed/performed  SLP  Assessment/Plan Clinical Impression Statement: [name] returned his 2 homework assignments and completed the math assignemnt with 95% accuracy and the writtent assignment with 100% accuracy.  [name] was given 3 word problems where he calculated the change he would be given and required mod cues for all three. When presented with fake money, [name] was asked to present targeted amounts of money. On the first trial he presented the clinician with 20$ less than requested, but on the other 4 trials, he presented the correct ammount independently and self corrected two change errors.  His speed in collecting the correct amount of money increased with each attempt. [name] was presented with a computer and asked to type targeted words which he did with 100% accuracy; he self corrected 4 mis-typed letters. Continue providing opportunities for [name] to call/talk with novel communication partners in the community and provide opportunities for him to type targeted words.  Speech Therapy Frequency: min 2x/week Duration: 4 weeks Treatment/Interventions: Language facilitation;Cueing hierarchy;Patient/family education;Compensatory strategies;SLP instruction and feedback Potential to Achieve Goals: Good Potential Considerations: Cooperation/participation level  GN   Thank you, Amaryllis Dyke, [place] 04/12/2014, 1:02 PM

## 2014-04-17 ENCOUNTER — Ambulatory Visit (HOSPITAL_COMMUNITY)
Admission: RE | Admit: 2014-04-17 | Discharge: 2014-04-17 | Disposition: A | Discharge status: Home / Self Care | Payer: Medicare Other | Source: Ambulatory Visit | Attending: Internal Medicine | Admitting: Internal Medicine

## 2014-04-17 DIAGNOSIS — I69920 Aphasia following unspecified cerebrovascular disease: Secondary | ICD-10-CM

## 2014-04-17 NOTE — Progress Notes (Signed)
Speech Language Pathology Treatment Patient Details  Name: Glenn [name] MRN: [id number] Date of Birth: 12/27/1941  Today's Date: 04/17/2014 Time: 1015-1102 SLP Time Calculation (min): 47 min  Authorization: Medicare  Authorization Time Period:  03/29/2014- 04/26/2014  Authorization Visit#:  47 of 24   HPI:  Symptoms/Limitations Symptoms: "I feel like I am really doing better." Pain Assessment Currently in Pain?: No/denies    Treatment  Aphasia Therapy Patient/Family Education Home Exercise Program  SLP Goals  Home Exercise SLP Goal: Patient will Perform Home Exercise Program: with supervision, verbal cues required/provided SLP Goal: Perform Home Exercise Program - Progress: Progressing toward goal SLP Short Term Goals SLP Short Term Goal 1: Pt will increase divergent naming to 10+ items for concrete categories with min cues. SLP Short Term Goal 1 - Progress: Met SLP Short Term Goal 2: Pt will increase divergent naming to 6+ items for abstract categories with mild/mod cues. SLP Short Term Goal 2 - Progress: Progressing toward goal SLP Short Term Goal 3: Pt will increase reading comprehension to 90% acc for short paragraph-length material with multiple choice responses. SLP Short Term Goal 3 - Progress: Met SLP Short Term Goal 4: Pt will increase written expression for personal/bio info to 100% acc with mi/mod cues provided. SLP Short Term Goal 4 - Progress: Progressing toward goal SLP Short Term Goal 5: Pt will self correct paraphasic speech errors in conversational speech 90% of the time with min cues and use of fluency enhancing strategies. SLP Short Term Goal 5 - Progress: Met Additional SLP Short Term Goals?: Yes SLP Short Term Goal 6: Pt will complete basic level calculations (add, subtract, multiply, divide) with 90% acc and mod assist. SLP Short Term Goal 6 - Progress: Progressing toward goal SLP Short Term Goal 7: Pt will complete basic change making calculations with  80% accuracy with mod cues and using strategies SLP Short Term Goal 7 - Progress: Progressing toward goal SLP Short Term Goal 8: Pt will effectively communicate moderate level wants/needs with novel communication partners in the community with min verbal cues after 1:1 rehearsal with clinician  SLP Short Term Goal 8 - Progress: Progressing toward goal SLP Short Term Goal 9: Pt will type targeted words with 80% accuracy with min cues  SLP Short Term Goal 9 - Progress: Progressing toward goal SLP Long Term Goals SLP Long Term Goal 1: Pt will increase verbal expression to [facility] for moderately complex conversations in home and community environment with use of strategies. SLP Long Term Goal 1 - Progress: Progressing toward goal SLP Long Term Goal 2: Pt will increase reading comprehension to [facility] for paragraph-length material  with use of strategies. SLP Long Term Goal 2 - Progress: Progressing toward goal SLP Long Term Goal 3: Pt will increase written expression to [facility] for personal/bio and short sentences with use of strategies. SLP Long Term Goal 3 - Progress: Progressing toward goal  Assessment/Plan  Patient Active Problem List   Diagnosis Date Noted  . Impairment of visual perception 12/19/2013  . Lack of coordination 12/19/2013  . Muscle weakness (generalized) 12/19/2013  . Aphasia following cerebrovascular disease 12/11/2013  . Acute ischemic stroke 11/03/2013  . Acute renal failure 11/03/2013  . COPD (chronic obstructive pulmonary disease) 08/06/2012  . Tobacco abuse 08/06/2012  . Memory difficulties 08/06/2012  . Hyperlipidemia 08/06/2012  . CVA (cerebral infarction) 08/05/2012  . HTN (hypertension) 08/05/2012  . CONSTIPATION 05/14/2009  . CHANGE IN BOWELS 05/14/2009  . COLONIC POLYPS, HX OF 05/14/2009  . SHOULDER,  ARTHRITIS, DEGEN./OSTEO 03/14/2008  . SHOULDER PAIN 03/14/2008  . IMPINGEMENT SYNDROME 03/14/2008  . RUPTURE ROTATOR CUFF 03/14/2008   SLP - End of Session Activity  Tolerance: Patient tolerated treatment well General Behavior During Therapy: WFL for tasks assessed/performed  SLP Assessment/Plan Clinical Impression Statement: Mr. Yaworski was excited to review his homework today. He did an excellent job of using two unrelated words in a sentence. He misinterpreted the directions on one sheet and instead of writing questions, he wrote answers. In session, he was given a topic (ie. birthday, hobbies, weather, name, favorite foods) and asked to generate a question (starting with Who, What, When, Where, Why, or How) verbally to clinician. He needed frequent cues to start his question with the above (Wh), but once clinician pointed to the words he was able to generate an appropriate question. He reminded himself to "slow down, [place]" when making paraphasic errors. He was given additional homework and encouraged to get out in the community more (grocery store, out to eat) as he says that he feels more comfortable. Plan to discharge after next session.  Speech Therapy Frequency: min 2x/week Duration: 4 weeks Treatment/Interventions: Language facilitation;Cueing hierarchy;Patient/family education;Compensatory strategies;SLP instruction and feedback Potential to Achieve Goals: Good Potential Considerations: Cooperation/participation level  GN    Ephraim Hamburger 04/17/2014, 12:03 PM

## 2014-04-19 ENCOUNTER — Ambulatory Visit (HOSPITAL_COMMUNITY): Payer: Self-pay | Admitting: Speech Pathology

## 2014-04-24 ENCOUNTER — Ambulatory Visit (HOSPITAL_COMMUNITY): Payer: Self-pay | Admitting: Speech Pathology

## 2014-04-24 DIAGNOSIS — Z125 Encounter for screening for malignant neoplasm of prostate: Secondary | ICD-10-CM | POA: Diagnosis not present

## 2014-04-24 DIAGNOSIS — IMO0002 Reserved for concepts with insufficient information to code with codable children: Secondary | ICD-10-CM | POA: Diagnosis not present

## 2014-04-24 DIAGNOSIS — Z23 Encounter for immunization: Secondary | ICD-10-CM | POA: Diagnosis not present

## 2014-04-24 DIAGNOSIS — G8929 Other chronic pain: Secondary | ICD-10-CM | POA: Diagnosis not present

## 2014-04-24 DIAGNOSIS — I10 Essential (primary) hypertension: Secondary | ICD-10-CM | POA: Diagnosis not present

## 2014-04-24 DIAGNOSIS — Z79899 Other long term (current) drug therapy: Secondary | ICD-10-CM | POA: Diagnosis not present

## 2014-04-26 ENCOUNTER — Ambulatory Visit (HOSPITAL_COMMUNITY)
Admission: RE | Admit: 2014-04-26 | Discharge: 2014-04-26 | Disposition: A | Discharge status: Home / Self Care | Payer: Medicare Other | Source: Ambulatory Visit | Attending: Internal Medicine | Admitting: Internal Medicine

## 2014-04-26 DIAGNOSIS — I69920 Aphasia following unspecified cerebrovascular disease: Secondary | ICD-10-CM

## 2014-04-26 NOTE — Progress Notes (Signed)
Speech Language Pathology Treatment/Discharge Summary Patient Details  Name: Glenn [name] MRN: [id number] Date of Birth: [birth year]  Today's Date: 04/26/2014 Time: 1110-1200 SLP Time Calculation (min): 50 min  Authorization: Medicare  Authorization Time Period:  03/29/2014- 04/26/2014  Authorization Visit#:  20 of 24   HPI:  Symptoms/Limitations Symptoms: "Today is my last day!"     Treatment  Aphasia Therapy Patient/Family Education Home Exercise Program   SLP Goals  Home Exercise SLP Goal: Patient will Perform Home Exercise Program: with supervision, verbal cues required/provided SLP Goal: Perform Home Exercise Program - Progress: Met SLP Short Term Goals SLP Short Term Goal 1: Pt will increase divergent naming to 10+ items for concrete categories with min cues. SLP Short Term Goal 1 - Progress: Met SLP Short Term Goal 2: Pt will increase divergent naming to 6+ items for abstract categories with mild/mod cues. SLP Short Term Goal 2 - Progress: Met SLP Short Term Goal 3: Pt will increase reading comprehension to 90% acc for short paragraph-length material with multiple choice responses. SLP Short Term Goal 3 - Progress: Met SLP Short Term Goal 4: Pt will increase written expression for personal/bio info to 100% acc with mi/mod cues provided. SLP Short Term Goal 4 - Progress: Met SLP Short Term Goal 5: Pt will self correct paraphasic speech errors in conversational speech 90% of the time with min cues and use of fluency enhancing strategies. SLP Short Term Goal 5 - Progress: Met Additional SLP Short Term Goals?: Yes SLP Short Term Goal 6: Pt will complete basic level calculations (add, subtract, multiply, divide) with 90% acc and mod assist. SLP Short Term Goal 6 - Progress: Met SLP Short Term Goal 7: Pt will complete basic change making calculations with 80% accuracy with mod cues and using strategies SLP Short Term Goal 7 - Progress: Met SLP Short Term Goal 8: Pt will  effectively communicate moderate level wants/needs with novel communication partners in the community with min verbal cues after 1:1 rehearsal with clinician  SLP Short Term Goal 8 - Progress: Met SLP Short Term Goal 9: Pt will type targeted words with 80% accuracy with min cues  SLP Short Term Goal 9 - Progress: Partly met SLP Long Term Goals SLP Long Term Goal 1: Pt will increase verbal expression to [facility] for moderately complex conversations in home and community environment with use of strategies. SLP Long Term Goal 1 - Progress: Met SLP Long Term Goal 2: Pt will increase reading comprehension to [facility] for paragraph-length material  with use of strategies. SLP Long Term Goal 2 - Progress: Met SLP Long Term Goal 3: Pt will increase written expression to [facility] for personal/bio and short sentences with use of strategies. SLP Long Term Goal 3 - Progress: Met  Assessment/Plan  Patient Active Problem List   Diagnosis Date Noted  . Impairment of visual perception 12/19/2013  . Lack of coordination 12/19/2013  . Muscle weakness (generalized) 12/19/2013  . Aphasia following cerebrovascular disease 12/11/2013  . Acute ischemic stroke 11/03/2013  . Acute renal failure 11/03/2013  . COPD (chronic obstructive pulmonary disease) 08/06/2012  . Tobacco abuse 08/06/2012  . Memory difficulties 08/06/2012  . Hyperlipidemia 08/06/2012  . CVA (cerebral infarction) 08/05/2012  . HTN (hypertension) 08/05/2012  . CONSTIPATION 05/14/2009  . CHANGE IN BOWELS 05/14/2009  . COLONIC POLYPS, HX OF 05/14/2009  . SHOULDER, ARTHRITIS, DEGEN./OSTEO 03/14/2008  . SHOULDER PAIN 03/14/2008  . IMPINGEMENT SYNDROME 03/14/2008  . RUPTURE ROTATOR CUFF 03/14/2008   SLP - End of  Session Activity Tolerance: Patient tolerated treatment well General Behavior During Therapy: WFL for tasks assessed/performed  SLP Assessment/Plan Mr. Edrik Rundle has attended 66 SLP sessions to address speech/language impairments from recent  CVA. He has met all short and long term goals except for one (typing goal) and a home exercises program has been introduced and implemented. Mr. Meister was encouraged to immerse himself in his community a little more in order to increase his confidence with communication. He continues to be self conscious about his speech, but in therapy sessions he met all challenges when presented (making phone calls to request information, scheduling his appointments, purchasing items in the gift shop) with relative ease. He acknowledges that he will need to remember to "slow down" when he has difficulty communicating. Mr. Forse was also encouraged to practice his typing at home by copying printed information onto computer (also to help with spelling). Recommend discharge from speech therapy to home program.  GN Functional Assessment Tool Used: Clinical Judgement Functional Limitations: Spoken language expressive Spoken Language Expression Goal Status [phone]): At least 1 percent but less than 20 percent impaired, limited or restricted Spoken Language Expression Discharge Status [phone]): At least 1 percent but less than 20 percent impaired, limited or restricted  Ephraim Hamburger 04/26/2014, 3:12 PM

## 2014-05-29 ENCOUNTER — Encounter: Payer: Self-pay | Admitting: Internal Medicine

## 2014-08-13 DIAGNOSIS — G8929 Other chronic pain: Secondary | ICD-10-CM | POA: Diagnosis not present

## 2014-08-13 DIAGNOSIS — IMO0002 Reserved for concepts with insufficient information to code with codable children: Secondary | ICD-10-CM | POA: Diagnosis not present

## 2014-08-16 NOTE — Progress Notes (Signed)
Thank you,  Genene Churn, CCC-SLP [phone]

## 2014-11-14 DIAGNOSIS — Z23 Encounter for immunization: Secondary | ICD-10-CM | POA: Diagnosis not present

## 2015-01-15 DIAGNOSIS — Z6822 Body mass index (BMI) 22.0-22.9, adult: Secondary | ICD-10-CM | POA: Diagnosis not present

## 2015-01-15 DIAGNOSIS — G894 Chronic pain syndrome: Secondary | ICD-10-CM | POA: Diagnosis not present

## 2015-01-15 DIAGNOSIS — F5101 Primary insomnia: Secondary | ICD-10-CM | POA: Diagnosis not present

## 2015-03-14 DIAGNOSIS — Z6821 Body mass index (BMI) 21.0-21.9, adult: Secondary | ICD-10-CM | POA: Diagnosis not present

## 2015-03-14 DIAGNOSIS — L02426 Furuncle of left lower limb: Secondary | ICD-10-CM | POA: Diagnosis not present

## 2015-03-14 DIAGNOSIS — G894 Chronic pain syndrome: Secondary | ICD-10-CM | POA: Diagnosis not present

## 2015-03-28 DIAGNOSIS — I10 Essential (primary) hypertension: Secondary | ICD-10-CM | POA: Diagnosis not present

## 2015-03-28 DIAGNOSIS — R634 Abnormal weight loss: Secondary | ICD-10-CM | POA: Diagnosis not present

## 2015-03-28 DIAGNOSIS — J449 Chronic obstructive pulmonary disease, unspecified: Secondary | ICD-10-CM | POA: Diagnosis not present

## 2015-03-28 DIAGNOSIS — Z6823 Body mass index (BMI) 23.0-23.9, adult: Secondary | ICD-10-CM | POA: Diagnosis not present

## 2015-03-28 DIAGNOSIS — R413 Other amnesia: Secondary | ICD-10-CM | POA: Diagnosis not present

## 2015-03-28 DIAGNOSIS — E291 Testicular hypofunction: Secondary | ICD-10-CM | POA: Diagnosis not present

## 2015-03-28 DIAGNOSIS — K219 Gastro-esophageal reflux disease without esophagitis: Secondary | ICD-10-CM | POA: Diagnosis not present

## 2015-04-11 DIAGNOSIS — Z720 Tobacco use: Secondary | ICD-10-CM | POA: Diagnosis not present

## 2015-04-11 DIAGNOSIS — R0602 Shortness of breath: Secondary | ICD-10-CM | POA: Diagnosis not present

## 2015-04-15 DIAGNOSIS — R0602 Shortness of breath: Secondary | ICD-10-CM | POA: Diagnosis not present

## 2015-04-18 DIAGNOSIS — J449 Chronic obstructive pulmonary disease, unspecified: Secondary | ICD-10-CM | POA: Diagnosis not present

## 2015-04-18 DIAGNOSIS — R0602 Shortness of breath: Secondary | ICD-10-CM | POA: Diagnosis not present

## 2015-05-16 DIAGNOSIS — G894 Chronic pain syndrome: Secondary | ICD-10-CM | POA: Diagnosis not present

## 2015-05-16 DIAGNOSIS — Z6823 Body mass index (BMI) 23.0-23.9, adult: Secondary | ICD-10-CM | POA: Diagnosis not present

## 2015-06-10 ENCOUNTER — Encounter: Payer: Self-pay | Admitting: Neurology

## 2015-06-10 ENCOUNTER — Ambulatory Visit (INDEPENDENT_AMBULATORY_CARE_PROVIDER_SITE_OTHER): Payer: Medicare Other | Admitting: Neurology

## 2015-06-10 VITALS — BP 148/66 | HR 60 | Resp 20 | Ht 68.5 in | Wt 151.0 lb

## 2015-06-10 DIAGNOSIS — I63032 Cerebral infarction due to thrombosis of left carotid artery: Secondary | ICD-10-CM

## 2015-06-10 DIAGNOSIS — F11282 Opioid dependence with opioid-induced sleep disorder: Secondary | ICD-10-CM | POA: Insufficient documentation

## 2015-06-10 DIAGNOSIS — J441 Chronic obstructive pulmonary disease with (acute) exacerbation: Secondary | ICD-10-CM | POA: Diagnosis not present

## 2015-06-10 DIAGNOSIS — I69351 Hemiplegia and hemiparesis following cerebral infarction affecting right dominant side: Secondary | ICD-10-CM

## 2015-06-10 DIAGNOSIS — I69851 Hemiplegia and hemiparesis following other cerebrovascular disease affecting right dominant side: Secondary | ICD-10-CM | POA: Diagnosis not present

## 2015-06-10 NOTE — Patient Instructions (Signed)
Sleep Apnea  Sleep apnea is a sleep disorder characterized by abnormal pauses in breathing while you sleep. When your breathing pauses, the level of oxygen in your blood decreases. This causes you to move out of deep sleep and into light sleep. As a result, your quality of sleep is poor, and the system that carries your blood throughout your body (cardiovascular system) experiences stress. If sleep apnea remains untreated, the following conditions can develop:  High blood pressure (hypertension).  Coronary artery disease.  Inability to achieve or maintain an erection (impotence).  Impairment of your thought process (cognitive dysfunction). There are three types of sleep apnea: 1. Obstructive sleep apnea--Pauses in breathing during sleep because of a blocked airway. 2. Central sleep apnea--Pauses in breathing during sleep because the area of the brain that controls your breathing does not send the correct signals to the muscles that control breathing. 3. Mixed sleep apnea--A combination of both obstructive and central sleep apnea. RISK FACTORS The following risk factors can increase your risk of developing sleep apnea:  Being overweight.  Smoking.  Having narrow passages in your nose and throat.  Being of older age.  Being male.  Alcohol use.  Sedative and tranquilizer use.  Ethnicity. Among individuals younger than 35 years, African Americans are at increased risk of sleep apnea. SYMPTOMS   Difficulty staying asleep.  Daytime sleepiness and fatigue.  Loss of energy.  Irritability.  Loud, heavy snoring.  Morning headaches.  Trouble concentrating.  Forgetfulness.  Decreased interest in sex. DIAGNOSIS  In order to diagnose sleep apnea, your caregiver will perform a physical examination. Your caregiver may suggest that you take a home sleep test. Your caregiver may also recommend that you spend the night in a sleep lab. In the sleep lab, several monitors record  information about your heart, lungs, and brain while you sleep. Your leg and arm movements and blood oxygen level are also recorded. TREATMENT The following actions may help to resolve mild sleep apnea:  Sleeping on your side.   Using a decongestant if you have nasal congestion.   Avoiding the use of depressants, including alcohol, sedatives, and narcotics.   Losing weight and modifying your diet if you are overweight. There also are devices and treatments to help open your airway:  Oral appliances. These are custom-made mouthpieces that shift your lower jaw forward and slightly open your bite. This opens your airway.  Devices that create positive airway pressure. This positive pressure "splints" your airway open to help you breathe better during sleep. The following devices create positive airway pressure:  Continuous positive airway pressure (CPAP) device. The CPAP device creates a continuous level of air pressure with an air pump. The air is delivered to your airway through a mask while you sleep. This continuous pressure keeps your airway open.  Nasal expiratory positive airway pressure (EPAP) device. The EPAP device creates positive air pressure as you exhale. The device consists of single-use valves, which are inserted into each nostril and held in place by adhesive. The valves create very little resistance when you inhale but create much more resistance when you exhale. That increased resistance creates the positive airway pressure. This positive pressure while you exhale keeps your airway open, making it easier to breath when you inhale again.  Bilevel positive airway pressure (BPAP) device. The BPAP device is used mainly in patients with central sleep apnea. This device is similar to the CPAP device because it also uses an air pump to deliver continuous air pressure   through a mask. However, with the BPAP machine, the pressure is set at two different levels. The pressure when you  exhale is lower than the pressure when you inhale.  Surgery. Typically, surgery is only done if you cannot comply with less invasive treatments or if the less invasive treatments do not improve your condition. Surgery involves removing excess tissue in your airway to create a wider passage way. Document Released: 12/04/2002 Document Revised: 04/10/2013 Document Reviewed: 04/21/2012 [employer] Patient Information 2015 [employer]. This information is not intended to replace advice given to you by your health care provider. Make sure you discuss any questions you have with your health care provider.  

## 2015-06-10 NOTE — Progress Notes (Signed)
SLEEP MEDICINE CLINIC   Provider:  Larey Seat, M D  Referring Provider: Redmond School, MD Primary Care Physician:  Glenn [name]., MD  Chief Complaint  Patient presents with  . sleep consult    rm 10, new patient, with daughter    HPI:  Glenn [name] is a 74 y.o. male   seen here as a referralt  from Dr. Gerarda [name] for  A sleep consultation , with additional concerns of memory loss.   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 He has been a smoker all his adult life and has not been able to kick the habit as of yet. The sleep consultation came about because the patient has hypertension had a previous stroke and has COPD he also has always snored, and his wife hears him in the next room.  Dr. Riley [name] ordered a overnight pulse oximetry test. This revealed that the patient stayed at or below 89% oxygenation for 7 hours and 24 minutes the equivalent of 85% of the tested time area interestingly as the night progressed his oxygen saturation actually improved. This is the opposite from what I would expect if the patient has fatigable breathing. His cardiac function however bradycardia and tachycardia increased with the ongoing night. His pulse summary very between 96 bpm and 44 bpm. This test was performed on the night 4-20 2-16 and in [facility] 9 hours and 9 minutes of recording time. The oxygen nadir was 78%. Average duration for a desaturation was 55 seconds, which is prolonged.He has trouble to form full sentences, as he gives his history.  He goes to bed at 9 Pm and gets up at 5 AM. He sleeps alone. His bedroom is cool, quiet and dark, He wake up 3 times on average for nocturia, and he usually has a cigarette before he returns to be. He drinks a couple of Colas each day and 2 cups of coffee in the AM. He doesn't drink alcohol. He is on chronic narcotic pain medication- oxycodone 10 mg 3 times a day. He may suffer form reduced breathing drive ,  central apneas. He takes 3 doses, the last at 6 PM.   He has COPD and chronic pain. Is on ambien for sleep induction , and on Lunesta, too . She was diagnosed with a milder degree of renal failure and was changed from hydrocodone to oxycodone in response to the diagnosis. He is on pro-air, albuterol, amlodipine, Plavix aspirin, oxycodone, Lunesta, Ambien as needed, Nexium, Zocor, Norvasc. He had the first TIA and 2001 he had a stroke in 2012 and then in November 2014 had a major stroke but only at the symptoms of confusion. There was no motor deficit noticed and his speech had initially not changed. No concussion , TBI.    Review of Systems: Out of a complete 14 system review, the patient complains of only the following symptoms, and all other reviewed systems are negative.   Epworth score 11 , Fatigue severity score 52  , PHQ 9 depression score 6 " I am frustrated with my speech " . Loud snoring.   History   Social History  . Marital Status: Married    Spouse Name: N/A  . Number of Children: N/A  . Years of Education: N/A   Occupational History  . Not on file.   Social History Main Topics  . Smoking status: Current Every Day Smoker -- 1.50 packs/day for 50 years  .  Smokeless tobacco: Not on file  . Alcohol Use: No  . Drug Use: Yes    Special: Oxycodone  . Sexual Activity: Yes   Other Topics Concern  . Not on file   Social History Narrative   Lives with his wife and three boys.  Some stairs in the house.  Does not need any assist devices.        Drinks 4 cups of caffeine daily.    Family History  Problem Relation Age of Onset  . Other Father   . High blood pressure Maternal Grandmother   . High blood pressure Maternal Grandfather   . Stroke Maternal Aunt   . Stroke Maternal Uncle   . Diabetes Neg Hx   . Cancer Neg Hx     Past Medical History  Diagnosis Date  . HTN (hypertension)   . Memory changes   . Back pain   . GERD (gastroesophageal reflux disease)   .  Stroke     2001 no residual symptom, 2013 no residual symptom  . COPD (chronic obstructive pulmonary disease)   . Tobacco use   . Hyperlipidemia   . Hypogonadism in male     Past Surgical History  Procedure Laterality Date  . Back surgery      4 surgeries  . Carpal tunnel release Bilateral     2 surger    Current Outpatient Prescriptions  Medication Sig Dispense Refill  . albuterol (PROVENTIL HFA;VENTOLIN HFA) 108 (90 BASE) MCG/ACT inhaler Inhale 2 puffs into the lungs every 6 (six) hours as needed for wheezing or shortness of breath.    Marland Kitchen albuterol (PROVENTIL) 2 MG tablet Take 2 mg by mouth 3 (three) times daily.    Marland Kitchen amLODipine (NORVASC) 10 MG tablet Take 10 mg by mouth daily.    Marland Kitchen aspirin 325 MG EC tablet Take 1 tablet (325 mg total) by mouth daily. 30 tablet 0  . atorvastatin (LIPITOR) 20 MG tablet Take 1 tablet (20 mg total) by mouth daily at 6 PM. 30 tablet 0  . Cholecalciferol (VITAMIN D-3) 1000 UNITS CAPS Take 1 capsule by mouth daily.    . clopidogrel (PLAVIX) 75 MG tablet Take 1 tablet (75 mg total) by mouth daily with breakfast. 30 tablet 0  . esomeprazole (NEXIUM) 40 MG capsule Take 40 mg by mouth daily at 12 noon.    . Eszopiclone 3 MG TABS Take 3 mg by mouth at bedtime. Take immediately before bedtime    . mupirocin ointment (BACTROBAN) 2 % Place 1 application into the nose 3 (three) times daily as needed.    . Oxycodone HCl 10 MG TABS Take 10 mg by mouth 3 (three) times daily as needed. For pain    . zolpidem (AMBIEN) 10 MG tablet Take 10 mg by mouth at bedtime as needed for sleep.    . [DISCONTINUED] hydrochlorothiazide (HYDRODIURIL) 25 MG tablet Take 25 mg by mouth daily.      . [DISCONTINUED] lisinopril (PRINIVIL,ZESTRIL) 40 MG tablet Take 40 mg by mouth daily. 1/2 tab     . [DISCONTINUED] simvastatin (ZOCOR) 80 MG tablet Take 80 mg by mouth at bedtime. 1/2 tab daily      No current facility-administered medications for this visit.    Allergies as of 06/10/2015    . (No Known Allergies)    Vitals: BP 148/66 mmHg  Pulse 60  Resp 20  Ht 5' 8.5" (1.74 m)  Wt 151 lb (68.493 kg)  BMI 22.62 kg/m2 Last Weight:  Wt Readings from Last 1 Encounters:  06/10/15 151 lb (68.493 kg)       Last Height:   Ht Readings from Last 1 Encounters:  06/10/15 5' 8.5" (1.74 m)    Physical exam:  General: The patient is awake, alert and appears not in acute distress. The patient is well groomed. Head: Normocephalic, atraumatic. Neck is supple. Mallampati 3 , assymetric- Uvula , right lower neck circumference:15. Nasal airflow unrestricted , but significant nasal septal deviation to the right. TMJ is not evident . Retrognathia is not *seen.  Cardiovascular:  Regular rate and rhythm , without  murmurs or carotid bruit, and without distended neck veins. Respiratory: Lungs with ronci- to  Auscultation. Phlegm. wheezing  Skin:  Without evidence of edema, or rash Trunk: BMI is normal posture.  Neurologic exam : The patient is awake and alert, oriented to place and time.   Memory subjective described as impaired since the stroke, word generation in speech and writing.  MOCA was poor, at 12-30 points, a significant impairment.  There is a normal attention span & concentration ability. Speech is non-fluent without dysarthria, Cranial nerves: Pupils are equal and briskly reactive to light. Funduscopic exam without evidence of pallor or edema.  Extraocular movements in vertical  planes saccadic -  without nystagmus. Visual fields homonymous ischemia anopsia to the right. Is also causes a saccadic eye movements to the right. Hearing to finger rub intact.  Facial sensation intact to fine touch. Facial motor strength is symmetric and tongue and uvula move midline.  Motor exam:   He has similar muscle bulk bilaterally, but his muscle tone is slightly more spastic on the right and he has a little more clumsiness with rapid alternating movements. He also has a right foot drop that  is incomplete.  Sensory:  Fine touch, pinprick and vibration were tested in all extremities.  Proprioception is tested in the upper extremities as normal.  Coordination: Rapid alternating movements in the fingers/hands is normal.  Finger-to-nose maneuver normal without evidence of ataxia, dysmetria or tremor.  Gait and station: Patient walks without assistive device and is able unassisted to climb up to the exam table. He has a foot drop and clumsiness over the the right hand foot, loss of fine motor .  Strength within normal limits. Stance is stable and normal.  Tandem gait is deferred,  Romberg testing is negative.  Deep tendon reflexes: in the  upper and lower extremities are symmetric and intact. Babinski maneuver response is upgoing on the right. He has sudden jerking movements on the right side.    Assessment:  After physical and neurologic examination, review of laboratory studies, imaging, neurophysiology testing and pre-existing records, assessment is   1) the patient is status post 2 strokes and one TIA, this alone would place him at a higher risk for sleep apnea as OSA is one of the main causes for embolic strokes. In addition he has been on opiate pain medications which can induce central apneas. The underlying condition of COPD could be exacerbated by this. And he had proven low oxygen levels at night also CO2 or capnography was not performed. Plan is to invite the patient for a split night polysomnography with CO2 measures. #2 the patient has significant word finding difficulties but he has completed speech therapy after his last stroke. My concern is that the [facility] cognitive assessment test is affected by the clumsiness remaining in his right hand as well as his speech difficulties given that his stroke  must have been located in the left MCA distribution. This however would not explain the delayed recall or the problems with orientation. Minor subtraction. 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 Complains of a high degree of fatigue and he complains not so much of daytime sleepiness itself he denies any sleep attacks or uncontrollable urge to sleep. He has been less independent in activities of daily living since his stroke due to the deficits described above. He is not allowed to DRIVE.    The patient was advised of the nature of the diagnosed sleep disorder , the treatment options and risks for general a health and wellness arising from not treating the condition. Visit duration was 50 minutes.   Plan:  Treatment plan and additional workup :  See above , 50 % of u this visit for evaluation of obstructive sleep apnea versus central sleep apnea which is a condition where the patient is not reminded to take the next breath sometimes under the influence of pain medication sometimes under the influence of hypercapnia in relation to COPD. We also will evaluate him in our follow-up visits with a Mini-Mental Status Examination by Lillia Corporal. The can adjust the questionnaire to the patient's disability.  MRI reviewed and labs reviewed from hospitalization and [facility].    Asencion Partridge Jamar Weatherall MD  06/10/2015

## 2015-06-12 DIAGNOSIS — Z6823 Body mass index (BMI) 23.0-23.9, adult: Secondary | ICD-10-CM | POA: Diagnosis not present

## 2015-06-12 DIAGNOSIS — G894 Chronic pain syndrome: Secondary | ICD-10-CM | POA: Diagnosis not present

## 2015-06-19 ENCOUNTER — Ambulatory Visit (INDEPENDENT_AMBULATORY_CARE_PROVIDER_SITE_OTHER): Payer: Medicare Other | Admitting: Neurology

## 2015-06-19 DIAGNOSIS — G4733 Obstructive sleep apnea (adult) (pediatric): Secondary | ICD-10-CM | POA: Diagnosis not present

## 2015-06-19 DIAGNOSIS — I63032 Cerebral infarction due to thrombosis of left carotid artery: Secondary | ICD-10-CM

## 2015-06-19 DIAGNOSIS — F11282 Opioid dependence with opioid-induced sleep disorder: Secondary | ICD-10-CM

## 2015-06-19 DIAGNOSIS — G478 Other sleep disorders: Secondary | ICD-10-CM

## 2015-06-19 DIAGNOSIS — J441 Chronic obstructive pulmonary disease with (acute) exacerbation: Secondary | ICD-10-CM

## 2015-06-19 DIAGNOSIS — I69351 Hemiplegia and hemiparesis following cerebral infarction affecting right dominant side: Secondary | ICD-10-CM

## 2015-06-20 NOTE — Sleep Study (Signed)
Please see the scanned sleep study interpretation located in the Procedure tab within the Chart Review section. 

## 2015-06-24 ENCOUNTER — Telehealth: Payer: Self-pay

## 2015-06-24 DIAGNOSIS — G4733 Obstructive sleep apnea (adult) (pediatric): Secondary | ICD-10-CM

## 2015-06-24 NOTE — Telephone Encounter (Signed)
Spoke to pt's wife (per DPR) regarding sleep study. Informed them that the cpap titration study did not reveal an optimal treatment pressure and that Dr. Alfredo Batty a full night attended bipap titration study. Pt's wife said to please order that study for the pt.  Dr. Brett Fairy is unavailable for the next two weeks. Dr. Rexene Alberts is unable to sign order based on insurance purposes and authorization. Will send order as "protocol" and Dr. Brett Fairy to sign when she is available.

## 2015-07-31 ENCOUNTER — Ambulatory Visit (INDEPENDENT_AMBULATORY_CARE_PROVIDER_SITE_OTHER): Payer: Medicare Other | Admitting: Neurology

## 2015-07-31 DIAGNOSIS — G4719 Other hypersomnia: Secondary | ICD-10-CM

## 2015-07-31 DIAGNOSIS — G4733 Obstructive sleep apnea (adult) (pediatric): Secondary | ICD-10-CM | POA: Diagnosis not present

## 2015-07-31 DIAGNOSIS — I639 Cerebral infarction, unspecified: Secondary | ICD-10-CM

## 2015-07-31 NOTE — Sleep Study (Signed)
Please see the scanned sleep study interpretation located in the Procedure tab within the Chart Review section. 

## 2015-08-05 ENCOUNTER — Telehealth: Payer: Self-pay

## 2015-08-05 DIAGNOSIS — G4731 Primary central sleep apnea: Secondary | ICD-10-CM

## 2015-08-05 NOTE — Telephone Encounter (Signed)
Called to give pt sleep study results. Spoke to pt's wife (per DPR). Advised pt's wife that Dr. Brett Fairy recommended starting an ASV to treat pt's sleep apnea. Pt's wife said she needed to discuss this with the pt. She said she would call me back with his decision, if he wanted to start the ASV or not. I gave pt's wife our phone number and asked her to call me with their decision.

## 2015-08-08 NOTE — Telephone Encounter (Signed)
Pt's wife called me back and stated that the pt is going to start the ASV. She is requesting me to send the order to [facility]. She is also asking if Dr. Brett Fairy would write a letter to the [facility] stating that pt should not be allowed to drive. I advised pt that I would ask Dr. Brett Fairy and get back to her. We made a f/u appt for pt on 10/6 at 10:30 and I advised pt's wife to bring ASV and arrive 15 minutes early to his appt.

## 2015-08-08 NOTE — Telephone Encounter (Signed)
Advised pt that Dr. Brett Fairy wrote a letter stating the pt cannot drive per her neurological assessment of the pt. Pt's wife asked that it may be mailed to the house. Letter is in the mail. Faxed orders for ASV to [facility]. Pt verbalized understanding.

## 2015-08-09 DIAGNOSIS — G4719 Other hypersomnia: Secondary | ICD-10-CM | POA: Insufficient documentation

## 2015-08-09 DIAGNOSIS — I639 Cerebral infarction, unspecified: Secondary | ICD-10-CM | POA: Insufficient documentation

## 2015-08-16 DIAGNOSIS — I10 Essential (primary) hypertension: Secondary | ICD-10-CM | POA: Diagnosis not present

## 2015-08-16 DIAGNOSIS — Z6824 Body mass index (BMI) 24.0-24.9, adult: Secondary | ICD-10-CM | POA: Diagnosis not present

## 2015-08-16 DIAGNOSIS — E039 Hypothyroidism, unspecified: Secondary | ICD-10-CM | POA: Diagnosis not present

## 2015-08-16 DIAGNOSIS — E291 Testicular hypofunction: Secondary | ICD-10-CM | POA: Diagnosis not present

## 2015-08-16 DIAGNOSIS — E782 Mixed hyperlipidemia: Secondary | ICD-10-CM | POA: Diagnosis not present

## 2015-08-16 DIAGNOSIS — Z0001 Encounter for general adult medical examination with abnormal findings: Secondary | ICD-10-CM | POA: Diagnosis not present

## 2015-08-16 DIAGNOSIS — J449 Chronic obstructive pulmonary disease, unspecified: Secondary | ICD-10-CM | POA: Diagnosis not present

## 2015-08-16 DIAGNOSIS — L700 Acne vulgaris: Secondary | ICD-10-CM | POA: Diagnosis not present

## 2015-08-16 DIAGNOSIS — Z1389 Encounter for screening for other disorder: Secondary | ICD-10-CM | POA: Diagnosis not present

## 2015-08-16 DIAGNOSIS — Z719 Counseling, unspecified: Secondary | ICD-10-CM | POA: Diagnosis not present

## 2015-08-16 DIAGNOSIS — E538 Deficiency of other specified B group vitamins: Secondary | ICD-10-CM | POA: Diagnosis not present

## 2015-08-16 DIAGNOSIS — N400 Enlarged prostate without lower urinary tract symptoms: Secondary | ICD-10-CM | POA: Diagnosis not present

## 2015-08-16 DIAGNOSIS — K219 Gastro-esophageal reflux disease without esophagitis: Secondary | ICD-10-CM | POA: Diagnosis not present

## 2015-08-16 DIAGNOSIS — G4730 Sleep apnea, unspecified: Secondary | ICD-10-CM | POA: Diagnosis not present

## 2015-09-27 DIAGNOSIS — R809 Proteinuria, unspecified: Secondary | ICD-10-CM | POA: Diagnosis not present

## 2015-09-27 DIAGNOSIS — N183 Chronic kidney disease, stage 3 (moderate): Secondary | ICD-10-CM | POA: Diagnosis not present

## 2015-09-27 DIAGNOSIS — E559 Vitamin D deficiency, unspecified: Secondary | ICD-10-CM | POA: Diagnosis not present

## 2015-09-27 DIAGNOSIS — I10 Essential (primary) hypertension: Secondary | ICD-10-CM | POA: Diagnosis not present

## 2015-10-03 ENCOUNTER — Ambulatory Visit: Payer: Self-pay | Admitting: Neurology

## 2015-10-14 DIAGNOSIS — Z720 Tobacco use: Secondary | ICD-10-CM | POA: Diagnosis not present

## 2015-10-14 DIAGNOSIS — M79602 Pain in left arm: Secondary | ICD-10-CM | POA: Diagnosis not present

## 2015-10-14 DIAGNOSIS — Q211 Atrial septal defect: Secondary | ICD-10-CM | POA: Diagnosis not present

## 2015-10-15 DIAGNOSIS — Z1389 Encounter for screening for other disorder: Secondary | ICD-10-CM | POA: Diagnosis not present

## 2015-10-15 DIAGNOSIS — Z23 Encounter for immunization: Secondary | ICD-10-CM | POA: Diagnosis not present

## 2015-10-15 DIAGNOSIS — M25812 Other specified joint disorders, left shoulder: Secondary | ICD-10-CM | POA: Diagnosis not present

## 2015-10-15 DIAGNOSIS — Z6823 Body mass index (BMI) 23.0-23.9, adult: Secondary | ICD-10-CM | POA: Diagnosis not present

## 2015-10-15 DIAGNOSIS — G894 Chronic pain syndrome: Secondary | ICD-10-CM | POA: Diagnosis not present

## 2015-10-15 DIAGNOSIS — M65811 Other synovitis and tenosynovitis, right shoulder: Secondary | ICD-10-CM | POA: Diagnosis not present

## 2015-10-25 ENCOUNTER — Other Ambulatory Visit (HOSPITAL_COMMUNITY): Payer: Self-pay | Admitting: Physician Assistant

## 2015-10-25 ENCOUNTER — Ambulatory Visit (HOSPITAL_COMMUNITY)
Admission: RE | Admit: 2015-10-25 | Discharge: 2015-10-25 | Disposition: A | Discharge status: Home / Self Care | Payer: Medicare Other | Source: Ambulatory Visit | Attending: Physician Assistant | Admitting: Physician Assistant

## 2015-10-25 DIAGNOSIS — M25512 Pain in left shoulder: Secondary | ICD-10-CM | POA: Diagnosis not present

## 2015-10-25 DIAGNOSIS — M25612 Stiffness of left shoulder, not elsewhere classified: Secondary | ICD-10-CM | POA: Insufficient documentation

## 2015-10-25 DIAGNOSIS — G894 Chronic pain syndrome: Secondary | ICD-10-CM

## 2015-10-25 DIAGNOSIS — Z1389 Encounter for screening for other disorder: Secondary | ICD-10-CM

## 2015-11-11 ENCOUNTER — Ambulatory Visit: Payer: Medicare Other | Admitting: Neurology

## 2015-11-27 DIAGNOSIS — G894 Chronic pain syndrome: Secondary | ICD-10-CM | POA: Diagnosis not present

## 2015-11-27 DIAGNOSIS — G4701 Insomnia due to medical condition: Secondary | ICD-10-CM | POA: Diagnosis not present

## 2015-11-27 DIAGNOSIS — Z1389 Encounter for screening for other disorder: Secondary | ICD-10-CM | POA: Diagnosis not present

## 2015-11-27 DIAGNOSIS — Z6822 Body mass index (BMI) 22.0-22.9, adult: Secondary | ICD-10-CM | POA: Diagnosis not present

## 2015-12-03 DIAGNOSIS — N183 Chronic kidney disease, stage 3 (moderate): Secondary | ICD-10-CM | POA: Diagnosis not present

## 2015-12-03 DIAGNOSIS — D509 Iron deficiency anemia, unspecified: Secondary | ICD-10-CM | POA: Diagnosis not present

## 2015-12-03 DIAGNOSIS — I10 Essential (primary) hypertension: Secondary | ICD-10-CM | POA: Diagnosis not present

## 2015-12-03 DIAGNOSIS — Z79899 Other long term (current) drug therapy: Secondary | ICD-10-CM | POA: Diagnosis not present

## 2015-12-03 DIAGNOSIS — R809 Proteinuria, unspecified: Secondary | ICD-10-CM | POA: Diagnosis not present

## 2015-12-03 DIAGNOSIS — E559 Vitamin D deficiency, unspecified: Secondary | ICD-10-CM | POA: Diagnosis not present

## 2015-12-03 DIAGNOSIS — D649 Anemia, unspecified: Secondary | ICD-10-CM | POA: Diagnosis not present

## 2015-12-10 DIAGNOSIS — D509 Iron deficiency anemia, unspecified: Secondary | ICD-10-CM | POA: Diagnosis not present

## 2015-12-10 DIAGNOSIS — I10 Essential (primary) hypertension: Secondary | ICD-10-CM | POA: Diagnosis not present

## 2015-12-10 DIAGNOSIS — N183 Chronic kidney disease, stage 3 (moderate): Secondary | ICD-10-CM | POA: Diagnosis not present

## 2015-12-10 DIAGNOSIS — E559 Vitamin D deficiency, unspecified: Secondary | ICD-10-CM | POA: Diagnosis not present

## 2015-12-10 DIAGNOSIS — R809 Proteinuria, unspecified: Secondary | ICD-10-CM | POA: Diagnosis not present

## 2015-12-16 ENCOUNTER — Encounter (HOSPITAL_COMMUNITY): Admission: RE | Admit: 2015-12-16 | Payer: Medicare Other | Source: Ambulatory Visit

## 2015-12-17 MED ORDER — SODIUM CHLORIDE 0.9 % IV SOLN
510.0000 mg | INTRAVENOUS | Status: DC
  Filled 2015-12-17: qty 17

## 2015-12-18 ENCOUNTER — Encounter (HOSPITAL_COMMUNITY): Payer: Self-pay

## 2015-12-18 ENCOUNTER — Encounter (HOSPITAL_COMMUNITY)
Admission: RE | Admit: 2015-12-18 | Discharge: 2015-12-18 | Disposition: A | Discharge status: Home / Self Care | Payer: Medicare Other | Source: Ambulatory Visit | Attending: Nephrology | Admitting: Nephrology

## 2015-12-18 DIAGNOSIS — D509 Iron deficiency anemia, unspecified: Secondary | ICD-10-CM | POA: Diagnosis not present

## 2015-12-18 MED ORDER — SODIUM CHLORIDE 0.9 % IV SOLN
INTRAVENOUS | Status: DC
  Administered 2015-12-18: 250 mL via INTRAVENOUS

## 2015-12-18 MED ORDER — SODIUM CHLORIDE 0.9 % IV SOLN
510.0000 mg | INTRAVENOUS | Status: DC
  Administered 2015-12-18: 510 mg via INTRAVENOUS
  Filled 2015-12-18: qty 17

## 2015-12-18 NOTE — Discharge Instructions (Signed)
Ferumoxytol injection What is this medicine? FERUMOXYTOL is an iron complex. Iron is used to make healthy red blood cells, which carry oxygen and nutrients throughout the body. This medicine is used to treat iron deficiency anemia in people with chronic kidney disease. This medicine may be used for other purposes; ask your health care provider or pharmacist if you have questions. What should I tell my health care provider before I take this medicine? They need to know if you have any of these conditions: -anemia not caused by low iron levels -high levels of iron in the blood -magnetic resonance imaging (MRI) test scheduled -an unusual or allergic reaction to iron, other medicines, foods, dyes, or preservatives -pregnant or trying to get pregnant -breast-feeding How should I use this medicine? This medicine is for injection into a vein. It is given by a health care professional in a hospital or clinic setting. Talk to your pediatrician regarding the use of this medicine in children. Special care may be needed. Overdosage: If you think you have taken too much of this medicine contact a poison control center or emergency room at once. NOTE: This medicine is only for you. Do not share this medicine with others. What if I miss a dose? It is important not to miss your dose. Call your doctor or health care professional if you are unable to keep an appointment. What may interact with this medicine? This medicine may interact with the following medications: -other iron products This list may not describe all possible interactions. Give your health care provider a list of all the medicines, herbs, non-prescription drugs, or dietary supplements you use. Also tell them if you smoke, drink alcohol, or use illegal drugs. Some items may interact with your medicine. What should I watch for while using this medicine? Visit your doctor or healthcare professional regularly. Tell your doctor or healthcare  professional if your symptoms do not start to get better or if they get worse. You may need blood work done while you are taking this medicine. You may need to follow a special diet. Talk to your doctor. Foods that contain iron include: whole grains/cereals, dried fruits, beans, or peas, leafy green vegetables, and organ meats (liver, kidney). What side effects may I notice from receiving this medicine? Side effects that you should report to your doctor or health care professional as soon as possible: -allergic reactions like skin rash, itching or hives, swelling of the face, lips, or tongue -breathing problems -changes in blood pressure -feeling faint or lightheaded, falls -fever or chills -flushing, sweating, or hot feelings -swelling of the ankles or feet Side effects that usually do not require medical attention (Report these to your doctor or health care professional if they continue or are bothersome.): -diarrhea -headache -nausea, vomiting -stomach pain This list may not describe all possible side effects. Call your doctor for medical advice about side effects. You may report side effects to FDA at 1-800-FDA-1088. Where should I keep my medicine? This drug is given in a hospital or clinic and will not be stored at home. NOTE: This sheet is a summary. It may not cover all possible information. If you have questions about this medicine, talk to your doctor, pharmacist, or health care provider.    2016, Elsevier/Gold Standard. (2012-07-29 15:23:36)    Iron Deficiency Anemia, Adult Anemia is a condition in which there are less red blood cells or hemoglobin in the blood than normal. Hemoglobin is the part of red blood cells that carries oxygen.   Iron deficiency anemia is anemia caused by too little iron. It is the most common type of anemia. It may leave you tired and short of breath. CAUSES   Lack of iron in the diet.  Poor absorption of iron, as seen with intestinal  disorders.  Intestinal bleeding.  Heavy periods. SIGNS AND SYMPTOMS  Mild anemia may not be noticeable. Symptoms may include:  Fatigue.  Headache.  Pale skin.  Weakness.  Tiredness.  Shortness of breath.  Dizziness.  Cold hands and feet.  Fast or irregular heartbeat. DIAGNOSIS  Diagnosis requires a thorough evaluation and physical exam by your health care provider. Blood tests are generally used to confirm iron deficiency anemia. Additional tests may be done to find the underlying cause of your anemia. These may include:  Testing for blood in the stool (fecal occult blood test).  A procedure to see inside the colon and rectum (colonoscopy).  A procedure to see inside the esophagus and stomach (endoscopy). TREATMENT  Iron deficiency anemia is treated by correcting the cause of the deficiency. Treatment may involve:  Adding iron-rich foods to your diet.  Taking iron supplements. Pregnant or breastfeeding women need to take extra iron because their normal diet usually does not provide the required amount.  Taking vitamins. Vitamin C improves the absorption of iron. Your health care provider may recommend that you take your iron tablets with a glass of orange juice or vitamin C supplement.  Medicines to make heavy menstrual flow lighter.  Surgery. HOME CARE INSTRUCTIONS   Take iron as directed by your health care provider.  If you cannot tolerate taking iron supplements by mouth, talk to your health care provider about taking them through a vein (intravenously) or an injection into a muscle.  For the best iron absorption, iron supplements should be taken on an empty stomach. If you cannot tolerate them on an empty stomach, you may need to take them with food.  Do not drink milk or take antacids at the same time as your iron supplements. Milk and antacids may interfere with the absorption of iron.  Iron supplements can cause constipation. Make sure to include fiber  in your diet to prevent constipation. A stool softener may also be recommended.  Take vitamins as directed by your health care provider.  Eat a diet rich in iron. Foods high in iron include liver, lean beef, whole-grain bread, eggs, dried fruit, and dark green leafy vegetables. SEEK IMMEDIATE MEDICAL CARE IF:   You faint. If this happens, do not drive. Call your local emergency services (911 in [place] if no other help is available.  You have chest pain.  You feel nauseous or vomit.  You have severe or increased shortness of breath with activity.  You feel weak.  You have a rapid heartbeat.  You have unexplained sweating.  You become light-headed when getting up from a chair or bed. MAKE SURE YOU:   Understand these instructions.  Will watch your condition.  Will get help right away if you are not doing well or get worse.   This information is not intended to replace advice given to you by your health care provider. Make sure you discuss any questions you have with your health care provider.   Document Released: 12/11/2000 Document Revised: 01/04/2015 Document Reviewed: 08/21/2013 Elsevier Interactive Patient Education 2016 Elsevier Inc.  

## 2015-12-25 ENCOUNTER — Encounter (HOSPITAL_COMMUNITY)
Admission: RE | Admit: 2015-12-25 | Discharge: 2015-12-25 | Disposition: A | Discharge status: Home / Self Care | Payer: Medicare Other | Source: Ambulatory Visit | Attending: Nephrology | Admitting: Nephrology

## 2015-12-25 ENCOUNTER — Encounter (HOSPITAL_COMMUNITY): Payer: Self-pay

## 2015-12-25 DIAGNOSIS — D509 Iron deficiency anemia, unspecified: Secondary | ICD-10-CM | POA: Diagnosis not present

## 2015-12-25 MED ORDER — SODIUM CHLORIDE 0.9 % IV SOLN
510.0000 mg | INTRAVENOUS | Status: DC
  Administered 2015-12-25: 510 mg via INTRAVENOUS
  Filled 2015-12-25: qty 17

## 2015-12-25 MED ORDER — SODIUM CHLORIDE 0.9 % IV SOLN
INTRAVENOUS | Status: DC
  Administered 2015-12-25: 250 mL via INTRAVENOUS

## 2016-01-09 DIAGNOSIS — G894 Chronic pain syndrome: Secondary | ICD-10-CM | POA: Diagnosis not present

## 2016-01-09 DIAGNOSIS — G4701 Insomnia due to medical condition: Secondary | ICD-10-CM | POA: Diagnosis not present

## 2016-01-09 DIAGNOSIS — Z6822 Body mass index (BMI) 22.0-22.9, adult: Secondary | ICD-10-CM | POA: Diagnosis not present

## 2016-01-09 DIAGNOSIS — Z1389 Encounter for screening for other disorder: Secondary | ICD-10-CM | POA: Diagnosis not present

## 2016-02-04 DIAGNOSIS — H3582 Retinal ischemia: Secondary | ICD-10-CM | POA: Diagnosis not present

## 2016-03-20 DIAGNOSIS — Z79899 Other long term (current) drug therapy: Secondary | ICD-10-CM | POA: Diagnosis not present

## 2016-03-20 DIAGNOSIS — N183 Chronic kidney disease, stage 3 (moderate): Secondary | ICD-10-CM | POA: Diagnosis not present

## 2016-03-20 DIAGNOSIS — I10 Essential (primary) hypertension: Secondary | ICD-10-CM | POA: Diagnosis not present

## 2016-03-20 DIAGNOSIS — R809 Proteinuria, unspecified: Secondary | ICD-10-CM | POA: Diagnosis not present

## 2016-03-20 DIAGNOSIS — D509 Iron deficiency anemia, unspecified: Secondary | ICD-10-CM | POA: Diagnosis not present

## 2016-03-20 DIAGNOSIS — E559 Vitamin D deficiency, unspecified: Secondary | ICD-10-CM | POA: Diagnosis not present

## 2016-03-23 DIAGNOSIS — E875 Hyperkalemia: Secondary | ICD-10-CM | POA: Diagnosis not present

## 2016-03-23 DIAGNOSIS — I10 Essential (primary) hypertension: Secondary | ICD-10-CM | POA: Diagnosis not present

## 2016-03-23 DIAGNOSIS — N183 Chronic kidney disease, stage 3 (moderate): Secondary | ICD-10-CM | POA: Diagnosis not present

## 2016-03-23 DIAGNOSIS — R809 Proteinuria, unspecified: Secondary | ICD-10-CM | POA: Diagnosis not present

## 2016-04-21 DIAGNOSIS — G894 Chronic pain syndrome: Secondary | ICD-10-CM | POA: Diagnosis not present

## 2016-04-21 DIAGNOSIS — M79641 Pain in right hand: Secondary | ICD-10-CM | POA: Diagnosis not present

## 2016-04-21 DIAGNOSIS — Z1389 Encounter for screening for other disorder: Secondary | ICD-10-CM | POA: Diagnosis not present

## 2016-04-21 DIAGNOSIS — Z6822 Body mass index (BMI) 22.0-22.9, adult: Secondary | ICD-10-CM | POA: Diagnosis not present

## 2016-04-27 DIAGNOSIS — Z720 Tobacco use: Secondary | ICD-10-CM | POA: Diagnosis not present

## 2016-04-27 DIAGNOSIS — R0602 Shortness of breath: Secondary | ICD-10-CM | POA: Diagnosis not present

## 2016-04-27 DIAGNOSIS — Q211 Atrial septal defect: Secondary | ICD-10-CM | POA: Diagnosis not present

## 2016-04-27 DIAGNOSIS — M79603 Pain in arm, unspecified: Secondary | ICD-10-CM | POA: Diagnosis not present

## 2016-07-14 DIAGNOSIS — I10 Essential (primary) hypertension: Secondary | ICD-10-CM | POA: Diagnosis not present

## 2016-07-14 DIAGNOSIS — F0390 Unspecified dementia without behavioral disturbance: Secondary | ICD-10-CM | POA: Diagnosis not present

## 2016-07-14 DIAGNOSIS — J449 Chronic obstructive pulmonary disease, unspecified: Secondary | ICD-10-CM | POA: Diagnosis not present

## 2016-07-14 DIAGNOSIS — Z6821 Body mass index (BMI) 21.0-21.9, adult: Secondary | ICD-10-CM | POA: Diagnosis not present

## 2016-07-16 DIAGNOSIS — Z1389 Encounter for screening for other disorder: Secondary | ICD-10-CM | POA: Diagnosis not present

## 2016-07-16 DIAGNOSIS — Z6821 Body mass index (BMI) 21.0-21.9, adult: Secondary | ICD-10-CM | POA: Diagnosis not present

## 2016-07-30 DIAGNOSIS — R809 Proteinuria, unspecified: Secondary | ICD-10-CM | POA: Diagnosis not present

## 2016-07-30 DIAGNOSIS — N183 Chronic kidney disease, stage 3 (moderate): Secondary | ICD-10-CM | POA: Diagnosis not present

## 2016-07-30 DIAGNOSIS — E559 Vitamin D deficiency, unspecified: Secondary | ICD-10-CM | POA: Diagnosis not present

## 2016-07-30 DIAGNOSIS — D509 Iron deficiency anemia, unspecified: Secondary | ICD-10-CM | POA: Diagnosis not present

## 2016-07-30 DIAGNOSIS — Z79899 Other long term (current) drug therapy: Secondary | ICD-10-CM | POA: Diagnosis not present

## 2016-07-30 DIAGNOSIS — I10 Essential (primary) hypertension: Secondary | ICD-10-CM | POA: Diagnosis not present

## 2016-08-04 DIAGNOSIS — R809 Proteinuria, unspecified: Secondary | ICD-10-CM | POA: Diagnosis not present

## 2016-08-04 DIAGNOSIS — D649 Anemia, unspecified: Secondary | ICD-10-CM | POA: Diagnosis not present

## 2016-08-04 DIAGNOSIS — N183 Chronic kidney disease, stage 3 (moderate): Secondary | ICD-10-CM | POA: Diagnosis not present

## 2016-08-04 DIAGNOSIS — R634 Abnormal weight loss: Secondary | ICD-10-CM | POA: Diagnosis not present

## 2016-08-04 DIAGNOSIS — I10 Essential (primary) hypertension: Secondary | ICD-10-CM | POA: Diagnosis not present

## 2016-08-05 DIAGNOSIS — I69351 Hemiplegia and hemiparesis following cerebral infarction affecting right dominant side: Secondary | ICD-10-CM | POA: Diagnosis not present

## 2016-08-05 DIAGNOSIS — E291 Testicular hypofunction: Secondary | ICD-10-CM | POA: Diagnosis not present

## 2016-08-05 DIAGNOSIS — I10 Essential (primary) hypertension: Secondary | ICD-10-CM | POA: Diagnosis not present

## 2016-08-05 DIAGNOSIS — R634 Abnormal weight loss: Secondary | ICD-10-CM | POA: Diagnosis not present

## 2016-08-05 DIAGNOSIS — J449 Chronic obstructive pulmonary disease, unspecified: Secondary | ICD-10-CM | POA: Diagnosis not present

## 2016-08-05 DIAGNOSIS — Z6821 Body mass index (BMI) 21.0-21.9, adult: Secondary | ICD-10-CM | POA: Diagnosis not present

## 2016-08-05 DIAGNOSIS — K219 Gastro-esophageal reflux disease without esophagitis: Secondary | ICD-10-CM | POA: Diagnosis not present

## 2016-08-27 DIAGNOSIS — N400 Enlarged prostate without lower urinary tract symptoms: Secondary | ICD-10-CM | POA: Diagnosis not present

## 2016-08-27 DIAGNOSIS — G894 Chronic pain syndrome: Secondary | ICD-10-CM | POA: Diagnosis not present

## 2016-08-27 DIAGNOSIS — Z719 Counseling, unspecified: Secondary | ICD-10-CM | POA: Diagnosis not present

## 2016-08-27 DIAGNOSIS — J449 Chronic obstructive pulmonary disease, unspecified: Secondary | ICD-10-CM | POA: Diagnosis not present

## 2016-08-27 DIAGNOSIS — Z1389 Encounter for screening for other disorder: Secondary | ICD-10-CM | POA: Diagnosis not present

## 2016-08-27 DIAGNOSIS — Z0000 Encounter for general adult medical examination without abnormal findings: Secondary | ICD-10-CM | POA: Diagnosis not present

## 2016-08-27 DIAGNOSIS — F0390 Unspecified dementia without behavioral disturbance: Secondary | ICD-10-CM | POA: Diagnosis not present

## 2016-08-27 DIAGNOSIS — Z6822 Body mass index (BMI) 22.0-22.9, adult: Secondary | ICD-10-CM | POA: Diagnosis not present

## 2016-10-01 ENCOUNTER — Telehealth: Payer: Self-pay | Admitting: Neurology

## 2016-10-01 NOTE — Telephone Encounter (Signed)
Patient's wife report Dr Gerarda Fraction has advised he needs to f/u with neurologist for stroke. Last OV was 6/16. She also reports the patient has dementia and depression which Dr Gerarda Fraction is treating. **.  An appointment was made for 11/2.  Advised that the nurse would call if there was any other questions.

## 2016-10-05 NOTE — Telephone Encounter (Signed)
I called and spoke with pt's wife to offer her two sooner appt slots this week. Pt declined both of the appt. She says they are ok with keeping the 10/29/16 appt.

## 2016-10-05 NOTE — Telephone Encounter (Signed)
I called to offer pt a sooner appt (10/11 at 8:30.) No answer, left a message asking pt/wife to call me back.

## 2016-10-13 DIAGNOSIS — M65311 Trigger thumb, right thumb: Secondary | ICD-10-CM | POA: Diagnosis not present

## 2016-10-27 ENCOUNTER — Telehealth: Payer: Self-pay | Admitting: Neurology

## 2016-10-27 NOTE — Telephone Encounter (Signed)
Patient's wife stated patient fell over the weekend and has a knot on his head. She tried to get into the PCP but they do not have any available apts. PCP suggested he be evaluated for the knot by Dr. D when he comes to the visit on Thursday. Best call back [phone]

## 2016-10-27 NOTE — Telephone Encounter (Signed)
Glenn [name] from [place] returned my call. Pt was set up on ASV on 08/19/2015 but returned it on 10/30/2015 because of non-compliance. Pt's wife told them that pt was refusing to use it.

## 2016-10-27 NOTE — Telephone Encounter (Signed)
Pt is coming on 10/29/2016. Just FYI.

## 2016-10-27 NOTE — Telephone Encounter (Signed)
I called [facility]. It is unsure if the pt was ever set up on ASV. I left a message with Mariann Laster asking her to call me back.

## 2016-10-29 ENCOUNTER — Ambulatory Visit (INDEPENDENT_AMBULATORY_CARE_PROVIDER_SITE_OTHER): Payer: Medicare Other | Admitting: Neurology

## 2016-10-29 ENCOUNTER — Encounter: Payer: Self-pay | Admitting: Neurology

## 2016-10-29 VITALS — BP 115/60 | HR 54 | Resp 18 | Wt 140.0 lb

## 2016-10-29 DIAGNOSIS — G4701 Insomnia due to medical condition: Secondary | ICD-10-CM

## 2016-10-29 DIAGNOSIS — R270 Ataxia, unspecified: Secondary | ICD-10-CM | POA: Diagnosis not present

## 2016-10-29 DIAGNOSIS — F0781 Postconcussional syndrome: Secondary | ICD-10-CM | POA: Diagnosis not present

## 2016-10-29 DIAGNOSIS — F0150 Vascular dementia without behavioral disturbance: Secondary | ICD-10-CM | POA: Diagnosis not present

## 2016-10-29 DIAGNOSIS — R42 Dizziness and giddiness: Secondary | ICD-10-CM

## 2016-10-29 MED ORDER — MEMANTINE HCL 5 MG PO TABS: 5.0000 mg | ORAL_TABLET | Freq: Two times a day (BID) | ORAL | 2 refills | Status: DC

## 2016-10-29 NOTE — Progress Notes (Signed)
SLEEP MEDICINE CLINIC   Provider:  Larey Seat, M D  Referring Provider: Redmond School, MD Primary Care Physician:  Glo Herring., MD  Chief Complaint  Patient presents with  . Stroke    rm 64, wife- Otila Kluver, "dementia per wife"  MMSE 18  . Follow-up    FU stroke; not using ASV    HPI:  Glenn [name] is a 75 y.o. male   seen here as a referralt  from Dr. Gerarda Fraction for  A sleep consultation , with additional concerns of memory loss.   06-10-2015 Glenn [name] suffered a stroke about 18 months ago and since then has a hesitant aphasic speech. He has been a smoker all his adult life and has not been able to kick the habit as of yet. The sleep consultation came about because the patient has hypertension had a previous stroke and has COPD he also has always snored, and his wife hears him in the next room.  Dr. Riley Kill ordered a overnight pulse oximetry test. This revealed that the patient stayed at or below 89% oxygenation for 7 hours and 24 minutes the equivalent of 85% of the tested time area interestingly as the night progressed his oxygen saturation actually improved. This is the opposite from what I would expect if the patient has fatigable breathing. His cardiac function however bradycardia and tachycardia increased with the ongoing night. His pulse summary very between 96 bpm and 44 bpm. This test was performed on the night 4-20 2-16 and in [facility] 9 hours and 9 minutes of recording time. The oxygen nadir was 78%. Average duration for a desaturation was 55 seconds, which is prolonged.He has trouble to form full sentences, as he gives his history.  He goes to bed at 9 Pm and gets up at 5 AM. He sleeps alone. His bedroom is cool, quiet and dark, He wake up 3 times on average for nocturia, and he usually has a cigarette before he returns to be. He drinks a couple of Colas each day and 2 cups of coffee in the AM. He doesn't drink alcohol. He is on chronic narcotic pain medication- oxycodone 10 mg  3 times a day. He may suffer form reduced breathing drive , central apneas. He takes 3 doses, the last at 6 PM.   He has COPD and chronic pain. Is on Ambien and on Lunesta, too . She was diagnosed with a milder degree of renal failure and was changed from hydrocodone to oxycodone in response to the diagnosis. He is on pro-air, albuterol, amlodipine, Plavix aspirin, oxycodone, Lunesta, Ambien as needed, Nexium, Zocor, Norvasc. He had the first TIA and 2001 he had a stroke in 2012 and then in November 2014 had a major stroke but only at the symptoms of confusion. There was no motor deficit noticed and his speech had initially not changed. No concussion , TBI.   Interval history from September 12, 202017. It has been over a year since we last have seen Glenn [name] here in the sleep clinic. He is a patient with a history of stroke, short-term memory loss, hypertension, COPD, active tobacco use, hyperlipidemia back pain and acid reflux. He was diagnosed on 06/19/2015 with an AHI of 28.7 and titrated to 10 cm water pressure. As the titration was not complete he returned for a full night titration on 07/31/2015, the patient responded not to CPAP and BiPAP with a ST function, pacing his breathing. He was also fitted to a full face mask. He had arrhythmia  by EKG, sleep fragmentation, periodic limb movements, and under a as the reached an AHI of 2.9. In the meantime his family is concerned that his memory has progressed to decline but he also has fallen about 14 days ago and hit his head. He had no previous history of traumatic brain injuries or concussions. He had seen his primary care physician, Dr. Gerarda Fraction, but had no CT scan. The fall happened on a staircase in his own home after the lights had failed. It occurred in the dark.  He bled, he did not feel nausea, there was no loss of consciousness according to him, but retrograde amnesia.  This may be the explanation for his fall. But he feels also that he is more unsteady just  walking on uneven surface and sometimes takes an extra step to secure his posture-  He feels as if he is pushed from the side or pulled,  drifts to the side , but not forward or backward.   Review of Systems: Out of a complete 14 system review, the patient complains of only the following symptoms, and all other reviewed systems are negative.   Epworth score 13 , Fatigue severity score 63  , PHQ 9 depression score 6 " I am frustrated with my speech " . Loud snoring. Possible ataxia, after fall, incoordination and balance, poor memory and speech. Very incoordinated, overshooting movements.    Social History   Social History  . Marital status: Married    Spouse name: N/A  . Number of children: N/A  . Years of education: N/A   Occupational History  . Not on file.   Social History Main Topics  . Smoking status: Current Every Day Smoker    Packs/day: 1.50    Years: 50.00  . Smokeless tobacco: Not on file  . Alcohol use No  . Drug use:     Types: Oxycodone  . Sexual activity: Yes   Other Topics Concern  . Not on file   Social History Narrative   Lives with his wife and three boys.  Some stairs in the house.  Does not need any assist devices.        Drinks 4 cups of caffeine daily.    Family History  Problem Relation Age of Onset  . Other Father   . High blood pressure Maternal Grandmother   . High blood pressure Maternal Grandfather   . Stroke Maternal Aunt   . Stroke Maternal Uncle   . Diabetes Neg Hx   . Cancer Neg Hx     Past Medical History:  Diagnosis Date  . Back pain   . COPD (chronic obstructive pulmonary disease) ([place])   . GERD (gastroesophageal reflux disease)   . HTN (hypertension)   . Hyperlipidemia   . Hypogonadism in male   . Memory changes   . Stroke [facility])    2001 no residual symptom, 2013 no residual symptom  . Tobacco use     Past Surgical History:  Procedure Laterality Date  . BACK SURGERY     4 surgeries  . CARPAL TUNNEL RELEASE  Bilateral    2 surger    Current Outpatient Prescriptions  Medication Sig Dispense Refill  . albuterol (PROVENTIL HFA;VENTOLIN HFA) 108 (90 BASE) MCG/ACT inhaler Inhale 2 puffs into the lungs every 6 (six) hours as needed for wheezing or shortness of breath.    Marland Kitchen amLODipine (NORVASC) 10 MG tablet Take 10 mg by mouth daily.    Marland Kitchen aspirin 325 MG EC  tablet Take 1 tablet (325 mg total) by mouth daily. 30 tablet 0  . atorvastatin (LIPITOR) 20 MG tablet Take 1 tablet (20 mg total) by mouth daily at 6 PM. 30 tablet 0  . buPROPion (WELLBUTRIN) 100 MG tablet 100 mg 3 (three) times daily.    . Cholecalciferol (VITAMIN D-3) 1000 UNITS CAPS Take 1 capsule by mouth daily. 2000 units    . clopidogrel (PLAVIX) 75 MG tablet Take 1 tablet (75 mg total) by mouth daily with breakfast. 30 tablet 0  . esomeprazole (NEXIUM) 40 MG capsule Take 40 mg by mouth daily at 12 noon.    Marland Kitchen losartan (COZAAR) 100 MG tablet 100 mg daily.    . mirtazapine (REMERON) 15 MG tablet 15 mg at bedtime.    . Oxycodone HCl 10 MG TABS Take 10 mg by mouth 3 (three) times daily as needed. For pain    . albuterol (PROVENTIL) 2 MG tablet Take 2 mg by mouth 3 (three) times daily.    . mupirocin ointment (BACTROBAN) 2 % Place 1 application into the nose 3 (three) times daily as needed.     No current facility-administered medications for this visit.     Allergies as of 02/25/202017  . (No Known Allergies)    Vitals: Wt 140 lb (63.5 kg)   BMI 20.98 kg/m  Last Weight:  Wt Readings from Last 1 Encounters:  10/29/16 140 lb (63.5 kg)       Last Height:   Ht Readings from Last 1 Encounters:  12/25/15 5' 8.5" (1.74 m)    Physical exam:  General: The patient is awake, alert and appears not in acute distress. The patient is well groomed. Head: Normocephalic, atraumatic. Neck is supple. Mallampati 3 , assymetric- Uvula , right lower neck circumference:15. Nasal airflow unrestricted , but significant nasal septal deviation to the right.     Cardiovascular: irregular pulse Respiratory: Lungs with wheezing  Skin:  Without evidence of edema, or rash Trunk: BMI is normal posture.  Neurologic exam : The patient is awake and alert, oriented to place and time.   Memory subjective described as impaired since the stroke, word generation in speech and writing. Stammering, fragmented speech.   MOCA was poor, at 12-30 points, a significant impairment.  MMSE - Mini Mental State Exam 10/29/2016  Orientation to time 2  Orientation to Place 4  Registration 3  Attention/ Calculation 2  Recall 0  Language- name 2 objects 2  Language- repeat 1  Language- follow 3 step command 3  Language- read & follow direction 1  Write a sentence 0  Copy design 0  Total score 18    Cranial nerves: Pupils are equal and briskly reactive to light. Funduscopic exam without evidence of pallor or edema. Eye movements were not restricted in the vertical or horizontal plane.  Extraocular movements in vertical  planes saccadic -  without nystagmus. Visual fields homonymous ischemia anopsia to the right. Is also causes a saccadic eye movements to the right. Hearing to finger rub intact.   Facial sensation intact to fine touch.  Facial motor strength is symmetric and tongue and uvula move midline.  Motor exam:   He has similar muscle bulk bilaterally, but his muscle tone is slightly more spastic on the right and he has a little more clumsiness with rapid alternating movements. He also has a right foot drop that is incomplete. He has some rigor over the biceps bilaterally, some trouble to relax. There is no cogwheeling noted.  Sensory:  Fine touch, pinprick and vibration were tested in all extremities.  Proprioception is tested in the upper extremities as normal.  Coordination: Rapid alternating movements in the fingers/hands is normal.  Finger-to-nose maneuver normal without evidence of ataxia, dysmetria or tremor.  Gait and station: Patient walks without  assistive device and is able unassisted to climb up to the exam table. He has a foot drop and clumsiness over the the right hand foot, loss of fine motor .  Strength within normal limits. Stance is stable and normal. He walked a straight line for me and could turned with 3 steps which was remarkable. He does have normal arm swing. Tandem gait is deferred,  Romberg testing is negative.  Deep tendon reflexes: in the  upper and lower extremities are symmetric and intact. Babinski maneuver response is upgoing on the right. He has sudden jerking movements on the right side. Myoclonic, but no clonus.    Assessment:  After physical and neurologic examination, review of laboratory studies, imaging, neurophysiology testing and pre-existing records, assessment is   1) the patient is status post 2 strokes and one TIA, this alone would place him at a higher risk for sleep apnea as OSA is one of the main causes for embolic strokes. In addition he has been on opiate pain medications which can induce central apneas.  The underlying condition of COPD could be exacerbated by this. And he had proven low oxygen levels at night also CO2 or capnography was not performed. His sleep studies demonstrated the need for an ASV machine, as he did not tolerate CPAP and had treatment emergent central apneas on BiPAP. However the patient has not been using his machine at home and is not willing to do so. He is also not willing to stop smoking.  #2 the patient has significant word finding difficulties but he has completed speech therapy after his last stroke. My concern is that the [facility] cognitive assessment test is affected by the clumsiness remaining in his right hand as well as his speech difficulties,  given that his stroke must have been located in the left MCA distribution.  This however would not explain the delayed recall or the problems with orientation. He is not allowed to DRIVE. Overall his memory deficits have  progressed he score today 18 points on a Mini-Mental Status Examination. He remains highly fatigued and sleepy in daytime. I suspect that a progression of microvascular disease is at the origin of his memory loss. He does know about the risk factors for stroke and the modifications for it.  3#the patient had a fall which occurred in the dark on his home staircase. He hit his head. He was actually gushing blood and the neighbor helped him to clean up. There is some retrograde amnesia but not loss of consciousness and no nausea reported. Overall he is very abrupt and some of his movements, incoordinated not necessarily clumsy but overshooting. This is a very saccadic eye movements may indicate a cerebellar atrophy or abnormality. He hasn't had an image study since he fell.   The patient was advised of the nature of the diagnosed gait and cerebrovascular disorder , his risk factors for vascular dementia a high as a include ongoing tobacco use, untreated complex sleep apnea, hypoxemia with COPD overlay, and previous strokes and TIAs.  I will obtain a brain image, and as I feel that there are some cerebellar components to his balance difficulties I would recommend an MRI and not a CT  scan of the head a comparison study was obtained 2 years ago. We do not need contrast. This is to evaluate for concussion, progressive memory loss, continued new neurologic deficits in gait, balance, coordination. He continues on aspirin and Plavix. I would like to put him on Namenda. Suspect vascular dementia.  I would like for him not to continue using Ambien at 10 mg and Xanax, he will make the decision which of the medications he will use at night.   Visit duration was 40 minutes. Patient advised of his new living will.    Plan:  Treatment plan and addition al workup :  MRI, brain for ongoing new neurologic deficits since his fall, ataxia, headaches.   Namenda, he refuses oxygen at night, refuses ASV use.   RV for  concussion follow up in 2 month ,  With either Np or me , check on Namenda ( prescribed at 5 mg bid ) to get up to 10 mg bid.    Glenn [name] Artrice Kraker MD  10/29/2016    Dr Gerarda Fraction

## 2016-10-29 NOTE — Patient Instructions (Signed)
Memantine Tablets What is this medicine? MEMANTINE (MEM an teen) is used to treat dementia caused by Alzheimer's disease. This medicine may be used for other purposes; ask your health care provider or pharmacist if you have questions. What should I tell my health care provider before I take this medicine? They need to know if you have any of these conditions: -difficulty passing urine -kidney disease -liver disease -seizures -an unusual or allergic reaction to memantine, other medicines, foods, dyes, or preservatives -pregnant or trying to get pregnant -breast-feeding How should I use this medicine? Take this medicine by mouth with a glass of water. Follow the directions on the prescription label. You may take this medicine with or without food. Take your doses at regular intervals. Do not take your medicine more often than directed. Continue to take your medicine even if you feel better. Do not stop taking except on the advice of your doctor or health care professional. Talk to your pediatrician regarding the use of this medicine in children. Special care may be needed. Overdosage: If you think you have taken too much of this medicine contact a poison control center or emergency room at once. NOTE: This medicine is only for you. Do not share this medicine with others. What if I miss a dose? If you miss a dose, take it as soon as you can. If it is almost time for your next dose, take only that dose. Do not take double or extra doses. If you do not take your medicine for several days, contact your health care provider. Your dose may need to be changed. What may interact with this medicine? -acetazolamide -amantadine -cimetidine -dextromethorphan -dofetilide -hydrochlorothiazide -ketamine -metformin -methazolamide -quinidine -ranitidine -sodium bicarbonate -triamterene This list may not describe all possible interactions. Give your health care provider a list of all the medicines,  herbs, non-prescription drugs, or dietary supplements you use. Also tell them if you smoke, drink alcohol, or use illegal drugs. Some items may interact with your medicine. What should I watch for while using this medicine? Visit your doctor or health care professional for regular checks on your progress. Check with your doctor or health care professional if there is no improvement in your symptoms or if they get worse. You may get drowsy or dizzy. Do not drive, use machinery, or do anything that needs mental alertness until you know how this drug affects you. Do not stand or sit up quickly, especially if you are an older patient. This reduces the risk of dizzy or fainting spells. Alcohol can make you more drowsy and dizzy. Avoid alcoholic drinks. What side effects may I notice from receiving this medicine? Side effects that you should report to your doctor or health care professional as soon as possible: -allergic reactions like skin rash, itching or hives, swelling of the face, lips, or tongue -agitation or a feeling of restlessness -depressed mood -dizziness -hallucinations -redness, blistering, peeling or loosening of the skin, including inside the mouth -seizures -vomiting Side effects that usually do not require medical attention (report to your doctor or health care professional if they continue or are bothersome): -constipation -diarrhea -headache -nausea -trouble sleeping This list may not describe all possible side effects. Call your doctor for medical advice about side effects. You may report side effects to FDA at 1-800-FDA-1088. Where should I keep my medicine? Keep out of the reach of children. Store at room temperature between 15 degrees and 30 degrees C (59 degrees and 86 degrees F). Throw away any  unused medicine after the expiration date. NOTE: This sheet is a summary. It may not cover all possible information. If you have questions about this medicine, talk to your doctor,  pharmacist, or health care provider.    2016, [employer]. (2013-10-02 14:10:42)

## 2016-10-29 NOTE — Addendum Note (Signed)
Addended by: Larey Seat on: 10/29/2016 11:53 AM   Modules accepted: Orders

## 2016-11-06 DIAGNOSIS — D509 Iron deficiency anemia, unspecified: Secondary | ICD-10-CM | POA: Diagnosis not present

## 2016-11-06 DIAGNOSIS — Z79899 Other long term (current) drug therapy: Secondary | ICD-10-CM | POA: Diagnosis not present

## 2016-11-06 DIAGNOSIS — E559 Vitamin D deficiency, unspecified: Secondary | ICD-10-CM | POA: Diagnosis not present

## 2016-11-06 DIAGNOSIS — I10 Essential (primary) hypertension: Secondary | ICD-10-CM | POA: Diagnosis not present

## 2016-11-06 DIAGNOSIS — R809 Proteinuria, unspecified: Secondary | ICD-10-CM | POA: Diagnosis not present

## 2016-11-06 DIAGNOSIS — N183 Chronic kidney disease, stage 3 (moderate): Secondary | ICD-10-CM | POA: Diagnosis not present

## 2016-11-10 DIAGNOSIS — N183 Chronic kidney disease, stage 3 (moderate): Secondary | ICD-10-CM | POA: Diagnosis not present

## 2016-11-10 DIAGNOSIS — R809 Proteinuria, unspecified: Secondary | ICD-10-CM | POA: Diagnosis not present

## 2016-11-10 DIAGNOSIS — N250 Renal osteodystrophy: Secondary | ICD-10-CM | POA: Diagnosis not present

## 2016-11-10 DIAGNOSIS — I10 Essential (primary) hypertension: Secondary | ICD-10-CM | POA: Diagnosis not present

## 2016-11-11 DIAGNOSIS — Z1389 Encounter for screening for other disorder: Secondary | ICD-10-CM | POA: Diagnosis not present

## 2016-11-11 DIAGNOSIS — N183 Chronic kidney disease, stage 3 (moderate): Secondary | ICD-10-CM | POA: Diagnosis not present

## 2016-11-11 DIAGNOSIS — J449 Chronic obstructive pulmonary disease, unspecified: Secondary | ICD-10-CM | POA: Diagnosis not present

## 2016-11-11 DIAGNOSIS — J439 Emphysema, unspecified: Secondary | ICD-10-CM | POA: Diagnosis not present

## 2016-11-11 DIAGNOSIS — Z23 Encounter for immunization: Secondary | ICD-10-CM | POA: Diagnosis not present

## 2016-11-11 DIAGNOSIS — Z6820 Body mass index (BMI) 20.0-20.9, adult: Secondary | ICD-10-CM | POA: Diagnosis not present

## 2016-11-21 ENCOUNTER — Ambulatory Visit
Admission: RE | Admit: 2016-11-21 | Discharge: 2016-11-21 | Disposition: A | Discharge status: Home / Self Care | Payer: Medicare Other | Source: Ambulatory Visit | Attending: Neurology | Admitting: Neurology

## 2016-11-21 DIAGNOSIS — F0150 Vascular dementia without behavioral disturbance: Secondary | ICD-10-CM

## 2016-11-21 DIAGNOSIS — R270 Ataxia, unspecified: Secondary | ICD-10-CM

## 2016-11-21 DIAGNOSIS — F0781 Postconcussional syndrome: Secondary | ICD-10-CM

## 2016-11-21 DIAGNOSIS — R42 Dizziness and giddiness: Secondary | ICD-10-CM

## 2016-11-21 DIAGNOSIS — G4701 Insomnia due to medical condition: Secondary | ICD-10-CM

## 2016-11-24 ENCOUNTER — Ambulatory Visit (HOSPITAL_COMMUNITY): Payer: Medicare Other | Attending: Neurology | Admitting: Physical Therapy

## 2016-11-24 DIAGNOSIS — R2681 Unsteadiness on feet: Secondary | ICD-10-CM | POA: Diagnosis not present

## 2016-11-24 DIAGNOSIS — Z9181 History of falling: Secondary | ICD-10-CM | POA: Insufficient documentation

## 2016-11-24 NOTE — Patient Instructions (Addendum)
Heel Raise: Bilateral (Standing)   Holding onto the kitchen counter Rise on balls of feet. Repeat _10___ times per set. Do __1__ sets per session. Do __2__ sessions per day.  http://orth.exer.us/38   Copyright  VHI. All rights reserved.  Toe Raise (Standing)   At the [employer] back on heels. Repeat _10___ times per set. Do ___1_ sets per session. Do _2___ sessions per day.  http://orth.exer.us/42   Copyright  VHI. All rights reserved.

## 2016-11-24 NOTE — Therapy (Signed)
Glenn [name], [place], [address] Phone: [phone]   Fax:  [phone]  Physical Therapy Evaluation  Patient Details  Name: Glenn [name] MRN: [id number] Date of Birth: 75-Aug-1942 Referring Provider: Dr. Asencion Partridge Dohmeier  Encounter Date: 11/24/2016      PT End of Session - 11/24/16 1218    Visit Number 1   Number of Visits 12   Date for PT Re-Evaluation 12/24/16   Authorization Type medicare   Authorization - Visit Number 1   Authorization - Number of Visits 12   PT Start Time 1100   PT Stop Time 1210   PT Time Calculation (min) 70 min   Activity Tolerance Patient tolerated treatment well   Behavior During Therapy [facility] for tasks assessed/performed      Past Medical History:  Diagnosis Date  . Back pain   . COPD (chronic obstructive pulmonary disease) ([place])   . GERD (gastroesophageal reflux disease)   . HTN (hypertension)   . Hyperlipidemia   . Hypogonadism in male   . Memory changes   . Stroke [facility])    2001 no residual symptom, 2013 no residual symptom  . Tobacco use     Past Surgical History:  Procedure Laterality Date  . BACK SURGERY     4 surgeries  . CARPAL TUNNEL RELEASE Bilateral    2 surger    There were no vitals filed for this visit.       Subjective Assessment - 11/24/16 1115    Subjective [name] states that he is having difficulty balancing himself.  He states that if he crosses the room he might lose his balance and fall into the wall.     Pertinent History CVA 2012, 2014, aphasic speech , COPD, onset dementia   Patient Stated Goals no more falling be more stable on his feet.    Currently in Pain? No/denies            [facility] PT Assessment - 11/24/16 0001      Assessment   Medical Diagnosis post concussion vertigo    Referring Provider Dr. Larey Seat   Next MD Visit 11/26/2016    Prior Therapy none     Precautions   Precautions None     Restrictions    Weight Bearing Restrictions No     Balance Screen   Has the patient fallen in the past 6 months Yes   How many times? 2   Has the patient had a decrease in activity level because of a fear of falling?  Yes   Is the patient reluctant to leave their home because of a fear of falling?  No     Home Environment   Living Environment Private residence   Type of [place] to enter   Entrance Stairs-Number of Steps 3   Home Layout Laundry or work area in basement     Prior Function   Level of [place] Retired   Leisure none     Cognition   Overall Cognitive Status Within Functional Limits for tasks assessed     Posture/Postural Control   Posture/Postural Control Postural limitations   Postural Limitations Flexed trunk     ROM / Strength   AROM / PROM / Strength Strength     Strength   Overall Strength Within functional limits for tasks performed     Balance   Balance Assessed Yes  Standardized Balance Assessment   Standardized Balance Assessment Berg Balance Test     Berg Balance Test   Sit to Stand Able to stand without using hands and stabilize independently   Standing Unsupported Able to stand 2 minutes with supervision   Sitting with Back Unsupported but Feet Supported on Floor or Stool Able to sit safely and securely 2 minutes   Stand to Sit Sits safely with minimal use of hands   Transfers Able to transfer safely, minor use of hands   Standing Unsupported with Eyes Closed Able to stand 10 seconds with supervision   Standing Ubsupported with Feet Together Able to place feet together independently but unable to hold for 30 seconds   From Standing, Reach Forward with Outstretched Arm Can reach forward >12 cm safely (5")   From Standing Position, Pick up Object from Floor Able to pick up shoe, needs supervision   From Standing Position, Turn to Look Behind Over each Shoulder Needs supervision when turning   Turn 360  Degrees Able to turn 360 degrees safely but slowly   Standing Unsupported, Alternately Place Feet on Step/Stool Able to complete >2 steps/needs minimal assist   Standing Unsupported, One Foot in Front Needs help to step but can hold 15 seconds   Standing on One Leg Tries to lift leg/unable to hold 3 seconds but remains standing independently   Total Score 36            Vestibular Assessment - 11/24/16 0001      Symptom Behavior   Type of Dizziness Imbalance   Aggravating Factors Not applicable     Occulomotor Exam   Occulomotor Alignment Normal   Smooth Pursuits Intact   Saccades Intact     Vestibulo-Occular Reflex   VOR 1 Head Only (x 1 viewing) none      Positional Testing   Dix-Hallpike Dix-Hallpike Right;Dix-Hallpike Left     Dix-Hallpike Right   Dix-Hallpike Right Symptoms Other (comment)  Pt dizzy difficult to assess due to pt moving around.       Dix-Hallpike Left   Dix-Hallpike Left Symptoms No nystagmus     Cognition   Cognition Orientation Level Oriented x 4     Positional Sensitivities   Right Hallpike Mild dizziness               [facility] Adult PT Treatment/Exercise - 11/24/16 0001      Ambulation/Gait   Ambulation/Gait Yes   Gait Comments gt train with cane pt needs further instruction.              Balance Exercises - 11/24/16 1215      Balance Exercises: Standing   Heel Raises Limitations 10   Other Standing Exercises toe raise 10            PT Education - 11/24/16 1208    Education provided Yes   Education Details hep; use of a cane   Person(s) Educated Patient   Methods Explanation   Comprehension Verbalized understanding;Need further instruction          PT Short Term Goals - 11/24/16 1216      PT SHORT TERM GOAL #1   Title Pt to be able to pick an item off the floor without losing his balance.    Time 3   Period Weeks   Status New     PT SHORT TERM GOAL #2   Title Pt to be able to demonstrate proper gt with  cane to prevent  pt from losing his balance.    Time 3   Period Weeks   Status New     PT SHORT TERM GOAL #3   Title Pt to be able to turn while walking without losing his balance.    Time 3   Period Weeks           PT Long Term Goals - Dec 22, 2016 1219      PT LONG TERM GOAL #1   Title Pt to be able to carry items in the house without dropping and or losing his balance   Time 6   Period Weeks   Status New     PT LONG TERM GOAL #2   Title Pt to be able to go up and down the basement steps without losing his balance   Time 6   Period Weeks   Status New     PT LONG TERM GOAL #3   Title Pt Berg score to be improved to a 49 to allow pt to go back to walking without an assistive device safely.    Time 6   Period Weeks   Status New     PT LONG TERM GOAL #4   Title Pt to report no falls since starting therapy   Time 6   Period Weeks   Status New               Plan - 12-22-16 1210    Clinical Impression Statement Mr. Matt is a 75 yo male who has had increased imbalance issues that has caused him to fall twice in the past seveal months.  He has been referred to physical therapy by his MD for vertigo but upon examination it appears that his issues are more balance related.  Mr. Pastorino has decreased activty tolerance, decreased balance and a history of falling.  He will benefit from skilled physcial therapy to address these issues and improve his safety.    Rehab Potential Fair   PT Frequency 2x / week   PT Duration 4 weeks   PT Treatment/Interventions ADLs/Self Care Home Management;Canalith Repostioning;Functional mobility training;Therapeutic activities;Balance training;Therapeutic exercise;Patient/family education   PT Next Visit Plan Review use of cane when ambulating.  Begin SLS, semitandem gt, narrow base of support on foam, tandem, retro and sidestepping progress balance activity as indicatied.    PT Home Exercise Plan heel/toe raise    Consulted and Agree with Plan  of Care Patient      Patient will benefit from skilled therapeutic intervention in order to improve the following deficits and impairments:  Decreased activity tolerance, Decreased balance, Decreased endurance, Decreased knowledge of use of DME, Decreased safety awareness, Dizziness  Visit Diagnosis: History of falling  Unsteadiness on feet      G-Codes - 12/22/16 1225    Functional Assessment Tool Used Berg balance test    Functional Limitation Changing and maintaining body position   Changing and Maintaining Body Position Current Status (K9326) At least 20 percent but less than 40 percent impaired, limited or restricted   Changing and Maintaining Body Position Goal Status (Z1245) At least 1 percent but less than 20 percent impaired, limited or restricted       Problem List Patient Active Problem List   Diagnosis Date Noted  . Excessive daytime sleepiness 08/09/2015  . CVA (cerebral vascular accident) ([place]) 08/09/2015  . COPD exacerbation ([place]) 06/10/2015  . Hemiparesis affecting right side as late effect of cerebrovascular accident ([place]) 06/10/2015  . Opioid dependence with  opioid-induced sleep disorder ([place]) 06/10/2015  . Impairment of visual perception 12/19/2013  . Lack of coordination 12/19/2013  . Muscle weakness (generalized) 12/19/2013  . Aphasia following cerebrovascular disease 12/11/2013  . Acute ischemic stroke ([place]) 11/03/2013  . Acute renal failure ([place]) 11/03/2013  . COPD (chronic obstructive pulmonary disease) ([place]) 08/06/2012  . Tobacco abuse 08/06/2012  . Memory difficulties 08/06/2012  . Hyperlipidemia 08/06/2012  . CVA (cerebral infarction) 08/05/2012  . HTN (hypertension) 08/05/2012  . CONSTIPATION 05/14/2009  . CHANGE IN BOWELS 05/14/2009  . COLONIC POLYPS, HX OF 05/14/2009  . SHOULDER, ARTHRITIS, DEGEN./OSTEO 03/14/2008  . SHOULDER PAIN 03/14/2008  . IMPINGEMENT SYNDROME 03/14/2008  . RUPTURE ROTATOR CUFF 03/14/2008    Rayetta Humphrey, PT  CLT [phone] 11/24/2016, 12:28 PM  [place] [address] [place], [place], [address] Phone: [phone]   Fax:  [phone]  Name: AYAD NIEMAN MRN: [id number] Date of Birth: 07-07-1941

## 2016-11-25 ENCOUNTER — Telehealth: Payer: Self-pay

## 2016-11-25 DIAGNOSIS — R413 Other amnesia: Secondary | ICD-10-CM

## 2016-11-25 NOTE — Telephone Encounter (Signed)
Pt's wife returned RN's call. Please call before 1pm , after that call [phone] (c)

## 2016-11-25 NOTE — Telephone Encounter (Signed)
I spoke to pt's wife, Glenn [name], per DPR. I advised her that Dr. Brett Fairy reviewed his MRI results which showed chronic stroke signs in the area that corresponds with a past stroke pt has suffered, but there have been no acute changes. Pt's wife verbalized understanding of results. Pt's wife plans to discuss with Jinny Blossom, NP tomorrow an increase in pt's namenda because she has not noticed a difference in the pt. Pt had no questions at this time but was encouraged to call back if questions arise. I reiterated the pt's appt time tomorrow and reminded them to be here by 10:15 am. Pt's wife verbalized understanding.

## 2016-11-25 NOTE — Telephone Encounter (Signed)
I called pt to discuss MRI results. No answer, left a message asking him to call me back.

## 2016-11-25 NOTE — Telephone Encounter (Signed)
-----   Message from Larey Seat, MD sent at 11/24/2016  5:50 PM EST ----- Chronic stroke signs in the area that corresponds with a stroke the patient suffered a 2014. No new acute changes. CD

## 2016-11-26 ENCOUNTER — Ambulatory Visit: Payer: Medicare Other | Admitting: Adult Health

## 2016-11-26 MED ORDER — MEMANTINE HCL 10 MG PO TABS: 10.0000 mg | ORAL_TABLET | Freq: Two times a day (BID) | ORAL | 5 refills | Status: DC

## 2016-11-26 NOTE — Telephone Encounter (Signed)
Pt's wife called and said that since the pt received MRI results yesterday, he does not not want to come in today to see Jinny Blossom, but wants to r/s his appt today. I advised pt's wife that this counts as a no-show because they are cancelling within 24 hours. I r/s pt with Megan for 12/17/16 at 2:30pm, and advised them to arrive no later than 2:15 pm.  Pt's wife wants to know if Dr. Brett Fairy will increase pt's namenda. He is currently on namenda 5mg  BID and pt's wife has not seen an improvement since pt started it on 10/29/2016.

## 2016-11-26 NOTE — Telephone Encounter (Signed)
I spoke to pt's wife and advised her that namenda was increased to 10mg  BID and it was sent to [facility]. Pt's wife verbalized understanding.

## 2016-11-26 NOTE — Telephone Encounter (Signed)
Changed to 10 mg bid. Namenda brand name was preferred on his formulary.

## 2016-11-26 NOTE — Addendum Note (Signed)
Addended by: Larey Seat on: 11/26/2016 10:10 AM   Modules accepted: Orders

## 2016-12-10 ENCOUNTER — Ambulatory Visit (HOSPITAL_COMMUNITY): Payer: Medicare Other

## 2016-12-15 ENCOUNTER — Ambulatory Visit (HOSPITAL_COMMUNITY): Payer: Medicare Other | Attending: Neurology

## 2016-12-15 DIAGNOSIS — Z9181 History of falling: Secondary | ICD-10-CM | POA: Diagnosis not present

## 2016-12-15 DIAGNOSIS — R2681 Unsteadiness on feet: Secondary | ICD-10-CM | POA: Diagnosis not present

## 2016-12-15 NOTE — Therapy (Signed)
[place], [place], [address] Phone: [phone]   Fax:  [phone]  Physical Therapy Treatment  Patient Details  Name: Glenn [name] MRN: [id number] Date of Birth: 05/09/1941 Referring Provider: Dr. Asencion Partridge Dohmeier  Encounter Date: 12/15/2016      PT End of Session - 12/15/16 0926    Visit Number 2   Number of Visits 12   Date for PT Re-Evaluation 12/24/16   Authorization Type medicare   Authorization - Visit Number 2   Authorization - Number of Visits 12   PT Start Time 0905   PT Stop Time 0947   PT Time Calculation (min) 42 min   Activity Tolerance Patient tolerated treatment well   Behavior During Therapy [facility] for tasks assessed/performed      Past Medical History:  Diagnosis Date  . Back pain   . COPD (chronic obstructive pulmonary disease) ([place])   . GERD (gastroesophageal reflux disease)   . HTN (hypertension)   . Hyperlipidemia   . Hypogonadism in male   . Memory changes   . Stroke [facility])    2001 no residual symptom, 2013 no residual symptom  . Tobacco use     Past Surgical History:  Procedure Laterality Date  . BACK SURGERY     4 surgeries  . CARPAL TUNNEL RELEASE Bilateral    2 surger    There were no vitals filed for this visit.      Subjective Assessment - 12/15/16 0909    Subjective Pt stated he has difficulty with current HEP, no reports of recent falls.  No reports of pain today.     Pertinent History CVA 2012, 2014, aphasic speech , COPD, onset dementia   Patient Stated Goals no more falling be more stable on his feet.    Currently in Pain? No/denies                         [facility] Adult PT Treatment/Exercise - 12/15/16 0001      Ambulation/Gait   Ambulation/Gait Yes   Ambulation/Gait Assistance 4: Min assist   Ambulation/Gait Assistance Details initial 3 point sequence able to reduce to 2 point; min A for safety with    Ambulation Distance (Feet) 552  Feet   Assistive device Straight cane     Exercises   Exercises Knee/Hip     Knee/Hip Exercises: Standing   Heel Raises Both;2 sets;10 reps   Heel Raises Limitations toe raises 10x             Balance Exercises - 12/15/16 0949      Balance Exercises: Standing   SLS Eyes open;3 reps   Tandem Gait 1 rep   Partial Tandem Stance Eyes open;Foam/compliant surface;3 reps;30 secs   Retro Gait 1 rep   Sidestepping 1 rep   Heel Raises Limitations 2 sets x 10 reps   Toe Raise Limitations 2 sets x 10 reps             PT Short Term Goals - 11/24/16 1216      PT SHORT TERM GOAL #1   Title Pt to be able to pick an item off the floor without losing his balance.    Time 3   Period Weeks   Status New     PT SHORT TERM GOAL #2   Title Pt to be able to demonstrate proper gt with cane to prevent pt from losing his balance.  Time 3   Period Weeks   Status New     PT SHORT TERM GOAL #3   Title Pt to be able to turn while walking without losing his balance.    Time 3   Period Weeks           PT Long Term Goals - 11/24/16 1219      PT LONG TERM GOAL #1   Title Pt to be able to carry items in the house without dropping and or losing his balance   Time 6   Period Weeks   Status New     PT LONG TERM GOAL #2   Title Pt to be able to go up and down the basement steps without losing his balance   Time 6   Period Weeks   Status New     PT LONG TERM GOAL #3   Title Pt Berg score to be improved to a 49 to allow pt to go back to walking without an assistive device safely.    Time 6   Period Weeks   Status New     PT LONG TERM GOAL #4   Title Pt to report no falls since starting therapy   Time 6   Period Weeks   Status New               Plan - 12/15/16 1409    Clinical Impression Statement Reviewed goals, reviewed form and technqiue with current HEP with pt and his daughter to assure correct form complete at home and copy of eval given to pt.  Pt educated  importance of ambulating with AD to reduce risk of falls.  Gait training complete with SPC with 3 then 2 point sequence and min A required for LOB safety.  Reviewed importance of improving self awareness with obstacles in floor and feeling chair behind LE prior sit to stand.  EOS pt limited by fatigue, no reoprts of pain through session.     Rehab Potential Fair   PT Frequency 2x / week   PT Duration 4 weeks   PT Treatment/Interventions ADLs/Self Care Home Management;Canalith Repostioning;Functional mobility training;Therapeutic activities;Balance training;Therapeutic exercise;Patient/family education   PT Next Visit Plan Continue education on importance use of cane when ambulating.   Continue balance training with SLS, semitandem gt, narrow base of support on foam, tandem, retro and sidestepping progress balance activity as indicatied.       Patient will benefit from skilled therapeutic intervention in order to improve the following deficits and impairments:  Decreased activity tolerance, Decreased balance, Decreased endurance, Decreased knowledge of use of DME, Decreased safety awareness, Dizziness  Visit Diagnosis: History of falling  Unsteadiness on feet     Problem List Patient Active Problem List   Diagnosis Date Noted  . Excessive daytime sleepiness 08/09/2015  . CVA (cerebral vascular accident) ([place]) 08/09/2015  . COPD exacerbation ([place]) 06/10/2015  . Hemiparesis affecting right side as late effect of cerebrovascular accident ([place]) 06/10/2015  . Opioid dependence with opioid-induced sleep disorder ([place]) 06/10/2015  . Impairment of visual perception 12/19/2013  . Lack of coordination 12/19/2013  . Muscle weakness (generalized) 12/19/2013  . Aphasia following cerebrovascular disease 12/11/2013  . Acute ischemic stroke ([place]) 11/03/2013  . Acute renal failure ([place]) 11/03/2013  . COPD (chronic obstructive pulmonary disease) ([place]) 08/06/2012  . Tobacco abuse 08/06/2012  . Memory  difficulties 08/06/2012  . Hyperlipidemia 08/06/2012  . CVA (cerebral infarction) 08/05/2012  . HTN (hypertension) 08/05/2012  . CONSTIPATION 05/14/2009  .  CHANGE IN BOWELS 05/14/2009  . COLONIC POLYPS, HX OF 05/14/2009  . SHOULDER, ARTHRITIS, DEGEN./OSTEO 03/14/2008  . SHOULDER PAIN 03/14/2008  . IMPINGEMENT SYNDROME 03/14/2008  . RUPTURE ROTATOR CUFF 03/14/2008   Ihor Austin, [place]; [place]  Aldona Lento 12/15/2016, 2:21 PM  [place], [place], [address] Phone: [phone]   Fax:  [phone]  Name: Glenn [name] MRN: [id number] Date of Birth: 1941/07/24

## 2016-12-17 ENCOUNTER — Telehealth: Payer: Self-pay | Admitting: *Deleted

## 2016-12-17 ENCOUNTER — Ambulatory Visit: Payer: Self-pay | Admitting: Adult Health

## 2016-12-17 NOTE — Telephone Encounter (Signed)
Reschedule in [place]

## 2016-12-17 NOTE — Telephone Encounter (Signed)
I called and spoke to wife re: upcoming appt this afternoon per MM/NP request.   If pt doing ok, and since had received MRI results we can delay appt.   I spoke to wife.  She stated that pt is doing well.  No SE from increase of his namenda 10mg  po BID.  She had been given MRI results already.   She was ok to cancel today's appt.  We will see about rescheduling.

## 2016-12-23 NOTE — Telephone Encounter (Signed)
LMVM for pts wife to return call and reschedule appt with MM/NP for sometime in February.

## 2016-12-29 ENCOUNTER — Ambulatory Visit (HOSPITAL_COMMUNITY): Payer: Medicare Other

## 2016-12-29 ENCOUNTER — Telehealth (HOSPITAL_COMMUNITY): Payer: Self-pay

## 2016-12-29 NOTE — Telephone Encounter (Signed)
Pt was on the way and decided he did not want to come today per Otila Kluver.

## 2016-12-30 NOTE — Telephone Encounter (Signed)
LMVM for pt / wife to call back to reschedule appt in February.  (#2).

## 2016-12-31 ENCOUNTER — Encounter: Payer: Self-pay | Admitting: *Deleted

## 2016-12-31 NOTE — Telephone Encounter (Signed)
Mailed letter to home address.

## 2017-01-05 ENCOUNTER — Telehealth (HOSPITAL_COMMUNITY): Payer: Self-pay | Admitting: Physical Therapy

## 2017-01-05 ENCOUNTER — Ambulatory Visit (HOSPITAL_COMMUNITY): Payer: Medicare Other | Attending: Neurology | Admitting: Physical Therapy

## 2017-01-05 NOTE — Telephone Encounter (Signed)
Called pt re missed appointment.  Pt does not have another appointment scheduled.  No answer, left a message for pt to call department and make another appointment.   Rayetta Humphrey, [place] CLT [phone]

## 2017-02-11 ENCOUNTER — Encounter: Payer: Self-pay | Admitting: Adult Health

## 2017-02-11 ENCOUNTER — Ambulatory Visit (INDEPENDENT_AMBULATORY_CARE_PROVIDER_SITE_OTHER): Payer: Medicare Other | Admitting: Adult Health

## 2017-02-11 VITALS — BP 119/61 | HR 51 | Ht 68.5 in | Wt 147.2 lb

## 2017-02-11 DIAGNOSIS — G4730 Sleep apnea, unspecified: Secondary | ICD-10-CM

## 2017-02-11 DIAGNOSIS — R270 Ataxia, unspecified: Secondary | ICD-10-CM

## 2017-02-11 DIAGNOSIS — F0390 Unspecified dementia without behavioral disturbance: Secondary | ICD-10-CM | POA: Diagnosis not present

## 2017-02-11 NOTE — Progress Notes (Signed)
PATIENT: Glenn [name] DOB: [birth year]  REASON FOR VISIT: follow up- dementia, insomnia, ataxia, postconcussion syndrome HISTORY FROM: patient  HISTORY OF PRESENT ILLNESS: Today February 11 2017 Glenn [name] is a 76 year old male with a history of dementia, insomnia, ataxia and postconcussion syndrome. He returns today for follow-up. The patient is currently on Namenda 10 mg twice a day. He lives at home with his wife. He is able to complete most ADLs independently. He does not operate a motor vehicle. His wife prepares all meals. She also handles the finances. He states that most of this occurred once he had stroke. He states that he does have trouble sleeping. He frequently wakes up during the night. He does have obstructive sleep apnea however refuses to use CPAP machine. The patient is no longer using Ambien at bedtime. He is using an over-the-counter medication. Denies hallucinations. Reports he has a good appetite. Denies any significant changes in his mood or behavior. His wife reports that occasionally when he gets frustrated he will use curse words whereas prior to his stroke he would not have done this. He returns today for an evaluation.  HISTORY per Dr. Edwena Felty notes: Glenn [name] is a 76 y.o. male   seen here as a referralt  from Dr. Gerarda Fraction for  A sleep consultation , with additional concerns of memory loss.   06-10-2015 Glenn [name] suffered a stroke about 18 months ago and since then has a hesitant aphasic speech. He has been a smoker all his adult life and has not been able to kick the habit as of yet. The sleep consultation came about because the patient has hypertension had a previous stroke and has COPD he also has always snored, and his wife hears him in the next room.  Dr. Riley Kill ordered a overnight pulse oximetry test. This revealed that the patient stayed at or below 89% oxygenation for 7 hours and 24 minutes the equivalent of 85% of the tested time area interestingly as the  night progressed his oxygen saturation actually improved. This is the opposite from what I would expect if the patient has fatigable breathing. His cardiac function however bradycardia and tachycardia increased with the ongoing night. His pulse summary very between 96 bpm and 44 bpm. This test was performed on the night 4-20 2-16 and in [facility] 9 hours and 9 minutes of recording time. The oxygen nadir was 78%. Average duration for a desaturation was 55 seconds, which is prolonged.He has trouble to form full sentences, as he gives his history.  He goes to bed at 9 Pm and gets up at 5 AM. He sleeps alone. His bedroom is cool, quiet and dark, He wake up 3 times on average for nocturia, and he usually has a cigarette before he returns to be. He drinks a couple of Colas each day and 2 cups of coffee in the AM. He doesn't drink alcohol. He is on chronic narcotic pain medication- oxycodone 10 mg 3 times a day. He may suffer form reduced breathing drive , central apneas. He takes 3 doses, the last at 6 PM.   He has COPD and chronic pain. Is on Ambien and on Lunesta, too . She was diagnosed with a milder degree of renal failure and was changed from hydrocodone to oxycodone in response to the diagnosis. He is on pro-air, albuterol, amlodipine, Plavix aspirin, oxycodone, Lunesta, Ambien as needed, Nexium, Zocor, Norvasc. He had the first TIA and 2001 he had a stroke in 2012 and  then in November 2014 had a major stroke but only at the symptoms of confusion. There was no motor deficit noticed and his speech had initially not changed. No concussion , TBI.   Interval history from Nov 16, 202017. It has been over a year since we last have seen Glenn [name] here in the sleep clinic. He is a patient with a history of stroke, short-term memory loss, hypertension, COPD, active tobacco use, hyperlipidemia back pain and acid reflux. He was diagnosed on 06/19/2015 with an AHI of 28.7 and titrated to 10 cm water pressure. As the  titration was not complete he returned for a full night titration on 07/31/2015, the patient responded not to CPAP and BiPAP with a ST function, pacing his breathing. He was also fitted to a full face mask. He had arrhythmia by EKG, sleep fragmentation, periodic limb movements, and under a as the reached an AHI of 2.9. In the meantime his family is concerned that his memory has progressed to decline but he also has fallen about 14 days ago and hit his head. He had no previous history of traumatic brain injuries or concussions. He had seen his primary care physician, Dr. Gerarda Fraction, but had no CT scan. The fall happened on a staircase in his own home after the lights had failed. It occurred in the dark.  He bled, he did not feel nausea, there was no loss of consciousness according to him, but retrograde amnesia.  This may be the explanation for his fall. But he feels also that he is more unsteady just walking on uneven surface and sometimes takes an extra step to secure his posture-  He feels as if he is pushed from the side or pulled,  drifts to the side , but not forward or backward  REVIEW OF SYSTEMS: Out of a complete 14 system review of symptoms, the patient complains only of the following symptoms, and all other reviewed systems are negative.  Insomnia, frequent waking, wheezing, shortness of breath, speech difficulty  ALLERGIES: No Known Allergies  HOME MEDICATIONS: Outpatient Medications Prior to Visit  Medication Sig Dispense Refill  . albuterol (PROVENTIL HFA;VENTOLIN HFA) 108 (90 BASE) MCG/ACT inhaler Inhale 2 puffs into the lungs every 6 (six) hours as needed for wheezing or shortness of breath.    Marland Kitchen albuterol (PROVENTIL) 2 MG tablet Take 2 mg by mouth 3 (three) times daily.    Marland Kitchen amLODipine (NORVASC) 10 MG tablet Take 10 mg by mouth daily.    Marland Kitchen aspirin 325 MG EC tablet Take 1 tablet (325 mg total) by mouth daily. 30 tablet 0  . atorvastatin (LIPITOR) 20 MG tablet Take 1 tablet (20 mg  total) by mouth daily at 6 PM. 30 tablet 0  . buPROPion (WELLBUTRIN) 100 MG tablet 100 mg 3 (three) times daily.    . Cholecalciferol (VITAMIN D-3) 1000 UNITS CAPS Take 1 capsule by mouth daily. 2000 units    . clopidogrel (PLAVIX) 75 MG tablet Take 1 tablet (75 mg total) by mouth daily with breakfast. 30 tablet 0  . esomeprazole (NEXIUM) 40 MG capsule Take 40 mg by mouth daily at 12 noon.    Marland Kitchen losartan (COZAAR) 100 MG tablet 100 mg daily.    . memantine (NAMENDA) 10 MG tablet Take 1 tablet (10 mg total) by mouth 2 (two) times daily. 60 tablet 5  . mirtazapine (REMERON) 15 MG tablet 15 mg at bedtime.    . mupirocin ointment (BACTROBAN) 2 % Place 1 application into the nose 3 (  three) times daily as needed.    . Oxycodone HCl 10 MG TABS Take 10 mg by mouth 3 (three) times daily as needed. For pain     No facility-administered medications prior to visit.     PAST MEDICAL HISTORY: Past Medical History:  Diagnosis Date  . Back pain   . COPD (chronic obstructive pulmonary disease) ([place])   . GERD (gastroesophageal reflux disease)   . HTN (hypertension)   . Hyperlipidemia   . Hypogonadism in male   . Memory changes   . Stroke [facility])    2001 no residual symptom, 2013 no residual symptom  . Tobacco use     PAST SURGICAL HISTORY: Past Surgical History:  Procedure Laterality Date  . BACK SURGERY     4 surgeries  . CARPAL TUNNEL RELEASE Bilateral    2 surger    FAMILY HISTORY: Family History  Problem Relation Age of Onset  . Other Father   . High blood pressure Maternal Grandmother   . High blood pressure Maternal Grandfather   . Stroke Maternal Aunt   . Stroke Maternal Uncle   . Diabetes Neg Hx   . Cancer Neg Hx     SOCIAL HISTORY: Social History   Social History  . Marital status: Married    Spouse name: N/A  . Number of children: N/A  . Years of education: N/A   Occupational History  . Not on file.   Social History Main Topics  . Smoking status: Current Every Day  Smoker    Packs/day: 1.50    Years: 50.00  . Smokeless tobacco: Never Used  . Alcohol use No  . Drug use: Yes    Types: Oxycodone  . Sexual activity: Yes   Other Topics Concern  . Not on file   Social History Narrative   Lives with his wife and three boys.  Some stairs in the house.  Does not need any assist devices.        Drinks 4 cups of caffeine daily.      PHYSICAL EXAM  Vitals:   02/11/17 1035  BP: 119/61  Pulse: (!) 51  Weight: 147 lb 3.2 oz (66.8 kg)  Height: 5' 8.5" (1.74 m)   Body mass index is 22.06 kg/m.  MMSE - Mini Mental State Exam 02/11/2017 10/29/2016  Orientation to time 1 2  Orientation to Place 3 4  Registration 3 3  Attention/ Calculation 3 2  Recall 2 0  Language- name 2 objects 2 2  Language- repeat 1 1  Language- follow 3 step command 3 3  Language- read & follow direction 1 1  Write a sentence 0 0  Copy design 1 0  Total score 20 18     Generalized: Well developed, in no acute distress   Neurological examination  Mentation: Alert. Follows all commands. Mild aphasia noted. Cranial nerve II-XII: Pupils were equal round reactive to light. Extraocular movements were full, visual field were full on confrontational test. Facial sensation and strength were normal. Uvula tongue midline. Head turning and shoulder shrug  were normal and symmetric. Motor: The motor testing reveals 5 over 5 strength of all 4 extremities. Good symmetric motor tone is noted throughout.  Sensory: Sensory testing is intact to soft touch on all 4 extremities. No evidence of extinction is noted.  Coordination: Cerebellar testing reveals good finger-nose-finger and heel-to-shin bilaterally.  Gait and station: Gait is slightly abnormal. Mild ataxia noted.. Tandem gait is unsteady. Romberg is negative but unsteady.  Reflexes: Deep tendon reflexes are symmetric and normal bilaterally.   DIAGNOSTIC DATA (LABS, IMAGING, TESTING) - I reviewed patient records, labs, notes,  testing and imaging myself where available.  Lab Results  Component Value Date   WBC 10.1 11/03/2013   HGB 14.3 11/03/2013   HCT 41.8 11/03/2013   MCV 82.9 11/03/2013   PLT 242 11/03/2013      Component Value Date/Time   NA 137 11/03/2013 1145   K 4.1 11/03/2013 1145   CL 102 11/03/2013 1145   CO2 25 11/03/2013 1145   GLUCOSE 101 (H) 11/03/2013 1145   BUN 18 11/03/2013 1145   CREATININE 1.38 (H) 11/03/2013 1145   CREATININE 1.14 02/17/2012 0925   CALCIUM 9.2 11/03/2013 1145   PROT 7.0 11/03/2013 1145   ALBUMIN 3.3 (L) 11/03/2013 1145   AST 28 11/03/2013 1145   ALT 13 11/03/2013 1145   ALKPHOS 84 11/03/2013 1145   BILITOT 0.4 11/03/2013 1145   GFRNONAA 50 (L) 11/03/2013 1145   GFRAA 57 (L) 11/03/2013 1145   Lab Results  Component Value Date   CHOL 215 (H) 11/03/2013   HDL 36 (L) 11/03/2013   LDLCALC 147 (H) 11/03/2013   TRIG 161 (H) 11/03/2013   CHOLHDL 6.0 11/03/2013   Lab Results  Component Value Date   HGBA1C 6.1 (H) 11/03/2013   No results found for: [id number] Lab Results  Component Value Date   TSH 3.184 11/03/2013      ASSESSMENT AND PLAN 76 y.o. year old male  has a past medical history of Back pain; COPD (chronic obstructive pulmonary disease) ([place]); GERD (gastroesophageal reflux disease); HTN (hypertension); Hyperlipidemia; Hypogonadism in male; Memory changes; Stroke ([place]); and Tobacco use. here with:  1. Memory disturbance 2. Ataxia 3. Sleep apnea  Patient's memory score has remained stable. He will continue on Namenda 10 mg twice a day. The patient is encouraged to use an assistive device when ambulating. The patient still continues to have daytime sleepiness and frequent waking at night. Advised that this is due to untreated sleep apnea. The patient does not want to use a CPAP machine. Advised that if his symptoms worsen or he develops new symptoms he should let us know. Will follow-up in 6 months or sooner if needed.    Ward Givens, MSN,  NP-C 02/11/2017, 10:49 AM [facility] [address] [place], [place] [address] [phone]

## 2017-02-11 NOTE — Progress Notes (Signed)
I agree with the assessment and plan as directed by NP .The patient is known to me .   Kimberley Speece, MD  

## 2017-02-11 NOTE — Patient Instructions (Signed)
Memory score is stable Continue Namenda 10 mg twice a day If your symptoms worsen or you develop new symptoms please let us know.

## 2017-03-14 ENCOUNTER — Encounter (HOSPITAL_COMMUNITY): Payer: Self-pay

## 2017-03-14 ENCOUNTER — Emergency Department (HOSPITAL_COMMUNITY)
Admission: EM | Admit: 2017-03-14 | Discharge: 2017-03-14 | Disposition: A | Discharge status: Home / Self Care | Payer: Medicare Other | Attending: Emergency Medicine | Admitting: Emergency Medicine

## 2017-03-14 DIAGNOSIS — R253 Fasciculation: Secondary | ICD-10-CM | POA: Diagnosis not present

## 2017-03-14 DIAGNOSIS — Z79899 Other long term (current) drug therapy: Secondary | ICD-10-CM | POA: Diagnosis not present

## 2017-03-14 DIAGNOSIS — F17210 Nicotine dependence, cigarettes, uncomplicated: Secondary | ICD-10-CM | POA: Insufficient documentation

## 2017-03-14 DIAGNOSIS — J441 Chronic obstructive pulmonary disease with (acute) exacerbation: Secondary | ICD-10-CM | POA: Insufficient documentation

## 2017-03-14 DIAGNOSIS — R251 Tremor, unspecified: Secondary | ICD-10-CM | POA: Diagnosis not present

## 2017-03-14 DIAGNOSIS — I10 Essential (primary) hypertension: Secondary | ICD-10-CM | POA: Diagnosis not present

## 2017-03-14 DIAGNOSIS — Z7982 Long term (current) use of aspirin: Secondary | ICD-10-CM | POA: Diagnosis not present

## 2017-03-14 DIAGNOSIS — F0390 Unspecified dementia without behavioral disturbance: Secondary | ICD-10-CM | POA: Insufficient documentation

## 2017-03-14 DIAGNOSIS — N289 Disorder of kidney and ureter, unspecified: Secondary | ICD-10-CM | POA: Diagnosis not present

## 2017-03-14 DIAGNOSIS — I6789 Other cerebrovascular disease: Secondary | ICD-10-CM | POA: Diagnosis not present

## 2017-03-14 LAB — COMPREHENSIVE METABOLIC PANEL
ALT: 17 U/L (ref 17–63)
ANION GAP: 6 (ref 5–15)
AST: 22 U/L (ref 15–41)
Albumin: 3.5 g/dL (ref 3.5–5.0)
Alkaline Phosphatase: 78 U/L (ref 38–126)
BILIRUBIN TOTAL: 0.3 mg/dL (ref 0.3–1.2)
BUN: 38 mg/dL — ABNORMAL HIGH (ref 6–20)
CO2: 21 mmol/L — ABNORMAL LOW (ref 22–32)
Calcium: 8.8 mg/dL — ABNORMAL LOW (ref 8.9–10.3)
Chloride: 110 mmol/L (ref 101–111)
Creatinine, Ser: 2.24 mg/dL — ABNORMAL HIGH (ref 0.61–1.24)
GFR, EST AFRICAN AMERICAN: 31 mL/min — AB (ref >60)
GFR, EST NON AFRICAN AMERICAN: 27 mL/min — AB (ref >60)
Glucose, Bld: 118 mg/dL — ABNORMAL HIGH (ref 65–99)
POTASSIUM: 5.2 mmol/L — AB (ref 3.5–5.1)
Sodium: 137 mmol/L (ref 135–145)
Total Protein: 7 g/dL (ref 6.5–8.1)

## 2017-03-14 LAB — CBC WITH DIFFERENTIAL/PLATELET
Basophils Absolute: 0.1 10*3/uL (ref 0.0–0.1)
Basophils Relative: 0 %
Eosinophils Absolute: 0.4 10*3/uL (ref 0.0–0.7)
Eosinophils Relative: 3 %
HCT: 39.3 % (ref 39.0–52.0)
HEMOGLOBIN: 13.5 g/dL (ref 13.0–17.0)
LYMPHS ABS: 1.9 10*3/uL (ref 0.7–4.0)
LYMPHS PCT: 16 %
MCH: 29.6 pg (ref 26.0–34.0)
MCHC: 34.4 g/dL (ref 30.0–36.0)
MCV: 86.2 fL (ref 78.0–100.0)
Monocytes Absolute: 0.8 10*3/uL (ref 0.1–1.0)
Monocytes Relative: 7 %
NEUTROS ABS: 9.2 10*3/uL — AB (ref 1.7–7.7)
NEUTROS PCT: 74 %
Platelets: 330 10*3/uL (ref 150–400)
RBC: 4.56 MIL/uL (ref 4.22–5.81)
RDW: 13.4 % (ref 11.5–15.5)
WBC: 12.5 10*3/uL — ABNORMAL HIGH (ref 4.0–10.5)

## 2017-03-14 LAB — TSH: TSH: 2.637 u[IU]/mL (ref 0.350–4.500)

## 2017-03-14 LAB — ACETAMINOPHEN LEVEL: Acetaminophen (Tylenol), Serum: <10 ug/mL — ABNORMAL LOW (ref 10–30)

## 2017-03-14 NOTE — Discharge Instructions (Signed)
Call Dr.Dohmeier's office tomorrow to schedule appointment for this week. Tell office staff that you were seen here when scheduling the appointment.. We will also like you to call Dr. Nolon Rod office tomorrow, as Mr.Cliff's kidney function is worse today than our last values from 3 years ago. Tell Dr. Gerarda Fraction that Mr. Rosencrans blood creatinine today was 2.24. BUN was 38. Dr. Gerarda Fraction may have lab work results in his office to compare. He may want to see Mr. Hartig in the office. Return here if his condition worsens for any reason or if concerned

## 2017-03-14 NOTE — ED Provider Notes (Signed)
[name] Provider Note   CSN: [id number] Arrival date & time: 03/14/17  1752     History   Chief Complaint Chief Complaint  Patient presents with  . Shaking    HPI Glenn [name] is a 76 y.o. male.Level V caveat dementia History is obtained from patient's wife HPI Patient developed twitching of his face today. Twitching is intermittent and lasts a few seconds at a time. He's had no change in speech. No loss of consciousness. No change in gait. He denies pain anywhere. His wife reports that last week he accidentally took too much of his blood pressure medication and Percocet. Past Medical History:  Diagnosis Date  . Back pain   . COPD (chronic obstructive pulmonary disease) ([place])   . GERD (gastroesophageal reflux disease)   . HTN (hypertension)   . Hyperlipidemia   . Hypogonadism in male   . Memory changes   . Stroke [facility])    2001 no residual symptom, 2013 no residual symptom  . Tobacco use     Patient Active Problem List   Diagnosis Date Noted  . Excessive daytime sleepiness 08/09/2015  . CVA (cerebral vascular accident) ([place]) 08/09/2015  . COPD exacerbation ([place]) 06/10/2015  . Hemiparesis affecting right side as late effect of cerebrovascular accident ([place]) 06/10/2015  . Opioid dependence with opioid-induced sleep disorder ([place]) 06/10/2015  . Impairment of visual perception 12/19/2013  . Lack of coordination 12/19/2013  . Muscle weakness (generalized) 12/19/2013  . Aphasia following cerebrovascular disease 12/11/2013  . Acute ischemic stroke ([place]) 11/03/2013  . Acute renal failure ([place]) 11/03/2013  . COPD (chronic obstructive pulmonary disease) ([place]) 08/06/2012  . Tobacco abuse 08/06/2012  . Memory difficulties 08/06/2012  . Hyperlipidemia 08/06/2012  . CVA (cerebral infarction) 08/05/2012  . HTN (hypertension) 08/05/2012  . CONSTIPATION 05/14/2009  . CHANGE IN BOWELS 05/14/2009  . COLONIC POLYPS, HX OF 05/14/2009  . SHOULDER, ARTHRITIS,  DEGEN./OSTEO 03/14/2008  . SHOULDER PAIN 03/14/2008  . IMPINGEMENT SYNDROME 03/14/2008  . RUPTURE ROTATOR CUFF 03/14/2008    Past Surgical History:  Procedure Laterality Date  . BACK SURGERY     4 surgeries  . CARPAL TUNNEL RELEASE Bilateral    2 surger       Home Medications    Prior to Admission medications   Medication Sig Start Date End Date Taking? Authorizing Provider  albuterol (PROVENTIL HFA;VENTOLIN HFA) 108 (90 BASE) MCG/ACT inhaler Inhale 2 puffs into the lungs every 6 (six) hours as needed for wheezing or shortness of breath.    Historical Provider, MD  albuterol (PROVENTIL) 2 MG tablet Take 2 mg by mouth 3 (three) times daily.    Historical Provider, MD  amLODipine (NORVASC) 10 MG tablet Take 10 mg by mouth daily.    Historical Provider, MD  aspirin 325 MG EC tablet Take 1 tablet (325 mg total) by mouth daily. 11/04/13   Janece Canterbury, MD  atorvastatin (LIPITOR) 20 MG tablet Take 1 tablet (20 mg total) by mouth daily at 6 PM. 11/04/13   Janece Canterbury, MD  buPROPion (WELLBUTRIN) 100 MG tablet 100 mg 3 (three) times daily. 10/01/16   Historical Provider, MD  Cholecalciferol (VITAMIN D-3) 1000 UNITS CAPS Take 1 capsule by mouth daily. 2000 units    Historical Provider, MD  clopidogrel (PLAVIX) 75 MG tablet Take 1 tablet (75 mg total) by mouth daily with breakfast. 11/04/13   Janece Canterbury, MD  losartan (COZAAR) 100 MG tablet 100 mg daily. 10/13/16   Historical Provider, MD  memantine (  NAMENDA) 10 MG tablet Take 1 tablet (10 mg total) by mouth 2 (two) times daily. 11/26/16   Asencion Partridge Dohmeier, MD  mirtazapine (REMERON) 15 MG tablet 15 mg at bedtime. 08/24/16   Historical Provider, MD  mupirocin ointment (BACTROBAN) 2 % Place 1 application into the nose 3 (three) times daily as needed.    Historical Provider, MD  omeprazole (PRILOSEC) 40 MG capsule Take 40 mg by mouth daily.  01/16/17   Historical Provider, MD  Oxycodone HCl 10 MG TABS Take 10 mg by mouth 3 (three) times daily  as needed. For pain    Historical Provider, MD    Family History Family History  Problem Relation Age of Onset  . Other Father   . High blood pressure Maternal Grandmother   . High blood pressure Maternal Grandfather   . Stroke Maternal Aunt   . Stroke Maternal Uncle   . Diabetes Neg Hx   . Cancer Neg Hx     Social History Social History  Substance Use Topics  . Smoking status: Current Every Day Smoker    Packs/day: 1.50    Years: 50.00    Types: Cigarettes  . Smokeless tobacco: Never Used  . Alcohol use No     Allergies   Patient has no known allergies.   Review of Systems Review of Systems  Unable to perform ROS: Dementia  Musculoskeletal: Positive for gait problem.       Unsteady gait for several months  Neurological: Positive for tremors.     Physical Exam Updated Vital Signs BP 139/78 (BP Location: Left Arm)   Pulse 71   Temp 97.5 F (36.4 C) (Oral)   Resp 17   Ht 5\' 10"  (1.778 m)   Wt 158 lb (71.7 kg)   SpO2 99%   BMI 22.67 kg/m   Physical Exam  Constitutional:  Chronically ill-appearing  HENT:  Head: Normocephalic and atraumatic.  Eyes: Conjunctivae are normal. Pupils are equal, round, and reactive to light.  Neck: Neck supple. No tracheal deviation present. No thyromegaly present.  Cardiovascular: Normal rate and regular rhythm.   No murmur heard. Pulmonary/Chest: Effort normal and breath sounds normal.  Abdominal: Soft. Bowel sounds are normal. He exhibits no distension. There is no tenderness.  Musculoskeletal: Normal range of motion. He exhibits no edema or tenderness.  Neurological: He is alert. Coordination normal.  Odorous strength 5 over 5 overall. Gait slightly unsteady. Walks unassisted DTRs symmetric bilaterally at knee jerk ankle jerk and biceps toes downward going bilaterally. Patient has tremors of his mouth which does not appear to be tonic-clonic in nature  Skin: Skin is warm and dry. No rash noted.  Psychiatric: He has a  normal mood and affect.  Nursing note and vitals reviewed.   Results for orders placed or performed during the hospital encounter of 03/14/17  Comprehensive metabolic panel  Result Value Ref Range   Sodium 137 135 - 145 mmol/L   Potassium 5.2 (H) 3.5 - 5.1 mmol/L   Chloride 110 101 - 111 mmol/L   CO2 21 (L) 22 - 32 mmol/L   Glucose, Bld 118 (H) 65 - 99 mg/dL   BUN 38 (H) 6 - 20 mg/dL   Creatinine, Ser 2.24 (H) 0.61 - 1.24 mg/dL   Calcium 8.8 (L) 8.9 - 10.3 mg/dL   Total Protein 7.0 6.5 - 8.1 g/dL   Albumin 3.5 3.5 - 5.0 g/dL   AST 22 15 - 41 U/L   ALT 17 17 - 63 U/L  Alkaline Phosphatase 78 38 - 126 U/L   Total Bilirubin 0.3 0.3 - 1.2 mg/dL   GFR calc non Af Amer 27 (L) >60 mL/min   GFR calc Af Amer 31 (L) >60 mL/min   Anion gap 6 5 - 15  CBC with Differential/Platelet  Result Value Ref Range   WBC 12.5 (H) 4.0 - 10.5 K/uL   RBC 4.56 4.22 - 5.81 MIL/uL   Hemoglobin 13.5 13.0 - 17.0 g/dL   HCT 39.3 39.0 - 52.0 %   MCV 86.2 78.0 - 100.0 fL   MCH 29.6 26.0 - 34.0 pg   MCHC 34.4 30.0 - 36.0 g/dL   RDW 13.4 11.5 - 15.5 %   Platelets 330 150 - 400 K/uL   Neutrophils Relative % 74 %   Neutro Abs 9.2 (H) 1.7 - 7.7 K/uL   Lymphocytes Relative 16 %   Lymphs Abs 1.9 0.7 - 4.0 K/uL   Monocytes Relative 7 %   Monocytes Absolute 0.8 0.1 - 1.0 K/uL   Eosinophils Relative 3 %   Eosinophils Absolute 0.4 0.0 - 0.7 K/uL   Basophils Relative 0 %   Basophils Absolute 0.1 0.0 - 0.1 K/uL  Acetaminophen level  Result Value Ref Range   Acetaminophen (Tylenol), Serum <10 (L) 10 - 30 ug/mL  TSH  Result Value Ref Range   TSH 2.637 0.350 - 4.500 uIU/mL   No results found.  ED Treatments / Results  Labs (all labs ordered are listed, but only abnormal results are displayed) Labs Reviewed - No data to display  EKG  EKG Interpretation None       Radiology No results found.  Procedures Procedures (including critical care time)  Medications Ordered in ED Medications - No data  to display  8:30 PM patient's exam is unchanged he remains alert appears comfortable continues to have tremors.   Initial Impression / Assessment and Plan / ED Course  I have reviewed the triage vital signs and the nursing notes.  Pertinent labs & imaging results that were available during my care of the patient were reviewed by me and considered in my medical decision making (see chart for details).    Plan I've advised patient and his wife of renal insufficiency and asked them to follow up with Dr.Fusco to check and see if he has more recent lab work than 3 years ago.  The wife has called the on-call neurologist at [facility] and has been advised that she'll be contacted by patient's neurologist Dr.Dohmeier. I've asked patient's wife to call tomorrow to schedule appointment for this week   Final Clinical Impressions(s) / ED Diagnoses  Diagnoses #1 tremors  #2 renal insufficiency  Final diagnoses:  None    New Prescriptions New Prescriptions   No medications on file     Orlie Dakin, MD 03/14/17 2037

## 2017-03-14 NOTE — ED Triage Notes (Signed)
Patient reports of twitching that started in face this morning. Denies any pain. No stoke like symptoms noted.

## 2017-03-15 ENCOUNTER — Telehealth: Payer: Self-pay | Admitting: Neurology

## 2017-03-15 DIAGNOSIS — T50901A Poisoning by unspecified drugs, medicaments and biological substances, accidental (unintentional), initial encounter: Secondary | ICD-10-CM

## 2017-03-15 NOTE — Telephone Encounter (Signed)
The daughter called yesterday, the patient has had facial twitching on both sides of the face, no loss of consciousness. The patient had taken too much blood pressure medication and Percocet.  The patient is gone to the emergency room, they have recommended an EEG study.

## 2017-03-15 NOTE — Addendum Note (Signed)
Addended by: Larey Seat on: 03/15/2017 05:15 PM   Modules accepted: Orders

## 2017-03-15 NOTE — Telephone Encounter (Signed)
Wife has taken over medication management- he has taken probably 4-5 doses of pain meds and HTN medication,  As very sleepy. he is back to normal today. EEG needed . Patient sees MM, NP at 13.00 hours on Wednesday.

## 2017-03-17 ENCOUNTER — Ambulatory Visit (INDEPENDENT_AMBULATORY_CARE_PROVIDER_SITE_OTHER): Payer: Medicare Other | Admitting: Adult Health

## 2017-03-17 ENCOUNTER — Encounter: Payer: Self-pay | Admitting: Adult Health

## 2017-03-17 VITALS — BP 144/56 | HR 68 | Ht 70.0 in | Wt 142.6 lb

## 2017-03-17 DIAGNOSIS — F0390 Unspecified dementia without behavioral disturbance: Secondary | ICD-10-CM | POA: Diagnosis not present

## 2017-03-17 DIAGNOSIS — T50901A Poisoning by unspecified drugs, medicaments and biological substances, accidental (unintentional), initial encounter: Secondary | ICD-10-CM

## 2017-03-17 NOTE — Progress Notes (Signed)
PATIENT: Glenn [name] DOB: 11-Feb-1941  REASON FOR VISIT: follow up- dementia HISTORY FROM: patient  HISTORY OF PRESENT ILLNESS: Glenn [name] is a 76 year old male with a history of dementia, insomnia, ataxia and postconcussion syndrome. He returns today for follow-up. He remains on Namenda 10 mg twice a day. The primary reason for his return today is he went to the emergency room on March 18 due to twitching of the face. At that time his wife reported that he actually took too much of his blood pressure medication and Percocet. The patient's wife spoke to Dr. Brett Fairy and an EEG was ordered. They will get that scheduled today. Wife reports that the facial twitching has resolved. She states that he has returned to his baseline. She still notices some daytime sleepiness but overall the patient has returned to normal. She is now managing his medications and ensuring that he takes them correctly. They have a follow-up with his primary care provider March 27.  Update: February 11 2017 Glenn [name] is a 76 year old male with a history of dementia, insomnia, ataxia and postconcussion syndrome. He returns today for follow-up. The patient is currently on Namenda 10 mg twice a day. He lives at home with his wife. He is able to complete most ADLs independently. He does not operate a motor vehicle. His wife prepares all meals. She also handles the finances. He states that most of this occurred once he had stroke. He states that he does have trouble sleeping. He frequently wakes up during the night. He does have obstructive sleep apnea however refuses to use CPAP machine. The patient is no longer using Ambien at bedtime. He is using an over-the-counter medication. Denies hallucinations. Reports he has a good appetite. Denies any significant changes in his mood or behavior. His wife reports that occasionally when he gets frustrated he will use curse words whereas prior to his stroke he would not have done this. He  returns today for an evaluation.  HISTORY per Dr. Edwena Felty notes: Glenn [name] a 76 y.o.maleseen here as a referralt from Dr. Lorette Ang A sleep consultation , with additional concerns of memory loss.   06-10-2015 Glenn [name] suffered a stroke about 18 months ago and since then has a hesitant aphasic speech. He has been a smoker all his adult life and has not been able to kick the habit as of yet. The sleep consultation came about because the patient has hypertension had a previous stroke and has COPD he also has always snored, and his wife hears him in the next room.  Dr. Riley Kill ordered a overnight pulse oximetry test. This revealed that the patient stayed at or below 89% oxygenation for 7 hours and 24 minutes the equivalent of 85% of the tested time area interestingly as the night progressed his oxygen saturation actually improved. This is the opposite from what I would expect if the patient has fatigable breathing. His cardiac function however bradycardia and tachycardia increased with the ongoing night. His pulse summary very between 96 bpm and 44 bpm. This test was performed on the night 4-20 2-16 and in [facility] 9 hours and 9 minutes of recording time. The oxygen nadir was 78%. Average duration for a desaturation was 55 seconds, which is prolonged.He has trouble to form full sentences, as he gives his history.  He goes to bed at 9 Pm and gets up at 5 AM. He sleeps alone. His bedroom is cool, quiet and dark, He wake up 3 times on average  for nocturia, and he usually has a cigarette before he returns to be. He drinks a couple of Colas each day and 2 cups of coffee in the AM. He doesn't drink alcohol. He is on chronic narcotic pain medication- oxycodone 10 mg 3 times a day. He may suffer form reduced breathing drive , central apneas. He takes 3 doses, the last at 6 PM.   He has COPD and chronic pain. Is on Ambien and on Lunesta, too . She was diagnosed with a milder degree of renal failure  and was changed from hydrocodone to oxycodone in response to the diagnosis. He is on pro-air, albuterol, amlodipine, Plavix aspirin, oxycodone, Lunesta, Ambien as needed, Nexium, Zocor, Norvasc. He had the first TIA and 2001 he had a stroke in 2012 and then in November 2014 had a major stroke but only at the symptoms of confusion. There was no motor deficit noticed and his speech had initially not changed. No concussion , TBI.   Interval history from 01-13-202017. It has been over a year since we last have seen Glenn [name] here in the sleep clinic. He is a patient with a history of stroke, short-term memory loss, hypertension, COPD, active tobacco use, hyperlipidemia back pain and acid reflux. He was diagnosed on 06/19/2015 with an AHI of 28.7 and titrated to 10 cm water pressure. As the titration was not complete he returned for a full night titration on 07/31/2015, the patient responded not to CPAP and BiPAP with a ST function, pacing his breathing. He was also fitted to a full face mask. He had arrhythmia by EKG, sleep fragmentation, periodic limb movements, and under a as the reached an AHI of 2.9. In the meantime his family is concerned that his memory has progressed to decline but he also has fallen about 14 days ago and hit his head. He had no previous history of traumatic brain injuries or concussions. He had seen his primary care physician, Dr. Gerarda Fraction, but had no CT scan. The fall happened on a staircase in his own home after the lights had failed. It occurred in the dark.  He bled, he did not feel nausea, there was no loss of consciousness according to him, but retrograde amnesia.  This may be the explanation for his fall. But he feels also that he is more unsteady just walking on uneven surface and sometimes takes an extra step to secure his posture- He feels as if he is pushed from the side or pulled, drifts to the side , but not forward or backward  REVIEW OF SYSTEMS: Out of a complete 14  system review of symptoms, the patient complains only of the following symptoms, and all other reviewed systems are negative.  Insomnia, daytime sleepiness, agitation  ALLERGIES: No Known Allergies  HOME MEDICATIONS: Outpatient Medications Prior to Visit  Medication Sig Dispense Refill  . amLODipine (NORVASC) 10 MG tablet Take 10 mg by mouth daily.    Marland Kitchen aspirin 325 MG EC tablet Take 1 tablet (325 mg total) by mouth daily. 30 tablet 0  . atorvastatin (LIPITOR) 20 MG tablet Take 1 tablet (20 mg total) by mouth daily at 6 PM. (Patient taking differently: Take 20 mg by mouth daily. ) 30 tablet 0  . buPROPion (WELLBUTRIN) 100 MG tablet Take 100 mg by mouth 3 (three) times daily.     . cholecalciferol (VITAMIN D) 1000 units tablet Take 2,000 Units by mouth at bedtime.    . clopidogrel (PLAVIX) 75 MG tablet Take 1  tablet (75 mg total) by mouth daily with breakfast. 30 tablet 0  . diphenhydrAMINE (BENADRYL) 25 MG tablet Take 50 mg by mouth at bedtime as needed for sleep.    Marland Kitchen losartan (COZAAR) 100 MG tablet Take 100 mg by mouth daily.     . memantine (NAMENDA) 10 MG tablet Take 1 tablet (10 mg total) by mouth 2 (two) times daily. 60 tablet 5  . omeprazole (PRILOSEC) 40 MG capsule Take 40 mg by mouth daily.     . Oxycodone HCl 20 MG TABS Take 20 mg by mouth 4 (four) times daily as needed (for pain).    . QUEtiapine (SEROQUEL) 100 MG tablet Take 100 mg by mouth at bedtime.     No facility-administered medications prior to visit.     PAST MEDICAL HISTORY: Past Medical History:  Diagnosis Date  . Back pain   . COPD (chronic obstructive pulmonary disease) ([place])   . GERD (gastroesophageal reflux disease)   . HTN (hypertension)   . Hyperlipidemia   . Hypogonadism in male   . Memory changes   . Stroke [facility])    2001 no residual symptom, 2013 no residual symptom  . Tobacco use     PAST SURGICAL HISTORY: Past Surgical History:  Procedure Laterality Date  . BACK SURGERY     4 surgeries  .  CARPAL TUNNEL RELEASE Bilateral    2 surger    FAMILY HISTORY: Family History  Problem Relation Age of Onset  . Other Father   . High blood pressure Maternal Grandmother   . High blood pressure Maternal Grandfather   . Stroke Maternal Aunt   . Stroke Maternal Uncle   . Diabetes Neg Hx   . Cancer Neg Hx     SOCIAL HISTORY: Social History   Social History  . Marital status: Married    Spouse name: N/A  . Number of children: N/A  . Years of education: N/A   Occupational History  . Not on file.   Social History Main Topics  . Smoking status: Current Every Day Smoker    Packs/day: 1.50    Years: 50.00    Types: Cigarettes  . Smokeless tobacco: Never Used  . Alcohol use No  . Drug use: No  . Sexual activity: Yes   Other Topics Concern  . Not on file   Social History Narrative   Lives with his wife and three boys.  Some stairs in the house.  Does not need any assist devices.        Drinks 4 cups of caffeine daily.      PHYSICAL EXAM  Vitals:   03/17/17 1336  BP: (!) 144/56  Pulse: 68  Weight: 142 lb 9.6 oz (64.7 kg)  Height: 5\' 10"  (1.778 m)   Body mass index is 20.46 kg/m.   MMSE - Mini Mental State Exam 03/17/2017 02/11/2017 10/29/2016  Orientation to time 3 1 2   Orientation to Place 3 3 4   Registration 3 3 3   Attention/ Calculation 0 3 2  Recall 1 2 0  Language- name 2 objects 2 2 2   Language- repeat 1 1 1   Language- follow 3 step command 2 3 3   Language- read & follow direction 1 1 1   Write a sentence 0 0 0  Copy design 1 1 0  Total score 17 20 18      Generalized: Well developed, in no acute distress   Neurological examination  Mentation: Alert oriented to time, place, history taking.  Follows all commands speech and language fluent Cranial nerve II-XII: Pupils were equal round reactive to light. Extraocular movements were full, visual field were full on confrontational test. Facial sensation and strength were normal. Uvula tongue midline.  Head turning and shoulder shrug  were normal and symmetric. Motor: The motor testing reveals 5 over 5 strength of all 4 extremities. Good symmetric motor tone is noted throughout.  Sensory: Sensory testing is intact to soft touch on all 4 extremities. No evidence of extinction is noted.  Coordination: Cerebellar testing reveals good finger-nose-finger and heel-to-shin bilaterally.  Gait and station: Gait is normal. Tandem gait is nsteady. Romberg is negative. No drift is seen.  Reflexes: Deep tendon reflexes are symmetric and normal bilaterally.   DIAGNOSTIC DATA (LABS, IMAGING, TESTING) - I reviewed patient records, labs, notes, testing and imaging myself where available.  Lab Results  Component Value Date   WBC 12.5 (H) 03/14/2017   HGB 13.5 03/14/2017   HCT 39.3 03/14/2017   MCV 86.2 03/14/2017   PLT 330 03/14/2017      Component Value Date/Time   NA 137 03/14/2017 1906   K 5.2 (H) 03/14/2017 1906   CL 110 03/14/2017 1906   CO2 21 (L) 03/14/2017 1906   GLUCOSE 118 (H) 03/14/2017 1906   BUN 38 (H) 03/14/2017 1906   CREATININE 2.24 (H) 03/14/2017 1906   CREATININE 1.14 02/17/2012 0925   CALCIUM 8.8 (L) 03/14/2017 1906   PROT 7.0 03/14/2017 1906   ALBUMIN 3.5 03/14/2017 1906   AST 22 03/14/2017 1906   ALT 17 03/14/2017 1906   ALKPHOS 78 03/14/2017 1906   BILITOT 0.3 03/14/2017 1906   GFRNONAA 27 (L) 03/14/2017 1906   GFRAA 31 (L) 03/14/2017 1906   Lab Results  Component Value Date   CHOL 215 (H) 11/03/2013   HDL 36 (L) 11/03/2013   LDLCALC 147 (H) 11/03/2013   TRIG 161 (H) 11/03/2013   CHOLHDL 6.0 11/03/2013   Lab Results  Component Value Date   HGBA1C 6.1 (H) 11/03/2013    Lab Results  Component Value Date   TSH 2.637 03/14/2017      ASSESSMENT AND PLAN 76 y.o. year old male  has a past medical history of Back pain; COPD (chronic obstructive pulmonary disease) ([place]); GERD (gastroesophageal reflux disease); HTN (hypertension); Hyperlipidemia; Hypogonadism in  male; Memory changes; Stroke ([place]); and Tobacco use. here with:  1. Dementia 2. Accidental overdose  The patient's memory score has slightly declined. MMSE the 17/30 was producing 20/30. He will continue on Namenda. His facial twitching has resolved. He will schedule an EEG and check out. Advised that he should consider taking minimal amounts of Percocet. I advised the patient discussed this with his primary care provider. He voiced understanding. He will follow-up in August with Dr. Mechele Claude, MSN, NP-C 03/17/2017, 1:37 PM [facility] [address] [place], [place] [address] [phone]

## 2017-03-17 NOTE — Progress Notes (Signed)
I agree with the assessment and plan as directed by NP .The patient is known to me .   Tyrek Lawhorn, MD  

## 2017-03-17 NOTE — Patient Instructions (Signed)
Continue Namenda Memory score slightly declined. We will monitor Schedule EEG at checkout today If your symptoms worsen or you develop new symptoms please let us know.

## 2017-03-22 ENCOUNTER — Ambulatory Visit (INDEPENDENT_AMBULATORY_CARE_PROVIDER_SITE_OTHER): Payer: Medicare Other | Admitting: Neurology

## 2017-03-22 DIAGNOSIS — T50901A Poisoning by unspecified drugs, medicaments and biological substances, accidental (unintentional), initial encounter: Secondary | ICD-10-CM

## 2017-03-22 DIAGNOSIS — R410 Disorientation, unspecified: Secondary | ICD-10-CM

## 2017-04-07 ENCOUNTER — Telehealth: Payer: Self-pay | Admitting: Neurology

## 2017-04-07 NOTE — Telephone Encounter (Signed)
EEG normal. EKG channel was irregular. -VRP

## 2017-04-07 NOTE — Telephone Encounter (Signed)
[name],   I just spoke with Dr. Felecia Jan mildly reported that this EEG was entirely normal, with a background rhythm between 8 and 10 Hz, no focal slowing and no epileptiform discharges. Please relate results to Mrs. Glenn [name]. CD

## 2017-04-07 NOTE — Telephone Encounter (Signed)
Vikram, please check this EEG- did read this for me ? Asencion Partridge

## 2017-04-07 NOTE — Progress Notes (Signed)
This was an EEG encounter. Not an office visit. -VRP

## 2017-04-07 NOTE — Telephone Encounter (Signed)
Pt's wife called request EEG results

## 2017-04-07 NOTE — Procedures (Signed)
   [facility]  EEG (ELECTROENCEPHALOGRAM) REPORT   STUDY DATE: 03/22/17 PATIENT NAME: Glenn [name] DOB: [birth year] MRN: [id number]  ORDERING CLINICIAN: Larey Seat, MD   TECHNOLOGIST: Laretta Alstrom  TECHNIQUE: Electroencephalogram was recorded utilizing standard 10-20 system of lead placement and reformatted into average and bipolar montages.  RECORDING TIME: 25 minutes ACTIVATION: photic stimulation  CLINICAL INFORMATION: 76 year old male with dementia and facial twitching.  FINDINGS: Background rhythms of 8-9 hertz and 30-40 microvolts. No focal, lateralizing, epileptiform activity or seizures are seen. Patient recorded in the awake and drowsy state. EKG channel shows IRREGULAR 60-70 beats per minute.   IMPRESSION:   Normal EEG in the awake and drowsy states. No epileptiform discharges.  However, EKG channel shows IRREGULAR 60-70 beats per minute. Correlate clinically.      INTERPRETING PHYSICIAN:  Penni Bombard, MD Certified in Neurology, Neurophysiology and Neuroimaging  [facility] [address], [place], [place] [address] [phone]

## 2017-04-08 NOTE — Telephone Encounter (Signed)
LM (per DPR) with results below. Left call back number for any questions.

## 2017-04-22 ENCOUNTER — Other Ambulatory Visit (HOSPITAL_COMMUNITY): Payer: Self-pay | Admitting: Internal Medicine

## 2017-04-22 DIAGNOSIS — G894 Chronic pain syndrome: Secondary | ICD-10-CM | POA: Diagnosis not present

## 2017-04-22 DIAGNOSIS — Z1389 Encounter for screening for other disorder: Secondary | ICD-10-CM | POA: Diagnosis not present

## 2017-04-22 DIAGNOSIS — K5900 Constipation, unspecified: Secondary | ICD-10-CM | POA: Diagnosis not present

## 2017-04-22 DIAGNOSIS — R1313 Dysphagia, pharyngeal phase: Secondary | ICD-10-CM

## 2017-04-22 DIAGNOSIS — J449 Chronic obstructive pulmonary disease, unspecified: Secondary | ICD-10-CM | POA: Diagnosis not present

## 2017-04-22 DIAGNOSIS — Z6822 Body mass index (BMI) 22.0-22.9, adult: Secondary | ICD-10-CM | POA: Diagnosis not present

## 2017-04-29 ENCOUNTER — Ambulatory Visit (HOSPITAL_COMMUNITY)
Admission: RE | Admit: 2017-04-29 | Discharge: 2017-04-29 | Disposition: A | Discharge status: Home / Self Care | Payer: Medicare Other | Source: Ambulatory Visit | Attending: Internal Medicine | Admitting: Internal Medicine

## 2017-04-29 DIAGNOSIS — R1313 Dysphagia, pharyngeal phase: Secondary | ICD-10-CM

## 2017-04-29 DIAGNOSIS — K224 Dyskinesia of esophagus: Secondary | ICD-10-CM | POA: Diagnosis not present

## 2017-04-29 DIAGNOSIS — R1310 Dysphagia, unspecified: Secondary | ICD-10-CM | POA: Diagnosis not present

## 2017-05-05 ENCOUNTER — Other Ambulatory Visit (HOSPITAL_COMMUNITY): Payer: Self-pay | Admitting: Specialist

## 2017-05-05 DIAGNOSIS — R1319 Other dysphagia: Secondary | ICD-10-CM

## 2017-05-13 ENCOUNTER — Ambulatory Visit (INDEPENDENT_AMBULATORY_CARE_PROVIDER_SITE_OTHER): Payer: Medicare Other | Admitting: Otolaryngology

## 2017-05-13 DIAGNOSIS — K219 Gastro-esophageal reflux disease without esophagitis: Secondary | ICD-10-CM

## 2017-05-13 DIAGNOSIS — R1312 Dysphagia, oropharyngeal phase: Secondary | ICD-10-CM

## 2017-05-17 ENCOUNTER — Ambulatory Visit (HOSPITAL_COMMUNITY)
Admission: RE | Admit: 2017-05-17 | Discharge: 2017-05-17 | Disposition: A | Discharge status: Home / Self Care | Payer: Medicare Other | Source: Ambulatory Visit | Attending: Internal Medicine | Admitting: Internal Medicine

## 2017-05-17 ENCOUNTER — Ambulatory Visit (HOSPITAL_COMMUNITY): Payer: Medicare Other | Attending: Internal Medicine | Admitting: Speech Pathology

## 2017-05-17 ENCOUNTER — Encounter (HOSPITAL_COMMUNITY): Payer: Self-pay | Admitting: Speech Pathology

## 2017-05-17 DIAGNOSIS — R1312 Dysphagia, oropharyngeal phase: Secondary | ICD-10-CM | POA: Diagnosis not present

## 2017-05-17 DIAGNOSIS — R1319 Other dysphagia: Secondary | ICD-10-CM | POA: Insufficient documentation

## 2017-05-17 DIAGNOSIS — R1310 Dysphagia, unspecified: Secondary | ICD-10-CM | POA: Diagnosis not present

## 2017-05-17 DIAGNOSIS — I69391 Dysphagia following cerebral infarction: Secondary | ICD-10-CM | POA: Diagnosis not present

## 2017-05-17 NOTE — Therapy (Signed)
[place], [place], [address] Phone: [phone]   Fax:  [phone]  Modified Barium Swallow  Patient Details  Name: Glenn [name] MRN: [id number] Date of Birth: 1941/07/24 No Data Recorded  Encounter Date: 05/17/2017      End of Session - 05/17/17 1652    Visit Number 1   Number of Visits 1   Authorization Type Medicare   SLP Start Time 8502   SLP Stop Time  1335   SLP Time Calculation (min) 30 min   Activity Tolerance Patient tolerated treatment well      Past Medical History:  Diagnosis Date  . Back pain   . COPD (chronic obstructive pulmonary disease) ([place])   . GERD (gastroesophageal reflux disease)   . HTN (hypertension)   . Hyperlipidemia   . Hypogonadism in male   . Memory changes   . Stroke [facility])    2001 no residual symptom, 2013 no residual symptom  . Tobacco use     Past Surgical History:  Procedure Laterality Date  . BACK SURGERY     4 surgeries  . CARPAL TUNNEL RELEASE Bilateral    2 surger    There were no vitals filed for this visit.      Subjective Assessment - 05/17/17 1630    Subjective "I got choked on some bread."   Special Tests MBSS   Currently in Pain? No/denies           General - 05/17/17 1631      General Information   Date of Onset 12/28/16   HPI Mr. Glenn [name] is a 76 yo male who was referred for MBSS by Dr. Redmond School due to complaints of difficulty swallowing breads since December. He had a barium swallow on 04/29/2017 which showed age related esophageal dysmotility, moderate vallecular and pyriform sinus residuals, and nasopharyngeal aspiration of the ingested effervescent granules. Pt's spouse reports that Pt choked on a piece of bread in December and their son had to perform the Heimlich maneuver. Pt states that he has since become more careful when eating to take small bites/sips and chew thoroughly. Pt is known to this SLP from previous  cognitive linguistic therapy following a stroke in 2014, however the Pt does not recall this due to residual cognitive deficits.   Type of Study MBS-Modified Barium Swallow Study   Diet Prior to this Study Regular;Thin liquids   Temperature Spikes Noted No   Respiratory Status Room air   History of Recent Intubation No   Behavior/Cognition Alert;Cooperative;Pleasant mood   Oral Cavity Assessment Within Functional Limits   Oral Care Completed by SLP No   Oral Cavity - Dentition Adequate natural dentition   Vision Functional for self feeding   Self-Feeding Abilities Able to feed self   Patient Positioning Upright in chair   Baseline Vocal Quality Normal   Volitional Cough Strong   Volitional Swallow Able to elicit   Anatomy Within functional limits   Pharyngeal Secretions Not observed secondary MBS          Adult Oral Care Protocol - 05/17/17 1632      Oral Assessment (Complete on admission/transfer/change in patient condition)   Does patient have any of the following "high risk" factors? None of the above   Does patient have any of the following "at risk" factors? None of the above   Patient is LOW RISK Follow universal precautions (see row information)  Oral Preparation/Oral Phase - 2017-05-18 1632      Oral Preparation/Oral Phase   Oral Phase Impaired     Oral - Solids   Oral - Pill Delayed A-P transit     Electrical stimulation - Oral Phase   Was Electrical Stimulation Used No          Pharyngeal Phase - 05-18-2017 1643      Pharyngeal Phase   Pharyngeal Phase Impaired     Pharyngeal - Thin   Pharyngeal- Thin Cup Delayed swallow initiation;Swallow initiation at pyriform sinus;Reduced tongue base retraction;Pharyngeal residue - valleculae;Pharyngeal residue - pyriform  min residuals post swallow   Pharyngeal- Thin Straw Swallow initiation at pyriform sinus;Pharyngeal residue - valleculae;Pharyngeal residue - pyriform     Pharyngeal - Solids    Pharyngeal- Puree Swallow initiation at vallecula;Reduced tongue base retraction;Reduced epiglottic inversion;Pharyngeal residue - valleculae;Pharyngeal residue - pyriform;Compensatory strategies attempted (with notebox)  repeat swallow clears residuals   Pharyngeal- Mechanical Soft Swallow initiation at vallecula;Reduced tongue base retraction;Pharyngeal residue - valleculae   Pharyngeal- Regular Delayed swallow initiation-vallecula;Reduced tongue base retraction;Reduced epiglottic inversion;Pharyngeal residue - valleculae  mod vallecular residue   Pharyngeal- Pill Within functional limits     Pharyngeal Phase - Comment   Pharyngeal Comment delay in swallow initiation to the pyriforms with thin liquids (no penetration/aspiration)     Electrical Stimulation - Pharyngeal Phase   Was Electrical Stimulation Used No          Cricopharyngeal Phase - May 18, 2017 1647      Cervical Esophageal Phase   Cervical Esophageal Phase Within functional limits          Plan - 2017-05-18 1652    Clinical Impression Statement Pt presents with mild oral phase characterized by difficulty moving barium tablet posteriorly and mild pharyngeal phase characterized by delay in swallow initiation more significantly with liquids as the entire thin liquid bolus fills the pyriforms prior to swallow trigger and min decrease tongue base retraction resulting in mild vallecular and pyriform residuals post swallow (no penetration or aspiration observed). Pt demonstrates decreased tongue base retraction and mild decrease in epiglottic deflection with puree and solids resulting in variable mild to moderate vallecular residue post swallow. SLP provided verbal cue to repeat/dry swallow which facilitated clearance of pharyngeal residuals. The barium tablet was swallowed with a cup sip of thin liquids and was transiently delayed near GE junction. Pt given sequential straw sips of thin in attempt to clear barium tablet from esophagus  which resulted in increased pyriform residue post swallow and barium tablet still not cleared. Air-filled esophagus noted. Pt given puree and additional thin liquids and barium tablet eventually cleared. Of note, Pt reported globus sensation at the level of the pharynx when barium tablet was near GE junction. Recommend regular textures (alter meats and bread appropriately via small, bite sized pieces) and thin liquids. Pt to take small bites and follow solids with liquids; swallow 2x for each bite/sip to help facilitate pharyngeal clearance of residue. Standard aspiration and reflux precautions. Above reviewed with Pt and spouse. No further SLP services at this time.    Consulted and Agree with Plan of Care Patient;Family member/caregiver   Family Member Consulted Spouse      Patient will benefit from skilled therapeutic intervention in order to improve the following deficits and impairments:   Dysphagia, oropharyngeal phase      G-Codes - 05-18-2017 1701    Functional Assessment Tool Used clinical judgment; MBSS   Functional Limitations Swallowing   Swallow  Current Status [phone]) At least 1 percent but less than 20 percent impaired, limited or restricted   Swallow Goal Status (Q2060) At least 1 percent but less than 20 percent impaired, limited or restricted   Swallow Discharge Status [id number]) At least 1 percent but less than 20 percent impaired, limited or restricted          Recommendations/Treatment - 05/17/17 1647      Swallow Evaluation Recommendations   SLP Diet Recommendations Age appropriate regular;Thin   Liquid Administration via Cup;Straw   Medication Administration Whole meds with liquid   Supervision Patient able to self feed   Compensations Slow rate;Small sips/bites;Follow solids with liquid   Postural Changes Seated upright at 90 degrees;Remain upright for at least 30 minutes after feeds/meals          Prognosis - 05/17/17 1651      Prognosis   Prognosis for Safe  Diet Advancement Good   Barriers to Reach Goals Cognitive deficits     Individuals Consulted   Consulted and Agree with Results and Recommendations Patient;Family member/caregiver   Family Member Consulted Wife   Report Sent to  Referring physician      Problem List Patient Active Problem List   Diagnosis Date Noted  . Excessive daytime sleepiness 08/09/2015  . CVA (cerebral vascular accident) ([place]) 08/09/2015  . COPD exacerbation ([place]) 06/10/2015  . Hemiparesis affecting right side as late effect of cerebrovascular accident ([place]) 06/10/2015  . Opioid dependence with opioid-induced sleep disorder ([place]) 06/10/2015  . Impairment of visual perception 12/19/2013  . Lack of coordination 12/19/2013  . Muscle weakness (generalized) 12/19/2013  . Aphasia following cerebrovascular disease 12/11/2013  . Acute ischemic stroke ([place]) 11/03/2013  . Acute renal failure ([place]) 11/03/2013  . COPD (chronic obstructive pulmonary disease) ([place]) 08/06/2012  . Tobacco abuse 08/06/2012  . Memory difficulties 08/06/2012  . Hyperlipidemia 08/06/2012  . CVA (cerebral infarction) 08/05/2012  . HTN (hypertension) 08/05/2012  . CONSTIPATION 05/14/2009  . CHANGE IN BOWELS 05/14/2009  . COLONIC POLYPS, HX OF 05/14/2009  . SHOULDER, ARTHRITIS, DEGEN./OSTEO 03/14/2008  . SHOULDER PAIN 03/14/2008  . IMPINGEMENT SYNDROME 03/14/2008  . RUPTURE ROTATOR CUFF 03/14/2008   Thank you,  Genene Churn, [place]  Genene Churn 05/17/2017, [place] [address] [place], [place], [address] Phone: [phone]   Fax:  [phone]  Name: Glenn [name] MRN: [id number] Date of Birth: 1941/11/16

## 2017-06-07 ENCOUNTER — Other Ambulatory Visit: Payer: Self-pay | Admitting: Neurology

## 2017-06-07 DIAGNOSIS — R413 Other amnesia: Secondary | ICD-10-CM

## 2017-07-08 ENCOUNTER — Other Ambulatory Visit: Payer: Self-pay | Admitting: Neurology

## 2017-07-08 DIAGNOSIS — R413 Other amnesia: Secondary | ICD-10-CM

## 2017-07-15 ENCOUNTER — Ambulatory Visit (INDEPENDENT_AMBULATORY_CARE_PROVIDER_SITE_OTHER): Payer: Self-pay | Admitting: Otolaryngology

## 2017-08-09 ENCOUNTER — Other Ambulatory Visit: Payer: Self-pay | Admitting: Neurology

## 2017-08-09 DIAGNOSIS — R413 Other amnesia: Secondary | ICD-10-CM

## 2017-08-12 ENCOUNTER — Ambulatory Visit (INDEPENDENT_AMBULATORY_CARE_PROVIDER_SITE_OTHER): Payer: Medicare Other | Admitting: Neurology

## 2017-08-12 ENCOUNTER — Encounter: Payer: Self-pay | Admitting: Neurology

## 2017-08-12 VITALS — BP 115/64 | HR 62 | Ht 70.0 in | Wt 144.0 lb

## 2017-08-12 DIAGNOSIS — F01518 Vascular dementia, unspecified severity, with other behavioral disturbance: Secondary | ICD-10-CM | POA: Insufficient documentation

## 2017-08-12 DIAGNOSIS — Z91199 Patient's noncompliance with other medical treatment and regimen due to unspecified reason: Secondary | ICD-10-CM

## 2017-08-12 DIAGNOSIS — F0151 Vascular dementia with behavioral disturbance: Secondary | ICD-10-CM

## 2017-08-12 DIAGNOSIS — G4733 Obstructive sleep apnea (adult) (pediatric): Secondary | ICD-10-CM | POA: Diagnosis not present

## 2017-08-12 DIAGNOSIS — Z9119 Patient's noncompliance with other medical treatment and regimen: Secondary | ICD-10-CM | POA: Diagnosis not present

## 2017-08-12 NOTE — Patient Instructions (Signed)
Cerebral Arteriosclerosis Cerebral arteriosclerosis is a condition that affects the arteries in your brain. When you have cerebral arteriosclerosis, these arteries become thicker, harder, and narrower than normal. Once this damage starts, a sticky substance (plaque) can build up inside your arteries and block blood flow. This reduces the normal blood flow to your brain. Blood carries oxygen to your brain. Since blood flow to your brain is reduced, your brain may not get the oxygen it needs. This process may start early in life and build up over time. Blood clots or plaques that rupture and break off may completely block blood flow and cause sudden and serious damage. Cerebral arteriosclerosis can cause serious problems such as stroke or dementia. What increases the risk? Risk factors for developing cerebral arteriosclerosis include:  Being male.  Older age. The likelihood of this disorder increases with age.  Being Asian, African American, or Hispanic.  Having a family history of certain conditions, such as heart disease or stroke.  Having heart disease or a history of stroke.  Lifestyle factors that can increase your risk of developing cerebral arteriosclerosis include:  Smoking or being exposed to secondhand smoke.  Having diabetes.  Being overweight.  Not getting enough exercise.  Having high blood pressure (hypertension).  Having high cholesterol.  Having a diet high in fat.  What are the signs or symptoms? The most common symptoms of cerebral arteriosclerosis include:  Numbness or weakness in your arms or legs.  Speech problems.  Problems swallowing.  Vision problems.  Confusion.  Irritability.  Personality changes, such as loss of interest, irritability, or confusion (vascular dementia).  Headache or facial pain.  How is this diagnosed? Your health care provider may diagnose cerebral arteriosclerosis based on your symptoms and a physical exam. You may also  have imaging studiesof your brain to confirm the diagnosis. These may include:  An imaging test using sound waves (ultrasound).  An imaging test after getting an injection of dye (angiogram).  How is this treated? Treatment may include:  Medicine to control conditions that put you at risk of developing cerebral arteriosclerosis. These conditions may include: ? High blood pressure. ? High cholesterol. ? Diabetes.  Taking medicine to thin your blood and prevent stroke.  Surgery. This may be: ? Intracranial stent placement. This is a procedure to locate a blocked artery in the brain, widen the artery, and keep the artery open. ? Surgery to bypass a blocked artery with a healthy artery taken from another part of the body.  Follow these instructions at home:  Take medicine only as directed by your health care provider.  Do not use any tobacco products, including cigarettes, chewing tobacco, or electronic cigarettes. If you need help quitting, ask your health care provider.  Eat a low-fat, low-cholesterol, and low-sodium diet as directed by your health care provider.  Follow an exercise program approved by your health care provider.  Maintain a healthy weight. Lose weight if necessary.  Work with your health care provider to manage other health conditions, such as hypertension or diabetes.  Keep all follow-up visits as directed by your health care provider. This is important. Contact a health care provider if:  You have frequent headaches.  You have dizziness.  You or someone else notices changes in your personality.  You have facial pain.  You have periods of confusion. Get help right away if:  You have a very bad headache.  You have weakness or numbness in your arms or legs.  You have droopiness of  your face.  You have difficulty speaking.  You have difficulty swallowing.  You have trouble understanding what other people are saying.  You have sudden  confusion.  You have sudden vision problems.  You have a seizure or loss of consciousness. Any of these symptoms may represent a serious problem that is an emergency. Do not wait to see if the symptoms will go away. Get medical help right away. Call your local emergency services (911 in [place] Do not drive yourself to the hospital. This information is not intended to replace advice given to you by your health care provider. Make sure you discuss any questions you have with your health care provider. Document Released: 12/04/2002 Document Revised: 05/21/2016 Document Reviewed: 02/01/2014 Elsevier Interactive Patient Education  2018 [employer].

## 2017-08-12 NOTE — Progress Notes (Addendum)
PATIENT: Glenn [name] DOB: 1941-11-30  REASON FOR VISIT: follow up- dementia HISTORY FROM: patient  HISTORY OF PRESENT ILLNESS:   Glenn [name] is seen on 08-12-2017 , his last appointment with [place] was in early May 2018, the patient had accidentally overdosed on blood pressure medicine and developed facial twitching, tremor. An EEG was ordered and interpreted by Dr. Leta Baptist as normal. He is seen here with a male family member.  The twitching had resolved before the EEG was performed. Glenn [name] suffered a stroke in late 2014 and had transiently significant aphagia but remained with some mild aphagia later. He had hypertension, a previous stroke, was a long-time tobacco user with COPD, had always snored and was tested positive for sleep related hypoxemia. He was diagnosed with obstructive sleep apnea but did not agree to use CPAP. Today we repeated a Mini-Mental Status Examination and it showed 16 out of 30 points. With a progressive memory loss, although it's slowly. His daughter reports he is smoking since age 48, and now he would go to bed and smokes at night , when he wakes up frequently from sleep. He would not accept nicotrol patches or gum. He remains ataxic and now he is sometimes combative, agitated and very aggressive to his daughter ( they love together) .he spends 90% of his waking hours watching TV and napping, he does not leave the house. No daylight exposure. 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 . He needs reminders to take a bath. He eats regularly.    MM- April  2018 - Glenn [name] is a 76 year old male with a history of dementia, insomnia, ataxia and postconcussion syndrome. He returns today for follow-up. He remains on Namenda 10 mg twice a day. The primary reason for his return today is he went to the emergency room on March 18 due to twitching of the face. At that time his wife reported that he actually took too much of his blood pressure medication and  Percocet. The patient's wife spoke to Dr. Brett Fairy and an EEG was ordered. They will get that scheduled today. Wife reports that the facial twitching has resolved. She states that he has returned to his baseline. She still notices some daytime sleepiness but overall the patient has returned to normal. She is now managing his medications and ensuring that he takes them correctly. They have a follow-up with his primary care provider March 27.  Update: February 11 2017 Glenn [name] is a 76 year old male with a history of dementia, insomnia, ataxia and postconcussion syndrome. He returns today for follow-up. The patient is currently on Namenda 10 mg twice a day. He lives at home with his wife. He is able to complete most ADLs independently. He does not operate a motor vehicle. His wife prepares all meals. She also handles the finances. He states that most of this occurred once he had stroke. He states that he does have trouble sleeping. He frequently wakes up during the night. He does have obstructive sleep apnea however refuses to use CPAP machine. The patient is no longer using Ambien at bedtime. He is using an over-the-counter medication. Denies hallucinations. Reports he has a good appetite. Denies any significant changes in his mood or behavior. His wife reports that occasionally when he gets frustrated he will use curse words whereas prior to his stroke he would not have done this. He returns today for an evaluation.  HISTORY per Dr. Edwena Felty notes: Glenn [name] a  76 y.o.maleseen here as a referralt from Dr. Lorette Ang A sleep consultation , with additional concerns of memory loss.   06-10-2015 Glenn [name] suffered a stroke about 18 months ago and since then has a hesitant aphasic speech. He has been a smoker all his adult life and has not been able to kick the habit as of yet. The sleep consultation came about because the patient has hypertension had a previous stroke and has COPD he also has  always snored, and his wife hears him in the next room.  Dr. Riley Kill ordered a overnight pulse oximetry test. This revealed that the patient stayed at or below 89% oxygenation for 7 hours and 24 minutes the equivalent of 85% of the tested time area interestingly as the night progressed his oxygen saturation actually improved. This is the opposite from what I would expect if the patient has fatigable breathing. His cardiac function however bradycardia and tachycardia increased with the ongoing night. His pulse summary very between 96 bpm and 44 bpm. This test was performed on the night 4-20 2-16 and in [facility] 9 hours and 9 minutes of recording time. The oxygen nadir was 78%. Average duration for a desaturation was 55 seconds, which is prolonged.He has trouble to form full sentences, as he gives his history.  He goes to bed at 9 Pm and gets up at 5 AM. He sleeps alone. His bedroom is cool, quiet and dark, He wake up 3 times on average for nocturia, and he usually has a cigarette before he returns to be. He drinks a couple of Colas each day and 2 cups of coffee in the AM. He doesn't drink alcohol. He is on chronic narcotic pain medication- oxycodone 10 mg 3 times a day. He may suffer form reduced breathing drive , central apneas. He takes 3 doses, the last at 6 PM.   He has COPD and chronic pain. Is on Ambien and on Lunesta, too . She was diagnosed with a milder degree of renal failure and was changed from hydrocodone to oxycodone in response to the diagnosis. He is on pro-air, albuterol, amlodipine, Plavix aspirin, oxycodone, Lunesta, Ambien as needed, Nexium, Zocor, Norvasc. He had the first TIA and 2001 he had a stroke in 2012 and then in November 2014 had a major stroke but only at the symptoms of confusion. There was no motor deficit noticed and his speech had initially not changed. No concussion , TBI.   Interval history from 10-27-202017. It has been over a year since we last have seen Glenn [name] here in  the sleep clinic. He is a patient with a history of stroke, short-term memory loss, hypertension, COPD, active tobacco use, hyperlipidemia back pain and acid reflux. He was diagnosed on 06/19/2015 with an AHI of 28.7 and titrated to 10 cm water pressure. As the titration was not complete he returned for a full night titration on 07/31/2015, the patient responded not to CPAP and BiPAP with a ST function, pacing his breathing. He was also fitted to a full face mask. He had arrhythmia by EKG, sleep fragmentation, periodic limb movements, and under a as the reached an AHI of 2.9. In the meantime his family is concerned that his memory has progressed to decline but he also has fallen about 14 days ago and hit his head. He had no previous history of traumatic brain injuries or concussions. He had seen his primary care physician, Dr. Gerarda Fraction, but had no CT scan. The fall happened on a staircase  in his own home after the lights had failed. It occurred in the dark.  He bled, he did not feel nausea, there was no loss of consciousness according to him, but retrograde amnesia. This may be the explanation for his fall. But he feels also that he is more unsteady just walking on uneven surface and sometimes takes an extra step to secure his posture- He feels as if he is pushed from the side or pulled, drifts to the side , but not forward or backward  REVIEW OF SYSTEMS: Out of a complete 14 system review of symptoms, the patient complains only of the following symptoms, and all other reviewed systems are negative.  Insomnia, daytime sleepiness, agitation 0 dementia, back problems, tobacco user.   ALLERGIES: No Known Allergies  HOME MEDICATIONS: Outpatient Medications Prior to Visit  Medication Sig Dispense Refill  . amLODipine (NORVASC) 10 MG tablet Take 10 mg by mouth daily.    Marland Kitchen aspirin 325 MG EC tablet Take 1 tablet (325 mg total) by mouth daily. 30 tablet 0  . atorvastatin (LIPITOR) 20 MG tablet Take 1 tablet  (20 mg total) by mouth daily at 6 PM. (Patient taking differently: Take 20 mg by mouth daily. ) 30 tablet 0  . buPROPion (WELLBUTRIN) 100 MG tablet Take 100 mg by mouth 2 (two) times daily.     . cholecalciferol (VITAMIN D) 1000 units tablet Take 2,000 Units by mouth at bedtime.    . clopidogrel (PLAVIX) 75 MG tablet Take 1 tablet (75 mg total) by mouth daily with breakfast. 30 tablet 0  . diphenhydrAMINE (BENADRYL) 25 MG tablet Take 50 mg by mouth at bedtime as needed for sleep.    Marland Kitchen losartan (COZAAR) 100 MG tablet Take 50 mg by mouth daily.     . Melatonin 3 MG TABS Take 1 tablet by mouth at bedtime.    . memantine (NAMENDA) 10 MG tablet TAKE (1) TABLET BY MOUTH TWICE DAILY. 60 tablet 2  . Multiple Vitamin (MULTIVITAMIN) tablet Take 1 tablet by mouth daily.    Marland Kitchen omeprazole (PRILOSEC) 40 MG capsule Take 40 mg by mouth daily.     Marland Kitchen OVER THE COUNTER MEDICATION VITAMIN E 180mg  po daily    . Oxycodone HCl 20 MG TABS Take 20 mg by mouth 2 (two) times daily as needed (for pain).     . QUEtiapine (SEROQUEL) 100 MG tablet Take 100 mg by mouth at bedtime.     No facility-administered medications prior to visit.     PAST MEDICAL HISTORY: Past Medical History:  Diagnosis Date  . Back pain   . COPD (chronic obstructive pulmonary disease) ([place])   . GERD (gastroesophageal reflux disease)   . HTN (hypertension)   . Hyperlipidemia   . Hypogonadism in male   . Memory changes   . Stroke [facility])    2001 no residual symptom, 2013 no residual symptom  . Tobacco use     PAST SURGICAL HISTORY: Past Surgical History:  Procedure Laterality Date  . BACK SURGERY     4 surgeries  . CARPAL TUNNEL RELEASE Bilateral    2 surger    FAMILY HISTORY: Family History  Problem Relation Age of Onset  . Other Father   . High blood pressure Maternal Grandmother   . High blood pressure Maternal Grandfather   . Stroke Maternal Aunt   . Stroke Maternal Uncle   . Diabetes Neg Hx   . Cancer Neg Hx     SOCIAL  HISTORY:  Social History   Social History  . Marital status: Married    Spouse name: N/A  . Number of children: N/A  . Years of education: N/A   Occupational History  . Not on file.   Social History Main Topics  . Smoking status: Current Every Day Smoker    Packs/day: 1.50    Years: 50.00    Types: Cigarettes  . Smokeless tobacco: Never Used  . Alcohol use No  . Drug use: No  . Sexual activity: Yes   Other Topics Concern  . Not on file   Social History Narrative   Lives with his wife and three boys.  Some stairs in the house.  Does not need any assist devices.        Drinks 4 cups of caffeine daily.  he fell on the stairs to his basement Nov 2017  Concussion ?Marland Kitchen     PHYSICAL EXAM  Vitals:   08/12/17 1114  BP: 115/64  Pulse: 62  Weight: 144 lb (65.3 kg)  Height: 5\' 10"  (1.778 m)   Body mass index is 20.66 kg/m.   MMSE - Mini Mental State Exam 03/17/2017 02/11/2017 10/29/2016  Orientation to time 3 1 2   Orientation to Place 3 3 4   Registration 3 3 3   Attention/ Calculation 0 3 2  Recall 1 2 0  Language- name 2 objects 2 2 2   Language- repeat 1 1 1   Language- follow 3 step command 2 3 3   Language- read & follow direction 1 1 1   Write a sentence 0 0 0  Copy design 1 1 0  Total score 17 20 18      Generalized: Well developed, in no acute distress, cleanly dressed.   Not driving for 4 years.   Neurological examination  Mentation: agitated, he recall situations different form his daughter, easily upset.  Cranial nerve II-XII: Pupils were equal round reactive to light. Right peripheral homonymous vision loss. . Facial sensation and strength were normal. Uvula tongue midline. Head turning and shoulder shrug  were normal and symmetric. Motor: The motor testing reveals 5/ 5 strength of all 4 extremities.  Good symmetric motor tone is noted throughout.   finger-nose-finger and heel-to-shin intact bilaterally.  Marland Kitchen   DIAGNOSTIC DATA (LABS, IMAGING, TESTING) - I  reviewed patient records, labs, notes, testing and imaging myself where available.  Lab Results  Component Value Date   WBC 12.5 (H) 03/14/2017   HGB 13.5 03/14/2017   HCT 39.3 03/14/2017   MCV 86.2 03/14/2017   PLT 330 03/14/2017      Component Value Date/Time   NA 137 03/14/2017 1906   K 5.2 (H) 03/14/2017 1906   CL 110 03/14/2017 1906   CO2 21 (L) 03/14/2017 1906   GLUCOSE 118 (H) 03/14/2017 1906   BUN 38 (H) 03/14/2017 1906   CREATININE 2.24 (H) 03/14/2017 1906   CREATININE 1.14 02/17/2012 0925   CALCIUM 8.8 (L) 03/14/2017 1906   PROT 7.0 03/14/2017 1906   ALBUMIN 3.5 03/14/2017 1906   AST 22 03/14/2017 1906   ALT 17 03/14/2017 1906   ALKPHOS 78 03/14/2017 1906   BILITOT 0.3 03/14/2017 1906   GFRNONAA 27 (L) 03/14/2017 1906   GFRAA 31 (L) 03/14/2017 1906   Lab Results  Component Value Date   CHOL 215 (H) 11/03/2013   HDL 36 (L) 11/03/2013   LDLCALC 147 (H) 11/03/2013   TRIG 161 (H) 11/03/2013   CHOLHDL 6.0 11/03/2013   Lab Results  Component Value Date  HGBA1C 6.1 (H) 11/03/2013    Lab Results  Component Value Date   TSH 2.637 03/14/2017      ASSESSMENT AND PLAN 76 y.o. year old caucasian male patient, who has a progressive memory loss over the last 6 -12 month.  He has contributing risk factors, vascular risk factors - diabetes, inattentiveness, tobacco use history .  1. Dementia, vascular origin, step wise decline with behavior changes.  2. Accidental overdose BP medication. 3. Ongoing tobacco abuse, non compliant with OSA treatment.   08/12/2017, 11:23 AM [facility] [address] [place], [place] [address] [phone]

## 2017-10-20 DIAGNOSIS — G894 Chronic pain syndrome: Secondary | ICD-10-CM | POA: Diagnosis not present

## 2017-10-20 DIAGNOSIS — Z6823 Body mass index (BMI) 23.0-23.9, adult: Secondary | ICD-10-CM | POA: Diagnosis not present

## 2017-10-20 DIAGNOSIS — M159 Polyosteoarthritis, unspecified: Secondary | ICD-10-CM | POA: Diagnosis not present

## 2017-11-15 ENCOUNTER — Other Ambulatory Visit: Payer: Self-pay | Admitting: Neurology

## 2017-11-15 DIAGNOSIS — R413 Other amnesia: Secondary | ICD-10-CM

## 2018-03-29 ENCOUNTER — Emergency Department (HOSPITAL_COMMUNITY): Payer: Medicare Other

## 2018-03-29 ENCOUNTER — Emergency Department (HOSPITAL_COMMUNITY)
Admission: EM | Admit: 2018-03-29 | Discharge: 2018-03-29 | Disposition: A | Discharge status: Home / Self Care | Payer: Medicare Other | Attending: Emergency Medicine | Admitting: Emergency Medicine

## 2018-03-29 ENCOUNTER — Other Ambulatory Visit: Payer: Self-pay

## 2018-03-29 ENCOUNTER — Encounter (HOSPITAL_COMMUNITY): Payer: Self-pay | Admitting: Emergency Medicine

## 2018-03-29 DIAGNOSIS — S0083XA Contusion of other part of head, initial encounter: Secondary | ICD-10-CM | POA: Diagnosis not present

## 2018-03-29 DIAGNOSIS — R410 Disorientation, unspecified: Secondary | ICD-10-CM | POA: Diagnosis not present

## 2018-03-29 DIAGNOSIS — Z8673 Personal history of transient ischemic attack (TIA), and cerebral infarction without residual deficits: Secondary | ICD-10-CM | POA: Diagnosis not present

## 2018-03-29 DIAGNOSIS — W19XXXA Unspecified fall, initial encounter: Secondary | ICD-10-CM | POA: Insufficient documentation

## 2018-03-29 DIAGNOSIS — Y998 Other external cause status: Secondary | ICD-10-CM | POA: Diagnosis not present

## 2018-03-29 DIAGNOSIS — N189 Chronic kidney disease, unspecified: Secondary | ICD-10-CM | POA: Diagnosis not present

## 2018-03-29 DIAGNOSIS — F0390 Unspecified dementia without behavioral disturbance: Secondary | ICD-10-CM | POA: Insufficient documentation

## 2018-03-29 DIAGNOSIS — Z7982 Long term (current) use of aspirin: Secondary | ICD-10-CM | POA: Diagnosis not present

## 2018-03-29 DIAGNOSIS — Z7902 Long term (current) use of antithrombotics/antiplatelets: Secondary | ICD-10-CM | POA: Insufficient documentation

## 2018-03-29 DIAGNOSIS — F17210 Nicotine dependence, cigarettes, uncomplicated: Secondary | ICD-10-CM | POA: Diagnosis not present

## 2018-03-29 DIAGNOSIS — S199XXA Unspecified injury of neck, initial encounter: Secondary | ICD-10-CM | POA: Diagnosis not present

## 2018-03-29 DIAGNOSIS — Y939 Activity, unspecified: Secondary | ICD-10-CM | POA: Insufficient documentation

## 2018-03-29 DIAGNOSIS — Y92009 Unspecified place in unspecified non-institutional (private) residence as the place of occurrence of the external cause: Secondary | ICD-10-CM | POA: Insufficient documentation

## 2018-03-29 DIAGNOSIS — S0990XA Unspecified injury of head, initial encounter: Secondary | ICD-10-CM | POA: Diagnosis not present

## 2018-03-29 DIAGNOSIS — I129 Hypertensive chronic kidney disease with stage 1 through stage 4 chronic kidney disease, or unspecified chronic kidney disease: Secondary | ICD-10-CM | POA: Insufficient documentation

## 2018-03-29 HISTORY — DX: Unspecified dementia, unspecified severity, without behavioral disturbance, psychotic disturbance, mood disturbance, and anxiety: F03.90

## 2018-03-29 LAB — COMPREHENSIVE METABOLIC PANEL
ALK PHOS: 106 U/L (ref 38–126)
ALT: 24 U/L (ref 17–63)
ANION GAP: 13 (ref 5–15)
AST: 41 U/L (ref 15–41)
Albumin: 3.6 g/dL (ref 3.5–5.0)
BILIRUBIN TOTAL: 0.5 mg/dL (ref 0.3–1.2)
BUN: 40 mg/dL — ABNORMAL HIGH (ref 6–20)
CALCIUM: 9.2 mg/dL (ref 8.9–10.3)
CO2: 20 mmol/L — ABNORMAL LOW (ref 22–32)
CREATININE: 3.09 mg/dL — AB (ref 0.61–1.24)
Chloride: 109 mmol/L (ref 101–111)
GFR calc non Af Amer: 18 mL/min — ABNORMAL LOW (ref >60)
GFR, EST AFRICAN AMERICAN: 21 mL/min — AB (ref >60)
GLUCOSE: 128 mg/dL — AB (ref 65–99)
Potassium: 4.5 mmol/L (ref 3.5–5.1)
Sodium: 142 mmol/L (ref 135–145)
TOTAL PROTEIN: 7.8 g/dL (ref 6.5–8.1)

## 2018-03-29 LAB — CBC WITH DIFFERENTIAL/PLATELET
BASOS ABS: 0.1 10*3/uL (ref 0.0–0.1)
BASOS PCT: 1 %
EOS ABS: 0.7 10*3/uL (ref 0.0–0.7)
Eosinophils Relative: 7 %
HCT: 41 % (ref 39.0–52.0)
Hemoglobin: 13.4 g/dL (ref 13.0–17.0)
Lymphocytes Relative: 17 %
Lymphs Abs: 1.7 10*3/uL (ref 0.7–4.0)
MCH: 28 pg (ref 26.0–34.0)
MCHC: 32.7 g/dL (ref 30.0–36.0)
MCV: 85.6 fL (ref 78.0–100.0)
MONO ABS: 1.1 10*3/uL — AB (ref 0.1–1.0)
Monocytes Relative: 11 %
NEUTROS ABS: 6.6 10*3/uL (ref 1.7–7.7)
NEUTROS PCT: 64 %
Platelets: 272 10*3/uL (ref 150–400)
RBC: 4.79 MIL/uL (ref 4.22–5.81)
RDW: 14.4 % (ref 11.5–15.5)
WBC: 10.2 10*3/uL (ref 4.0–10.5)

## 2018-03-29 NOTE — ED Provider Notes (Signed)
[facility] EMERGENCY DEPARTMENT Provider Note   CSN: [id number] Arrival date & time: 03/29/18  1757   Level 5 caveat dementia.  History is obtained from patient's wife who accompanies him  History   Chief Complaint Chief Complaint  Patient presents with  . Fall    HPI Glenn [name] is a 77 y.o. male.  Patient fell 2 days ago while at home, striking his head.  He had swelling at his occiput first noticed yesterday.  Patient denies pain anywhere.  Fall was unwitnessed.  Patient has no recall of the event.  His wife reports that he is been sleeping more over several months and has been slightly more confused since the fall.  He has been eating and drinking normally.  No vomiting.  Gait is slightly unsteady at baseline.  His gait is unchanged since the fall.  No treatment prior to coming here.  His wife reports that he is supposed to use a cane or walker however he refuses to use them  HPI  Past Medical History:  Diagnosis Date  . Back pain   . COPD (chronic obstructive pulmonary disease) ([place])   . Dementia   . GERD (gastroesophageal reflux disease)   . HTN (hypertension)   . Hyperlipidemia   . Hypogonadism in male   . Memory changes   . Stroke [facility])    2001 no residual symptom, 2013 no residual symptom  . Tobacco use     Patient Active Problem List   Diagnosis Date Noted  . Vascular dementia with behavioral disturbance 08/12/2017  . Excessive daytime sleepiness 08/09/2015  . CVA (cerebral vascular accident) ([place]) 08/09/2015  . COPD exacerbation ([place]) 06/10/2015  . Hemiparesis affecting right side as late effect of cerebrovascular accident ([place]) 06/10/2015  . Opioid dependence with opioid-induced sleep disorder ([place]) 06/10/2015  . Impairment of visual perception 12/19/2013  . Lack of coordination 12/19/2013  . Muscle weakness (generalized) 12/19/2013  . Aphasia following cerebrovascular disease 12/11/2013  . Acute ischemic stroke ([place]) 11/03/2013  . Acute renal failure  ([place]) 11/03/2013  . COPD (chronic obstructive pulmonary disease) ([place]) 08/06/2012  . Tobacco abuse 08/06/2012  . Memory difficulties 08/06/2012  . Hyperlipidemia 08/06/2012  . CVA (cerebral infarction) 08/05/2012  . HTN (hypertension) 08/05/2012  . CONSTIPATION 05/14/2009  . CHANGE IN BOWELS 05/14/2009  . COLONIC POLYPS, HX OF 05/14/2009  . SHOULDER, ARTHRITIS, DEGEN./OSTEO 03/14/2008  . SHOULDER PAIN 03/14/2008  . IMPINGEMENT SYNDROME 03/14/2008  . RUPTURE ROTATOR CUFF 03/14/2008    Past Surgical History:  Procedure Laterality Date  . BACK SURGERY     4 surgeries  . BACK SURGERY    . CARPAL TUNNEL RELEASE Bilateral    2 surger  . stroke          Home Medications    Prior to Admission medications   Medication Sig Start Date End Date Taking? Authorizing Provider  amLODipine (NORVASC) 10 MG tablet Take 10 mg by mouth daily.    [provider]  aspirin 325 MG EC tablet Take 1 tablet (325 mg total) by mouth daily. 11/04/13   Janece Canterbury, MD  atorvastatin (LIPITOR) 20 MG tablet Take 1 tablet (20 mg total) by mouth daily at 6 PM. Patient taking differently: Take 20 mg by mouth daily.  11/04/13   Janece Canterbury, MD  buPROPion (WELLBUTRIN) 100 MG tablet Take 100 mg by mouth 2 (two) times daily.     [provider]  cholecalciferol (VITAMIN D) 1000 units tablet Take 2,000 Units  by mouth at bedtime.    [provider]  clopidogrel (PLAVIX) 75 MG tablet Take 1 tablet (75 mg total) by mouth daily with breakfast. 11/04/13   Janece Canterbury, MD  diphenhydrAMINE (BENADRYL) 25 MG tablet Take 50 mg by mouth at bedtime as needed for sleep.    [provider]  losartan (COZAAR) 100 MG tablet Take 50 mg by mouth daily.     [provider]  Melatonin 3 MG TABS Take 1 tablet by mouth at bedtime.    [provider]  memantine (NAMENDA) 10 MG tablet TAKE (1) TABLET BY MOUTH TWICE DAILY. 11/15/17   Dohmeier, Asencion Partridge, MD  Multiple Vitamin  (MULTIVITAMIN) tablet Take 1 tablet by mouth daily.    [provider]  omeprazole (PRILOSEC) 40 MG capsule Take 40 mg by mouth daily.     [provider]  OVER THE COUNTER MEDICATION VITAMIN E 180mg  po daily    [provider]  Oxycodone HCl 20 MG TABS Take 20 mg by mouth 2 (two) times daily as needed (for pain).     [provider]  QUEtiapine (SEROQUEL) 100 MG tablet Take 100 mg by mouth at bedtime.    [provider]  hydrochlorothiazide (HYDRODIURIL) 25 MG tablet Take 25 mg by mouth daily.    03/08/12  [provider]  lisinopril (PRINIVIL,ZESTRIL) 40 MG tablet Take 40 mg by mouth daily. 1/2 tab   03/08/12  [provider]  simvastatin (ZOCOR) 80 MG tablet Take 80 mg by mouth at bedtime. 1/2 tab daily   03/08/12  [provider]    Family History Family History  Problem Relation Age of Onset  . Other Father   . High blood pressure Maternal Grandmother   . High blood pressure Maternal Grandfather   . Stroke Maternal Aunt   . Stroke Maternal Uncle   . Diabetes Neg Hx   . Cancer Neg Hx     Social History Social History   Tobacco Use  . Smoking status: Current Every Day Smoker    Packs/day: 1.50    Years: 50.00    Pack years: 75.00    Types: Cigarettes  . Smokeless tobacco: Never Used  Substance Use Topics  . Alcohol use: No  . Drug use: No    Types: Oxycodone     Allergies   Patient has no known allergies.   Review of Systems Review of Systems  Unable to perform ROS: Dementia  Musculoskeletal: Positive for gait problem.       Chronically unsteady gait     Physical Exam Updated Vital Signs BP 139/79 (BP Location: Right Arm)   Pulse 72   Temp 98.4 F (36.9 C) (Oral)   Resp 16   Ht 5\' 9"  (1.753 m)   Wt 68.9 kg (152 lb)   SpO2 94%   BMI 22.45 kg/m   Physical Exam  Constitutional: He appears well-developed and well-nourished.  HENT:  Paul sized hematoma at occiput  left of midline and  otherwise normocephalic atraumatic  Eyes: Conjunctivae and EOM are normal.  Neck: Neck supple. No tracheal deviation present. No thyromegaly present.  Cardiovascular: Normal rate, regular rhythm and normal heart sounds.  No murmur heard. Pulmonary/Chest: Effort normal and breath sounds normal. He exhibits no tenderness.  Abdominal: Soft. Bowel sounds are normal. He exhibits no distension. There is no tenderness.  Musculoskeletal: Normal range of motion. He exhibits no edema or tenderness.  Entire spine is nontender.  Pelvis stable nontender.  All 4 extremities or contusion abrasion or tenderness neurovascular intact  Neurological: He is alert. No cranial nerve deficit. Coordination normal.  Motor strength is 5/5 overall walks unassisted.  Gait slightly unsteady.  Pronator drift normal.  Cranial nerves II through XII grossly intact  Skin: Skin is warm and dry. No rash noted.  Psychiatric: He has a normal mood and affect.  Nursing note and vitals reviewed.    ED Treatments / Results  Labs (all labs ordered are listed, but only abnormal results are displayed) Labs Reviewed  COMPREHENSIVE METABOLIC PANEL  CBC WITH DIFFERENTIAL/PLATELET    EKG Interpretation  Date/Time:  Tuesday March 29 2018 19:45:10 EDT Ventricular Rate:  54 PR Interval:    QRS Duration: 111 QT Interval:  452 QTC Calculation: 429 R Axis:   55 Text Interpretation:  Sinus rhythm No significant change since last tracing Confirmed by Orlie Dakin [phone]) on 03/29/2018 7:55:40 PM      Results for orders placed or performed during the hospital encounter of 03/29/18  Comprehensive metabolic panel  Result Value Ref Range   Sodium 142 135 - 145 mmol/L   Potassium 4.5 3.5 - 5.1 mmol/L   Chloride 109 101 - 111 mmol/L   CO2 20 (L) 22 - 32 mmol/L   Glucose, Bld 128 (H) 65 - 99 mg/dL   BUN 40 (H) 6 - 20 mg/dL   Creatinine, Ser 3.09 (H) 0.61 - 1.24 mg/dL   Calcium 9.2 8.9 - 10.3 mg/dL   Total Protein 7.8 6.5 - 8.1 g/dL    Albumin 3.6 3.5 - 5.0 g/dL   AST 41 15 - 41 U/L   ALT 24 17 - 63 U/L   Alkaline Phosphatase 106 38 - 126 U/L   Total Bilirubin 0.5 0.3 - 1.2 mg/dL   GFR calc non Af Amer 18 (L) >60 mL/min   GFR calc Af Amer 21 (L) >60 mL/min   Anion gap 13 5 - 15  CBC with Differential/Platelet  Result Value Ref Range   WBC 10.2 4.0 - 10.5 K/uL   RBC 4.79 4.22 - 5.81 MIL/uL   Hemoglobin 13.4 13.0 - 17.0 g/dL   HCT 41.0 39.0 - 52.0 %   MCV 85.6 78.0 - 100.0 fL   MCH 28.0 26.0 - 34.0 pg   MCHC 32.7 30.0 - 36.0 g/dL   RDW 14.4 11.5 - 15.5 %   Platelets 272 150 - 400 K/uL   Neutrophils Relative % 64 %   Neutro Abs 6.6 1.7 - 7.7 K/uL   Lymphocytes Relative 17 %   Lymphs Abs 1.7 0.7 - 4.0 K/uL   Monocytes Relative 11 %   Monocytes Absolute 1.1 (H) 0.1 - 1.0 K/uL   Eosinophils Relative 7 %   Eosinophils Absolute 0.7 0.0 - 0.7 K/uL   Basophils Relative 1 %   Basophils Absolute 0.1 0.0 - 0.1 K/uL   Ct Head Wo Contrast  Result Date: 03/29/2018 CLINICAL DATA:  Fall 2 days ago. Contusion along the back of the head. Somnolence. EXAM: CT HEAD WITHOUT CONTRAST CT CERVICAL SPINE WITHOUT CONTRAST TECHNIQUE: Multidetector CT imaging of the head and cervical spine was performed following the standard protocol without intravenous contrast. Multiplanar CT image reconstructions of the cervical spine were also generated. COMPARISON:  Multiple exams, including 11/21/2016 FINDINGS: CT HEAD FINDINGS Brain: Remote prior left parietal lobe encephalomalacia. Prior left frontal lobe infarct. Remote lacunar infarct in the left periventricular white matter, image 16/2. The brainstem, cerebellum, cerebral peduncles, thalami, basal ganglia, basilar  cisterns, ventricular system appear otherwise unremarkable. No intracranial hemorrhage, mass lesion, or acute CVA. Vascular: There is atherosclerotic calcification of the cavernous carotid arteries bilaterally. Skull: Unremarkable Sinuses/Orbits: Unremarkable Other: No supplemental  non-categorized findings. CT CERVICAL SPINE FINDINGS Alignment: Dextroconvex cervical scoliosis. 2.5 mm degenerative anterolisthesis at C7-T1, this is new compared to 08/08/2012. The degenerative facet arthropathy at this level is significantly increased from that time. Skull base and vertebrae: No fracture identified. Multilevel degenerative facet findings. Prominent loss of disc height at C5-6 and C6-7 with endplate sclerosis at I9-5. Soft tissues and spinal canal: Bilateral carotid atherosclerotic calcification. Disc levels: Notable left foraminal impingement at C3-4, C4-5, and C5-6 due to uncinate and facet spurring. Upper chest: Branch vessel atherosclerotic calcification. Other: No supplemental non-categorized findings. IMPRESSION: 1. No acute intracranial findings or acute cervical spine fracture. The 2.5 mm of anterolisthesis at C7-T1 appears to be degenerative and attributable to the prominent degenerative facet arthropathy. 2. Left foraminal impingement at C3-4, C4-5, and C5-6 due to spurring. Dextroconvex cervical scoliosis. Degenerative disc disease especially at C5-6 and C6-7. 3. Remote large infarct in the left parietal lobe. Prior smaller left frontal lobe infarct. Remote lacunar infarct in the left periventricular white matter. 4. Carotid atherosclerosis. Electronically Signed   By: Van Clines M.D.   On: 03/29/2018 20:10   Ct Cervical Spine Wo Contrast  Result Date: 03/29/2018 CLINICAL DATA:  Fall 2 days ago. Contusion along the back of the head. Somnolence. EXAM: CT HEAD WITHOUT CONTRAST CT CERVICAL SPINE WITHOUT CONTRAST TECHNIQUE: Multidetector CT imaging of the head and cervical spine was performed following the standard protocol without intravenous contrast. Multiplanar CT image reconstructions of the cervical spine were also generated. COMPARISON:  Multiple exams, including 11/21/2016 FINDINGS: CT HEAD FINDINGS Brain: Remote prior left parietal lobe encephalomalacia. Prior left  frontal lobe infarct. Remote lacunar infarct in the left periventricular white matter, image 16/2. The brainstem, cerebellum, cerebral peduncles, thalami, basal ganglia, basilar cisterns, ventricular system appear otherwise unremarkable. No intracranial hemorrhage, mass lesion, or acute CVA. Vascular: There is atherosclerotic calcification of the cavernous carotid arteries bilaterally. Skull: Unremarkable Sinuses/Orbits: Unremarkable Other: No supplemental non-categorized findings. CT CERVICAL SPINE FINDINGS Alignment: Dextroconvex cervical scoliosis. 2.5 mm degenerative anterolisthesis at C7-T1, this is new compared to 08/08/2012. The degenerative facet arthropathy at this level is significantly increased from that time. Skull base and vertebrae: No fracture identified. Multilevel degenerative facet findings. Prominent loss of disc height at C5-6 and C6-7 with endplate sclerosis at J8-8. Soft tissues and spinal canal: Bilateral carotid atherosclerotic calcification. Disc levels: Notable left foraminal impingement at C3-4, C4-5, and C5-6 due to uncinate and facet spurring. Upper chest: Branch vessel atherosclerotic calcification. Other: No supplemental non-categorized findings. IMPRESSION: 1. No acute intracranial findings or acute cervical spine fracture. The 2.5 mm of anterolisthesis at C7-T1 appears to be degenerative and attributable to the prominent degenerative facet arthropathy. 2. Left foraminal impingement at C3-4, C4-5, and C5-6 due to spurring. Dextroconvex cervical scoliosis. Degenerative disc disease especially at C5-6 and C6-7. 3. Remote large infarct in the left parietal lobe. Prior smaller left frontal lobe infarct. Remote lacunar infarct in the left periventricular white matter. 4. Carotid atherosclerosis. Electronically Signed   By: Van Clines M.D.   On: 03/29/2018 20:10   EKG None  Radiology No results found.  Procedures Procedures (including critical care time)  Medications  Ordered in ED Medications - No data to display Results for orders placed or performed during the hospital encounter of 03/29/18  Comprehensive metabolic panel  Result Value Ref Range   Sodium 142 135 - 145 mmol/L   Potassium 4.5 3.5 - 5.1 mmol/L   Chloride 109 101 - 111 mmol/L   CO2 20 (L) 22 - 32 mmol/L   Glucose, Bld 128 (H) 65 - 99 mg/dL   BUN 40 (H) 6 - 20 mg/dL   Creatinine, Ser 3.09 (H) 0.61 - 1.24 mg/dL   Calcium 9.2 8.9 - 10.3 mg/dL   Total Protein 7.8 6.5 - 8.1 g/dL   Albumin 3.6 3.5 - 5.0 g/dL   AST 41 15 - 41 U/L   ALT 24 17 - 63 U/L   Alkaline Phosphatase 106 38 - 126 U/L   Total Bilirubin 0.5 0.3 - 1.2 mg/dL   GFR calc non Af Amer 18 (L) >60 mL/min   GFR calc Af Amer 21 (L) >60 mL/min   Anion gap 13 5 - 15  CBC with Differential/Platelet  Result Value Ref Range   WBC 10.2 4.0 - 10.5 K/uL   RBC 4.79 4.22 - 5.81 MIL/uL   Hemoglobin 13.4 13.0 - 17.0 g/dL   HCT 41.0 39.0 - 52.0 %   MCV 85.6 78.0 - 100.0 fL   MCH 28.0 26.0 - 34.0 pg   MCHC 32.7 30.0 - 36.0 g/dL   RDW 14.4 11.5 - 15.5 %   Platelets 272 150 - 400 K/uL   Neutrophils Relative % 64 %   Neutro Abs 6.6 1.7 - 7.7 K/uL   Lymphocytes Relative 17 %   Lymphs Abs 1.7 0.7 - 4.0 K/uL   Monocytes Relative 11 %   Monocytes Absolute 1.1 (H) 0.1 - 1.0 K/uL   Eosinophils Relative 7 %   Eosinophils Absolute 0.7 0.0 - 0.7 K/uL   Basophils Relative 1 %   Basophils Absolute 0.1 0.0 - 0.1 K/uL   Ct Head Wo Contrast  Result Date: 03/29/2018 CLINICAL DATA:  Fall 2 days ago. Contusion along the back of the head. Somnolence. EXAM: CT HEAD WITHOUT CONTRAST CT CERVICAL SPINE WITHOUT CONTRAST TECHNIQUE: Multidetector CT imaging of the head and cervical spine was performed following the standard protocol without intravenous contrast. Multiplanar CT image reconstructions of the cervical spine were also generated. COMPARISON:  Multiple exams, including 11/21/2016 FINDINGS: CT HEAD FINDINGS Brain: Remote prior left parietal lobe  encephalomalacia. Prior left frontal lobe infarct. Remote lacunar infarct in the left periventricular white matter, image 16/2. The brainstem, cerebellum, cerebral peduncles, thalami, basal ganglia, basilar cisterns, ventricular system appear otherwise unremarkable. No intracranial hemorrhage, mass lesion, or acute CVA. Vascular: There is atherosclerotic calcification of the cavernous carotid arteries bilaterally. Skull: Unremarkable Sinuses/Orbits: Unremarkable Other: No supplemental non-categorized findings. CT CERVICAL SPINE FINDINGS Alignment: Dextroconvex cervical scoliosis. 2.5 mm degenerative anterolisthesis at C7-T1, this is new compared to 08/08/2012. The degenerative facet arthropathy at this level is significantly increased from that time. Skull base and vertebrae: No fracture identified. Multilevel degenerative facet findings. Prominent loss of disc height at C5-6 and C6-7 with endplate sclerosis at V0-3. Soft tissues and spinal canal: Bilateral carotid atherosclerotic calcification. Disc levels: Notable left foraminal impingement at C3-4, C4-5, and C5-6 due to uncinate and facet spurring. Upper chest: Branch vessel atherosclerotic calcification. Other: No supplemental non-categorized findings. IMPRESSION: 1. No acute intracranial findings or acute cervical spine fracture. The 2.5 mm of anterolisthesis at C7-T1 appears to be degenerative and attributable to the prominent degenerative facet arthropathy. 2. Left foraminal impingement at C3-4, C4-5, and C5-6 due to spurring. Dextroconvex cervical scoliosis. Degenerative disc disease especially at C5-6  and C6-7. 3. Remote large infarct in the left parietal lobe. Prior smaller left frontal lobe infarct. Remote lacunar infarct in the left periventricular white matter. 4. Carotid atherosclerosis. Electronically Signed   By: Van Clines M.D.   On: 03/29/2018 20:10   Ct Cervical Spine Wo Contrast  Result Date: 03/29/2018 CLINICAL DATA:  Fall 2 days ago.  Contusion along the back of the head. Somnolence. EXAM: CT HEAD WITHOUT CONTRAST CT CERVICAL SPINE WITHOUT CONTRAST TECHNIQUE: Multidetector CT imaging of the head and cervical spine was performed following the standard protocol without intravenous contrast. Multiplanar CT image reconstructions of the cervical spine were also generated. COMPARISON:  Multiple exams, including 11/21/2016 FINDINGS: CT HEAD FINDINGS Brain: Remote prior left parietal lobe encephalomalacia. Prior left frontal lobe infarct. Remote lacunar infarct in the left periventricular white matter, image 16/2. The brainstem, cerebellum, cerebral peduncles, thalami, basal ganglia, basilar cisterns, ventricular system appear otherwise unremarkable. No intracranial hemorrhage, mass lesion, or acute CVA. Vascular: There is atherosclerotic calcification of the cavernous carotid arteries bilaterally. Skull: Unremarkable Sinuses/Orbits: Unremarkable Other: No supplemental non-categorized findings. CT CERVICAL SPINE FINDINGS Alignment: Dextroconvex cervical scoliosis. 2.5 mm degenerative anterolisthesis at C7-T1, this is new compared to 08/08/2012. The degenerative facet arthropathy at this level is significantly increased from that time. Skull base and vertebrae: No fracture identified. Multilevel degenerative facet findings. Prominent loss of disc height at C5-6 and C6-7 with endplate sclerosis at C1-6. Soft tissues and spinal canal: Bilateral carotid atherosclerotic calcification. Disc levels: Notable left foraminal impingement at C3-4, C4-5, and C5-6 due to uncinate and facet spurring. Upper chest: Branch vessel atherosclerotic calcification. Other: No supplemental non-categorized findings. IMPRESSION: 1. No acute intracranial findings or acute cervical spine fracture. The 2.5 mm of anterolisthesis at C7-T1 appears to be degenerative and attributable to the prominent degenerative facet arthropathy. 2. Left foraminal impingement at C3-4, C4-5, and C5-6  due to spurring. Dextroconvex cervical scoliosis. Degenerative disc disease especially at C5-6 and C6-7. 3. Remote large infarct in the left parietal lobe. Prior smaller left frontal lobe infarct. Remote lacunar infarct in the left periventricular white matter. 4. Carotid atherosclerosis. Electronically Signed   By: Van Clines M.D.   On: 03/29/2018 20:10    Initial Impression / Assessment and Plan / ED Course  I have reviewed the triage vital signs and the nursing notes.  Pertinent labs & imaging results that were available during my care of the patient were reviewed by me and considered in my medical decision making (see chart for details).     Lab work consistent with chronic renal insufficiency, slightly worse than 1 year ago.  Otherwise unremarkable. I have advised patient's wife that patient should have serum creatinine rechecked in 1 or 2 months by primary care physician.  Patient is acting at his baseline.  No evidence of intracranial injury.  Of note patient's wife states that the home is safe as possible.  Patient lives on the ground floor.  They have arrange furniture to prevent falls as best as possible. Final Clinical Impressions(s) / ED Diagnoses   #1 fall #2 Minor head injury #3 chronic renal insufficiency Final diagnoses:  None    ED Discharge Orders    None       Orlie Dakin, MD 03/29/18 2213

## 2018-03-29 NOTE — ED Triage Notes (Signed)
Fall on Sunday, hx of stroke, unsteady gait is not new. Pt hit head, denies LOC, contusion to back of head, base of neck. Per family pt has been sleeping more and more confused.

## 2018-03-29 NOTE — ED Notes (Signed)
Pt to head CT. Will do EKG once he returns

## 2018-03-29 NOTE — ED Notes (Signed)
Gave Dr. Lenna Sciara EKG

## 2018-03-29 NOTE — Discharge Instructions (Addendum)
Glenn [name] kidney function is slightly worse than 1 year ago.  His blood creatinine today is 3.09.   Call Dr .Nolon Rod  office to arrange to get his kidney function rechecked within the next 1 or 2 months .Try to encourage him to use his cane to prevent falls

## 2018-04-13 DIAGNOSIS — I129 Hypertensive chronic kidney disease with stage 1 through stage 4 chronic kidney disease, or unspecified chronic kidney disease: Secondary | ICD-10-CM | POA: Diagnosis not present

## 2018-04-13 DIAGNOSIS — F17210 Nicotine dependence, cigarettes, uncomplicated: Secondary | ICD-10-CM | POA: Diagnosis not present

## 2018-04-13 DIAGNOSIS — G894 Chronic pain syndrome: Secondary | ICD-10-CM | POA: Diagnosis not present

## 2018-04-13 DIAGNOSIS — F0390 Unspecified dementia without behavioral disturbance: Secondary | ICD-10-CM | POA: Diagnosis not present

## 2018-04-13 DIAGNOSIS — N183 Chronic kidney disease, stage 3 (moderate): Secondary | ICD-10-CM | POA: Diagnosis not present

## 2018-04-13 DIAGNOSIS — J449 Chronic obstructive pulmonary disease, unspecified: Secondary | ICD-10-CM | POA: Diagnosis not present

## 2018-04-14 ENCOUNTER — Other Ambulatory Visit: Payer: Self-pay | Admitting: Neurology

## 2018-04-14 DIAGNOSIS — R413 Other amnesia: Secondary | ICD-10-CM

## 2018-04-17 ENCOUNTER — Emergency Department (HOSPITAL_COMMUNITY)
Admission: EM | Admit: 2018-04-17 | Discharge: 2018-04-17 | Disposition: A | Discharge status: Home / Self Care | Payer: Medicare Other | Attending: Emergency Medicine | Admitting: Emergency Medicine

## 2018-04-17 ENCOUNTER — Encounter (HOSPITAL_COMMUNITY): Payer: Self-pay

## 2018-04-17 DIAGNOSIS — F17210 Nicotine dependence, cigarettes, uncomplicated: Secondary | ICD-10-CM | POA: Diagnosis not present

## 2018-04-17 DIAGNOSIS — R253 Fasciculation: Secondary | ICD-10-CM | POA: Diagnosis not present

## 2018-04-17 DIAGNOSIS — I10 Essential (primary) hypertension: Secondary | ICD-10-CM | POA: Insufficient documentation

## 2018-04-17 DIAGNOSIS — Z7902 Long term (current) use of antithrombotics/antiplatelets: Secondary | ICD-10-CM | POA: Diagnosis not present

## 2018-04-17 DIAGNOSIS — J449 Chronic obstructive pulmonary disease, unspecified: Secondary | ICD-10-CM | POA: Diagnosis not present

## 2018-04-17 DIAGNOSIS — Z79899 Other long term (current) drug therapy: Secondary | ICD-10-CM | POA: Insufficient documentation

## 2018-04-17 DIAGNOSIS — Z8673 Personal history of transient ischemic attack (TIA), and cerebral infarction without residual deficits: Secondary | ICD-10-CM | POA: Diagnosis not present

## 2018-04-17 DIAGNOSIS — G514 Facial myokymia: Secondary | ICD-10-CM | POA: Diagnosis not present

## 2018-04-17 DIAGNOSIS — Z7982 Long term (current) use of aspirin: Secondary | ICD-10-CM | POA: Diagnosis not present

## 2018-04-17 DIAGNOSIS — F0390 Unspecified dementia without behavioral disturbance: Secondary | ICD-10-CM | POA: Insufficient documentation

## 2018-04-17 LAB — COMPREHENSIVE METABOLIC PANEL
ALK PHOS: 94 U/L (ref 38–126)
ALT: 21 U/L (ref 17–63)
AST: 25 U/L (ref 15–41)
Albumin: 3.5 g/dL (ref 3.5–5.0)
Anion gap: 12 (ref 5–15)
BILIRUBIN TOTAL: 0.5 mg/dL (ref 0.3–1.2)
BUN: 37 mg/dL — AB (ref 6–20)
CALCIUM: 9 mg/dL (ref 8.9–10.3)
CHLORIDE: 115 mmol/L — AB (ref 101–111)
CO2: 18 mmol/L — ABNORMAL LOW (ref 22–32)
CREATININE: 2.71 mg/dL — AB (ref 0.61–1.24)
GFR calc Af Amer: 24 mL/min — ABNORMAL LOW (ref >60)
GFR, EST NON AFRICAN AMERICAN: 21 mL/min — AB (ref >60)
Glucose, Bld: 101 mg/dL — ABNORMAL HIGH (ref 65–99)
Potassium: 4.2 mmol/L (ref 3.5–5.1)
Sodium: 145 mmol/L (ref 135–145)
Total Protein: 7.4 g/dL (ref 6.5–8.1)

## 2018-04-17 LAB — CBC
HEMATOCRIT: 42.6 % (ref 39.0–52.0)
HEMOGLOBIN: 14.4 g/dL (ref 13.0–17.0)
MCH: 28.9 pg (ref 26.0–34.0)
MCHC: 33.8 g/dL (ref 30.0–36.0)
MCV: 85.4 fL (ref 78.0–100.0)
PLATELETS: 246 10*3/uL (ref 150–400)
RBC: 4.99 MIL/uL (ref 4.22–5.81)
RDW: 14.4 % (ref 11.5–15.5)
WBC: 10.4 10*3/uL (ref 4.0–10.5)

## 2018-04-17 LAB — MAGNESIUM: MAGNESIUM: 2.2 mg/dL (ref 1.7–2.4)

## 2018-04-17 NOTE — ED Notes (Signed)
Vital signs stable. 

## 2018-04-17 NOTE — ED Notes (Signed)
ED Provider at bedside. 

## 2018-04-17 NOTE — Discharge Instructions (Addendum)
This does not appear consistent with a stroke or seizure.  Your electrolytes are normal and your kidney function is improving.  You declined the sedating medicine that could help the spasms. Follow up with your neurologist tomorrow.  Return to the ED if you wish to be reevaluated or things get worse.

## 2018-04-17 NOTE — ED Notes (Signed)
Family at bedside. 

## 2018-04-17 NOTE — ED Triage Notes (Signed)
Pt face started twitching last night close to midnight.  Left side of face jerks. Does not complain of pain.

## 2018-04-17 NOTE — ED Provider Notes (Signed)
[facility] EMERGENCY DEPARTMENT Provider Note   CSN: [id number] Arrival date & time: 04/17/18  0457     History   Chief Complaint Chief Complaint  Patient presents with  . Facial Twitching    HPI Glenn [name] is a 77 y.o. male.  Patient presents with painless twitching of his mouth and lower jaw that onset around midnight.  It lasts for a few seconds at a time before resolving on its own but quickly returns.  Family states this happened last year and was attributed to an overdose of his losartan and resolved after couple days.  He has not had any issues since.  Family denies any possibility of overdose.  Medications have remained the same the PCP recently started donepezil, quetiapine and bupropion within the past 2 months.  Patient denies any pain.  No fever, chills, nausea, vomiting.  No chest pain or shortness of breath.  No fever.  No falls since patient was last seen in the ED on April 2.  The history is provided by the patient and a relative.    Past Medical History:  Diagnosis Date  . Back pain   . COPD (chronic obstructive pulmonary disease) ([place])   . Dementia   . GERD (gastroesophageal reflux disease)   . HTN (hypertension)   . Hyperlipidemia   . Hypogonadism in male   . Memory changes   . Stroke [facility])    2001 no residual symptom, 2013 no residual symptom  . Tobacco use     Patient Active Problem List   Diagnosis Date Noted  . Vascular dementia with behavioral disturbance 08/12/2017  . Excessive daytime sleepiness 08/09/2015  . CVA (cerebral vascular accident) ([place]) 08/09/2015  . COPD exacerbation ([place]) 06/10/2015  . Hemiparesis affecting right side as late effect of cerebrovascular accident ([place]) 06/10/2015  . Opioid dependence with opioid-induced sleep disorder ([place]) 06/10/2015  . Impairment of visual perception 12/19/2013  . Lack of coordination 12/19/2013  . Muscle weakness (generalized) 12/19/2013  . Aphasia following cerebrovascular disease  12/11/2013  . Acute ischemic stroke ([place]) 11/03/2013  . Acute renal failure ([place]) 11/03/2013  . COPD (chronic obstructive pulmonary disease) ([place]) 08/06/2012  . Tobacco abuse 08/06/2012  . Memory difficulties 08/06/2012  . Hyperlipidemia 08/06/2012  . CVA (cerebral infarction) 08/05/2012  . HTN (hypertension) 08/05/2012  . CONSTIPATION 05/14/2009  . CHANGE IN BOWELS 05/14/2009  . COLONIC POLYPS, HX OF 05/14/2009  . SHOULDER, ARTHRITIS, DEGEN./OSTEO 03/14/2008  . SHOULDER PAIN 03/14/2008  . IMPINGEMENT SYNDROME 03/14/2008  . RUPTURE ROTATOR CUFF 03/14/2008    Past Surgical History:  Procedure Laterality Date  . BACK SURGERY     4 surgeries  . BACK SURGERY    . CARPAL TUNNEL RELEASE Bilateral    2 surger  . stroke          Home Medications    Prior to Admission medications   Medication Sig Start Date End Date Taking? Authorizing Provider  donepezil (ARICEPT) 5 MG tablet Take 5 mg by mouth at bedtime.   Yes [provider]  memantine (NAMENDA) 10 MG tablet TAKE (1) TABLET BY MOUTH TWICE DAILY. 11/15/17  Yes Dohmeier, Asencion Partridge, MD  QUEtiapine (SEROQUEL) 100 MG tablet Take 100 mg by mouth at bedtime.   Yes [provider]  amLODipine (NORVASC) 10 MG tablet Take 10 mg by mouth daily.    [provider]  aspirin 325 MG EC tablet Take 1 tablet (325 mg total) by mouth daily. 11/04/13   Janece Canterbury,  MD  atorvastatin (LIPITOR) 20 MG tablet Take 1 tablet (20 mg total) by mouth daily at 6 PM. Patient taking differently: Take 20 mg by mouth daily.  11/04/13   Janece Canterbury, MD  buPROPion (WELLBUTRIN) 100 MG tablet Take 100 mg by mouth 2 (two) times daily.     [provider]  cholecalciferol (VITAMIN D) 1000 units tablet Take 2,000 Units by mouth at bedtime.    [provider]  clopidogrel (PLAVIX) 75 MG tablet Take 1 tablet (75 mg total) by mouth daily with breakfast. 11/04/13   Janece Canterbury, MD  diphenhydrAMINE (BENADRYL) 25 MG  tablet Take 50 mg by mouth at bedtime as needed for sleep.    [provider]  losartan (COZAAR) 100 MG tablet Take 50 mg by mouth daily.     [provider]  Melatonin 3 MG TABS Take 1 tablet by mouth at bedtime.    [provider]  Multiple Vitamin (MULTIVITAMIN) tablet Take 1 tablet by mouth daily.    [provider]  omeprazole (PRILOSEC) 40 MG capsule Take 40 mg by mouth daily.     [provider]  OVER THE COUNTER MEDICATION VITAMIN E 180mg  po daily    [provider]  Oxycodone HCl 20 MG TABS Take 20 mg by mouth 2 (two) times daily as needed (for pain).     [provider]  hydrochlorothiazide (HYDRODIURIL) 25 MG tablet Take 25 mg by mouth daily.    03/08/12  [provider]  lisinopril (PRINIVIL,ZESTRIL) 40 MG tablet Take 40 mg by mouth daily. 1/2 tab   03/08/12  [provider]  simvastatin (ZOCOR) 80 MG tablet Take 80 mg by mouth at bedtime. 1/2 tab daily   03/08/12  [provider]    Family History Family History  Problem Relation Age of Onset  . Other Father   . High blood pressure Maternal Grandmother   . High blood pressure Maternal Grandfather   . Stroke Maternal Aunt   . Stroke Maternal Uncle   . Diabetes Neg Hx   . Cancer Neg Hx     Social History Social History   Tobacco Use  . Smoking status: Current Every Day Smoker    Packs/day: 1.50    Years: 50.00    Pack years: 75.00    Types: Cigarettes  . Smokeless tobacco: Never Used  Substance Use Topics  . Alcohol use: No  . Drug use: No    Types: Oxycodone     Allergies   Patient has no known allergies.   Review of Systems Review of Systems  Constitutional: Negative for activity change and appetite change.  HENT: Negative for congestion and rhinorrhea.   Respiratory: Negative for apnea, chest tightness and shortness of breath.   Cardiovascular: Negative for chest pain.  Gastrointestinal: Negative for abdominal pain,  nausea and vomiting.  Genitourinary: Negative for dysuria, hematuria, testicular pain and urgency.  Musculoskeletal: Negative for arthralgias and myalgias.  Skin: Negative for rash.  Neurological: Positive for tremors. Negative for dizziness, light-headedness, numbness and headaches.    all other systems are negative except as noted in the HPI and PMH.    Physical Exam Updated Vital Signs BP (!) 171/87 (BP Location: Left Arm)   Pulse 89   Temp 98.2 F (36.8 C) (Oral)   Resp (!) 22   Ht 5\' 9"  (1.753 m)   Wt 68.9 kg (152 lb)   SpO2 96%   BMI 22.45 kg/m   Physical Exam  Constitutional: He is oriented to person, place, and time. He appears well-developed and well-nourished. No distress.  HENT:  Head: Normocephalic and atraumatic.  Mouth/Throat: Oropharynx is clear and moist. No oropharyngeal exudate.  Rhythmic twitching of mouth and jaw lasting for a few seconds at a time No facial droop.  Tongue is midline.  No dysarthria  Eyes: Pupils are equal, round, and reactive to light. Conjunctivae and EOM are normal.  Neck: Normal range of motion. Neck supple.  No meningismus.  Cardiovascular: Normal rate, regular rhythm, normal heart sounds and intact distal pulses.  No murmur heard. Pulmonary/Chest: Effort normal and breath sounds normal. No respiratory distress. He exhibits no tenderness.  Abdominal: Soft. There is no tenderness. There is no rebound and no guarding.  Musculoskeletal: Normal range of motion. He exhibits no edema or tenderness.  Neurological: He is alert and oriented to person, place, and time. No cranial nerve deficit. He exhibits normal muscle tone. Coordination normal.  No ataxia on finger to nose bilaterally. No pronator drift. 5/5 strength throughout. CN 2-12 intact.Equal grip strength. Sensation intact.   Skin: Skin is warm. Capillary refill takes less than 2 seconds. No rash noted.  Psychiatric: He has a normal mood and affect. His behavior is normal.  Nursing  note and vitals reviewed.    ED Treatments / Results  Labs (all labs ordered are listed, but only abnormal results are displayed) Labs Reviewed  COMPREHENSIVE METABOLIC PANEL - Abnormal; Notable for the following components:      Result Value   Chloride 115 (*)    CO2 18 (*)    Glucose, Bld 101 (*)    BUN 37 (*)    Creatinine, Ser 2.71 (*)    GFR calc non Af Amer 21 (*)    GFR calc Af Amer 24 (*)    All other components within normal limits  CBC  MAGNESIUM    EKG None  Radiology No results found.  Procedures Procedures (including critical care time)  Medications Ordered in ED Medications - No data to display   Initial Impression / Assessment and Plan / ED Course  I have reviewed the triage vital signs and the nursing notes.  Pertinent labs & imaging results that were available during my care of the patient were reviewed by me and considered in my medical decision making (see chart for details).    Twitching of the lower face, does not appear consistent with seizure.  No pain.  Records reviewed patient had similar episode in March 2018.  He had EEG which was negative.  Discussed with Dr. Leonel Ramsay of neurology.  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  He doubts seizure.  Could try low-dose benzodiazepine. States teleneuro visualization might be beneficial.   Electrolytes are reassuring.  Potassium and calcium are normal.  Offered tele-neurology consultation so neurologist can visualize spasms but patient and family do not want to wait.  Discussed low-dose benzodiazepine for possible symptom control and also discussed side effects.  Patient does not want to try this. They state they will call their neurologist tomorrow morning.  Advised he can return anytime if he wishes to be reevaluated.  Final Clinical Impressions(s) / ED Diagnoses   Final diagnoses:  Facial twitching    ED Discharge Orders    None       Chalise Pe, Annie Main, MD 04/17/18  [phone]

## 2018-04-18 ENCOUNTER — Telehealth: Payer: Self-pay | Admitting: Neurology

## 2018-04-18 DIAGNOSIS — I129 Hypertensive chronic kidney disease with stage 1 through stage 4 chronic kidney disease, or unspecified chronic kidney disease: Secondary | ICD-10-CM | POA: Diagnosis not present

## 2018-04-18 DIAGNOSIS — G894 Chronic pain syndrome: Secondary | ICD-10-CM | POA: Diagnosis not present

## 2018-04-18 DIAGNOSIS — J449 Chronic obstructive pulmonary disease, unspecified: Secondary | ICD-10-CM | POA: Diagnosis not present

## 2018-04-18 DIAGNOSIS — N183 Chronic kidney disease, stage 3 (moderate): Secondary | ICD-10-CM | POA: Diagnosis not present

## 2018-04-18 DIAGNOSIS — F17210 Nicotine dependence, cigarettes, uncomplicated: Secondary | ICD-10-CM | POA: Diagnosis not present

## 2018-04-18 DIAGNOSIS — F0390 Unspecified dementia without behavioral disturbance: Secondary | ICD-10-CM | POA: Diagnosis not present

## 2018-04-18 NOTE — Telephone Encounter (Signed)
Pt wife(on DPR) has called stating that a year ago pt was seen for muscle twitching in his face and it went away.  Wife states 2 nights ago it happened again, wife took pt to the hospital. Wife states provider that saw him could not explain but did advise her to contact pt's Neurologist.  Wife was reminded of 1 year f/u in Aug with NP. She then asked for Dr Dohmeier's 1st available, when told it would be in August wife stated again how hospital wants pt to be seen before then.  Wife has been made aware that Dr Brett Fairy is out of the office this week.  She is asking for a call from RN to discuss the face twitching that has sence stopped.

## 2018-04-18 NOTE — Telephone Encounter (Signed)
I called the wife back to make her aware that Ward Givens, NP had a cancellation on wed morning and wanted to offer that apt to them. Its at 9:30 am on 4/24. I have LVM informing them to call us back and let us know if this is something that would work for them to call us back. I also informed them that if this was something Ward Givens is not comfortable seeing since this is a visit post ED I will call them back and make them aware.   If pt calls back please offer that slot on wed at 9:30. I have blocked it in hopes they will take it

## 2018-04-19 DIAGNOSIS — F17210 Nicotine dependence, cigarettes, uncomplicated: Secondary | ICD-10-CM | POA: Diagnosis not present

## 2018-04-19 DIAGNOSIS — F0390 Unspecified dementia without behavioral disturbance: Secondary | ICD-10-CM | POA: Diagnosis not present

## 2018-04-19 DIAGNOSIS — I129 Hypertensive chronic kidney disease with stage 1 through stage 4 chronic kidney disease, or unspecified chronic kidney disease: Secondary | ICD-10-CM | POA: Diagnosis not present

## 2018-04-19 DIAGNOSIS — J449 Chronic obstructive pulmonary disease, unspecified: Secondary | ICD-10-CM | POA: Diagnosis not present

## 2018-04-19 DIAGNOSIS — N183 Chronic kidney disease, stage 3 (moderate): Secondary | ICD-10-CM | POA: Diagnosis not present

## 2018-04-19 DIAGNOSIS — G894 Chronic pain syndrome: Secondary | ICD-10-CM | POA: Diagnosis not present

## 2018-04-22 DIAGNOSIS — F17210 Nicotine dependence, cigarettes, uncomplicated: Secondary | ICD-10-CM | POA: Diagnosis not present

## 2018-04-22 DIAGNOSIS — J449 Chronic obstructive pulmonary disease, unspecified: Secondary | ICD-10-CM | POA: Diagnosis not present

## 2018-04-22 DIAGNOSIS — F0390 Unspecified dementia without behavioral disturbance: Secondary | ICD-10-CM | POA: Diagnosis not present

## 2018-04-22 DIAGNOSIS — N183 Chronic kidney disease, stage 3 (moderate): Secondary | ICD-10-CM | POA: Diagnosis not present

## 2018-04-22 DIAGNOSIS — I129 Hypertensive chronic kidney disease with stage 1 through stage 4 chronic kidney disease, or unspecified chronic kidney disease: Secondary | ICD-10-CM | POA: Diagnosis not present

## 2018-04-22 DIAGNOSIS — G894 Chronic pain syndrome: Secondary | ICD-10-CM | POA: Diagnosis not present

## 2018-04-27 DIAGNOSIS — I129 Hypertensive chronic kidney disease with stage 1 through stage 4 chronic kidney disease, or unspecified chronic kidney disease: Secondary | ICD-10-CM | POA: Diagnosis not present

## 2018-04-27 DIAGNOSIS — N183 Chronic kidney disease, stage 3 (moderate): Secondary | ICD-10-CM | POA: Diagnosis not present

## 2018-04-27 DIAGNOSIS — G894 Chronic pain syndrome: Secondary | ICD-10-CM | POA: Diagnosis not present

## 2018-04-27 DIAGNOSIS — F0390 Unspecified dementia without behavioral disturbance: Secondary | ICD-10-CM | POA: Diagnosis not present

## 2018-04-27 DIAGNOSIS — F17210 Nicotine dependence, cigarettes, uncomplicated: Secondary | ICD-10-CM | POA: Diagnosis not present

## 2018-04-27 DIAGNOSIS — J449 Chronic obstructive pulmonary disease, unspecified: Secondary | ICD-10-CM | POA: Diagnosis not present

## 2018-05-03 DIAGNOSIS — F0390 Unspecified dementia without behavioral disturbance: Secondary | ICD-10-CM | POA: Diagnosis not present

## 2018-05-03 DIAGNOSIS — N183 Chronic kidney disease, stage 3 (moderate): Secondary | ICD-10-CM | POA: Diagnosis not present

## 2018-05-03 DIAGNOSIS — I129 Hypertensive chronic kidney disease with stage 1 through stage 4 chronic kidney disease, or unspecified chronic kidney disease: Secondary | ICD-10-CM | POA: Diagnosis not present

## 2018-05-03 DIAGNOSIS — F17210 Nicotine dependence, cigarettes, uncomplicated: Secondary | ICD-10-CM | POA: Diagnosis not present

## 2018-05-03 DIAGNOSIS — J449 Chronic obstructive pulmonary disease, unspecified: Secondary | ICD-10-CM | POA: Diagnosis not present

## 2018-05-03 DIAGNOSIS — G894 Chronic pain syndrome: Secondary | ICD-10-CM | POA: Diagnosis not present

## 2018-05-04 DIAGNOSIS — N183 Chronic kidney disease, stage 3 (moderate): Secondary | ICD-10-CM | POA: Diagnosis not present

## 2018-05-04 DIAGNOSIS — I129 Hypertensive chronic kidney disease with stage 1 through stage 4 chronic kidney disease, or unspecified chronic kidney disease: Secondary | ICD-10-CM | POA: Diagnosis not present

## 2018-05-04 DIAGNOSIS — F0390 Unspecified dementia without behavioral disturbance: Secondary | ICD-10-CM | POA: Diagnosis not present

## 2018-05-04 DIAGNOSIS — J449 Chronic obstructive pulmonary disease, unspecified: Secondary | ICD-10-CM | POA: Diagnosis not present

## 2018-05-04 DIAGNOSIS — F17210 Nicotine dependence, cigarettes, uncomplicated: Secondary | ICD-10-CM | POA: Diagnosis not present

## 2018-05-04 DIAGNOSIS — G894 Chronic pain syndrome: Secondary | ICD-10-CM | POA: Diagnosis not present

## 2018-05-06 DIAGNOSIS — F0390 Unspecified dementia without behavioral disturbance: Secondary | ICD-10-CM | POA: Diagnosis not present

## 2018-05-06 DIAGNOSIS — F17210 Nicotine dependence, cigarettes, uncomplicated: Secondary | ICD-10-CM | POA: Diagnosis not present

## 2018-05-06 DIAGNOSIS — J449 Chronic obstructive pulmonary disease, unspecified: Secondary | ICD-10-CM | POA: Diagnosis not present

## 2018-05-06 DIAGNOSIS — G894 Chronic pain syndrome: Secondary | ICD-10-CM | POA: Diagnosis not present

## 2018-05-06 DIAGNOSIS — N183 Chronic kidney disease, stage 3 (moderate): Secondary | ICD-10-CM | POA: Diagnosis not present

## 2018-05-06 DIAGNOSIS — I129 Hypertensive chronic kidney disease with stage 1 through stage 4 chronic kidney disease, or unspecified chronic kidney disease: Secondary | ICD-10-CM | POA: Diagnosis not present

## 2018-05-10 DIAGNOSIS — N183 Chronic kidney disease, stage 3 (moderate): Secondary | ICD-10-CM | POA: Diagnosis not present

## 2018-05-10 DIAGNOSIS — J449 Chronic obstructive pulmonary disease, unspecified: Secondary | ICD-10-CM | POA: Diagnosis not present

## 2018-05-10 DIAGNOSIS — I129 Hypertensive chronic kidney disease with stage 1 through stage 4 chronic kidney disease, or unspecified chronic kidney disease: Secondary | ICD-10-CM | POA: Diagnosis not present

## 2018-05-10 DIAGNOSIS — G894 Chronic pain syndrome: Secondary | ICD-10-CM | POA: Diagnosis not present

## 2018-05-10 DIAGNOSIS — F17210 Nicotine dependence, cigarettes, uncomplicated: Secondary | ICD-10-CM | POA: Diagnosis not present

## 2018-05-10 DIAGNOSIS — F0390 Unspecified dementia without behavioral disturbance: Secondary | ICD-10-CM | POA: Diagnosis not present

## 2018-05-12 DIAGNOSIS — G894 Chronic pain syndrome: Secondary | ICD-10-CM | POA: Diagnosis not present

## 2018-05-12 DIAGNOSIS — I129 Hypertensive chronic kidney disease with stage 1 through stage 4 chronic kidney disease, or unspecified chronic kidney disease: Secondary | ICD-10-CM | POA: Diagnosis not present

## 2018-05-12 DIAGNOSIS — J449 Chronic obstructive pulmonary disease, unspecified: Secondary | ICD-10-CM | POA: Diagnosis not present

## 2018-05-12 DIAGNOSIS — F17210 Nicotine dependence, cigarettes, uncomplicated: Secondary | ICD-10-CM | POA: Diagnosis not present

## 2018-05-12 DIAGNOSIS — F0390 Unspecified dementia without behavioral disturbance: Secondary | ICD-10-CM | POA: Diagnosis not present

## 2018-05-12 DIAGNOSIS — N183 Chronic kidney disease, stage 3 (moderate): Secondary | ICD-10-CM | POA: Diagnosis not present

## 2018-05-19 DIAGNOSIS — I129 Hypertensive chronic kidney disease with stage 1 through stage 4 chronic kidney disease, or unspecified chronic kidney disease: Secondary | ICD-10-CM | POA: Diagnosis not present

## 2018-05-19 DIAGNOSIS — F17210 Nicotine dependence, cigarettes, uncomplicated: Secondary | ICD-10-CM | POA: Diagnosis not present

## 2018-05-19 DIAGNOSIS — N183 Chronic kidney disease, stage 3 (moderate): Secondary | ICD-10-CM | POA: Diagnosis not present

## 2018-05-19 DIAGNOSIS — G894 Chronic pain syndrome: Secondary | ICD-10-CM | POA: Diagnosis not present

## 2018-05-19 DIAGNOSIS — J449 Chronic obstructive pulmonary disease, unspecified: Secondary | ICD-10-CM | POA: Diagnosis not present

## 2018-05-19 DIAGNOSIS — F0390 Unspecified dementia without behavioral disturbance: Secondary | ICD-10-CM | POA: Diagnosis not present

## 2018-06-28 DIAGNOSIS — Z0001 Encounter for general adult medical examination with abnormal findings: Secondary | ICD-10-CM | POA: Diagnosis not present

## 2018-06-28 DIAGNOSIS — I639 Cerebral infarction, unspecified: Secondary | ICD-10-CM | POA: Diagnosis not present

## 2018-06-28 DIAGNOSIS — Z1389 Encounter for screening for other disorder: Secondary | ICD-10-CM | POA: Diagnosis not present

## 2018-06-28 DIAGNOSIS — Z6823 Body mass index (BMI) 23.0-23.9, adult: Secondary | ICD-10-CM | POA: Diagnosis not present

## 2018-06-28 DIAGNOSIS — M1991 Primary osteoarthritis, unspecified site: Secondary | ICD-10-CM | POA: Diagnosis not present

## 2018-06-28 DIAGNOSIS — J449 Chronic obstructive pulmonary disease, unspecified: Secondary | ICD-10-CM | POA: Diagnosis not present

## 2018-06-28 DIAGNOSIS — G894 Chronic pain syndrome: Secondary | ICD-10-CM | POA: Diagnosis not present

## 2018-06-28 DIAGNOSIS — R413 Other amnesia: Secondary | ICD-10-CM | POA: Diagnosis not present

## 2018-07-15 ENCOUNTER — Encounter: Payer: Self-pay | Admitting: Neurology

## 2018-08-16 ENCOUNTER — Emergency Department (HOSPITAL_COMMUNITY): Payer: Medicare Other

## 2018-08-16 ENCOUNTER — Other Ambulatory Visit: Payer: Self-pay

## 2018-08-16 ENCOUNTER — Encounter (HOSPITAL_COMMUNITY): Payer: Self-pay | Admitting: Emergency Medicine

## 2018-08-16 ENCOUNTER — Emergency Department (HOSPITAL_COMMUNITY)
Admission: EM | Admit: 2018-08-16 | Discharge: 2018-08-17 | Disposition: A | Discharge status: Home / Self Care | Payer: Medicare Other | Attending: Emergency Medicine | Admitting: Emergency Medicine

## 2018-08-16 ENCOUNTER — Ambulatory Visit: Payer: Medicare Other | Admitting: Adult Health

## 2018-08-16 DIAGNOSIS — J181 Lobar pneumonia, unspecified organism: Secondary | ICD-10-CM | POA: Diagnosis not present

## 2018-08-16 DIAGNOSIS — E785 Hyperlipidemia, unspecified: Secondary | ICD-10-CM | POA: Diagnosis not present

## 2018-08-16 DIAGNOSIS — R918 Other nonspecific abnormal finding of lung field: Secondary | ICD-10-CM | POA: Diagnosis not present

## 2018-08-16 DIAGNOSIS — Z7902 Long term (current) use of antithrombotics/antiplatelets: Secondary | ICD-10-CM | POA: Diagnosis not present

## 2018-08-16 DIAGNOSIS — W19XXXA Unspecified fall, initial encounter: Secondary | ICD-10-CM | POA: Diagnosis not present

## 2018-08-16 DIAGNOSIS — J189 Pneumonia, unspecified organism: Secondary | ICD-10-CM

## 2018-08-16 DIAGNOSIS — I10 Essential (primary) hypertension: Secondary | ICD-10-CM | POA: Insufficient documentation

## 2018-08-16 DIAGNOSIS — J449 Chronic obstructive pulmonary disease, unspecified: Secondary | ICD-10-CM | POA: Insufficient documentation

## 2018-08-16 DIAGNOSIS — F0390 Unspecified dementia without behavioral disturbance: Secondary | ICD-10-CM | POA: Diagnosis not present

## 2018-08-16 DIAGNOSIS — F17210 Nicotine dependence, cigarettes, uncomplicated: Secondary | ICD-10-CM | POA: Diagnosis not present

## 2018-08-16 DIAGNOSIS — J849 Interstitial pulmonary disease, unspecified: Secondary | ICD-10-CM | POA: Diagnosis not present

## 2018-08-16 DIAGNOSIS — R55 Syncope and collapse: Secondary | ICD-10-CM | POA: Diagnosis not present

## 2018-08-16 DIAGNOSIS — Z7982 Long term (current) use of aspirin: Secondary | ICD-10-CM | POA: Diagnosis not present

## 2018-08-16 DIAGNOSIS — R42 Dizziness and giddiness: Secondary | ICD-10-CM | POA: Diagnosis not present

## 2018-08-16 DIAGNOSIS — R070 Pain in throat: Secondary | ICD-10-CM | POA: Diagnosis not present

## 2018-08-16 DIAGNOSIS — I959 Hypotension, unspecified: Secondary | ICD-10-CM | POA: Diagnosis not present

## 2018-08-16 DIAGNOSIS — Z79899 Other long term (current) drug therapy: Secondary | ICD-10-CM | POA: Diagnosis not present

## 2018-08-16 DIAGNOSIS — Z8673 Personal history of transient ischemic attack (TIA), and cerebral infarction without residual deficits: Secondary | ICD-10-CM | POA: Insufficient documentation

## 2018-08-16 DIAGNOSIS — R404 Transient alteration of awareness: Secondary | ICD-10-CM | POA: Diagnosis not present

## 2018-08-16 LAB — TROPONIN I: Troponin I: <0.03 ng/mL (ref <0.03)

## 2018-08-16 LAB — BASIC METABOLIC PANEL
Anion gap: 6 (ref 5–15)
BUN: 42 mg/dL — ABNORMAL HIGH (ref 8–23)
CHLORIDE: 113 mmol/L — AB (ref 98–111)
CO2: 22 mmol/L (ref 22–32)
CREATININE: 3.56 mg/dL — AB (ref 0.61–1.24)
Calcium: 8.1 mg/dL — ABNORMAL LOW (ref 8.9–10.3)
GFR calc Af Amer: 18 mL/min — ABNORMAL LOW (ref >60)
GFR calc non Af Amer: 15 mL/min — ABNORMAL LOW (ref >60)
Glucose, Bld: 122 mg/dL — ABNORMAL HIGH (ref 70–99)
POTASSIUM: 4.9 mmol/L (ref 3.5–5.1)
Sodium: 141 mmol/L (ref 135–145)

## 2018-08-16 LAB — CBC
HEMATOCRIT: 36.7 % — AB (ref 39.0–52.0)
HEMOGLOBIN: 12.2 g/dL — AB (ref 13.0–17.0)
MCH: 29.5 pg (ref 26.0–34.0)
MCHC: 33.2 g/dL (ref 30.0–36.0)
MCV: 88.6 fL (ref 78.0–100.0)
PLATELETS: 228 10*3/uL (ref 150–400)
RBC: 4.14 MIL/uL — AB (ref 4.22–5.81)
RDW: 14 % (ref 11.5–15.5)
WBC: 21.5 10*3/uL — ABNORMAL HIGH (ref 4.0–10.5)

## 2018-08-16 LAB — CBG MONITORING, ED: Glucose-Capillary: 138 mg/dL — ABNORMAL HIGH (ref 70–99)

## 2018-08-16 MED ORDER — SODIUM CHLORIDE 0.9 % IV SOLN
500.0000 mg | INTRAVENOUS | Status: DC
  Administered 2018-08-16: 500 mg via INTRAVENOUS
  Filled 2018-08-16: qty 500

## 2018-08-16 MED ORDER — AZITHROMYCIN 250 MG PO TABS: 250.0000 mg | ORAL_TABLET | Freq: Every day | ORAL | 0 refills | Status: DC

## 2018-08-16 MED ORDER — SODIUM CHLORIDE 0.9 % IV SOLN
Freq: Once | INTRAVENOUS | Status: AC
  Administered 2018-08-16: 20:00:00 via INTRAVENOUS

## 2018-08-16 MED ORDER — SODIUM CHLORIDE 0.9 % IV BOLUS: 1000.0000 mL | Freq: Once | INTRAVENOUS | Status: DC

## 2018-08-16 MED ORDER — SODIUM CHLORIDE 0.9 % IV SOLN
1.0000 g | Freq: Once | INTRAVENOUS | Status: AC
  Administered 2018-08-16: 1 g via INTRAVENOUS
  Filled 2018-08-16: qty 10

## 2018-08-16 NOTE — ED Provider Notes (Signed)
[facility] EMERGENCY DEPARTMENT Provider Note   CSN: [id number] Arrival date & time: 08/16/18  1929     History   Chief Complaint Chief Complaint  Patient presents with  . Loss of Consciousness  . Hypotension    HPI Glenn [name] is a 77 y.o. male.  Level 5 caveat for dementia.  Most of history obtained from wife.  Wife reports patient was at the kitchen sink when he allegedly started shaking and almost passed out.  No history of seizures.  She reports general malaise the past 24 hours with poor intake and vomiting.  No respiratory distress.  Past medical history includes stroke x2, dementia, AKI, hypertension.  No known history of diabetes.  He is a smoker.  He is back to baseline in the emergency department.     Past Medical History:  Diagnosis Date  . Back pain   . COPD (chronic obstructive pulmonary disease) ([place])   . Dementia   . GERD (gastroesophageal reflux disease)   . HTN (hypertension)   . Hyperlipidemia   . Hypogonadism in male   . Memory changes   . Stroke [facility])    2001 no residual symptom, 2013 no residual symptom  . Tobacco use     Patient Active Problem List   Diagnosis Date Noted  . Vascular dementia with behavioral disturbance 08/12/2017  . Excessive daytime sleepiness 08/09/2015  . CVA (cerebral vascular accident) ([place]) 08/09/2015  . COPD exacerbation ([place]) 06/10/2015  . Hemiparesis affecting right side as late effect of cerebrovascular accident ([place]) 06/10/2015  . Opioid dependence with opioid-induced sleep disorder ([place]) 06/10/2015  . Impairment of visual perception 12/19/2013  . Lack of coordination 12/19/2013  . Muscle weakness (generalized) 12/19/2013  . Aphasia following cerebrovascular disease 12/11/2013  . Acute ischemic stroke ([place]) 11/03/2013  . Acute renal failure ([place]) 11/03/2013  . COPD (chronic obstructive pulmonary disease) ([place]) 08/06/2012  . Tobacco abuse 08/06/2012  . Memory difficulties 08/06/2012  . Hyperlipidemia  08/06/2012  . CVA (cerebral infarction) 08/05/2012  . HTN (hypertension) 08/05/2012  . CONSTIPATION 05/14/2009  . CHANGE IN BOWELS 05/14/2009  . COLONIC POLYPS, HX OF 05/14/2009  . SHOULDER, ARTHRITIS, DEGEN./OSTEO 03/14/2008  . SHOULDER PAIN 03/14/2008  . IMPINGEMENT SYNDROME 03/14/2008  . RUPTURE ROTATOR CUFF 03/14/2008    Past Surgical History:  Procedure Laterality Date  . BACK SURGERY     4 surgeries  . BACK SURGERY    . CARPAL TUNNEL RELEASE Bilateral    2 surger  . stroke          Home Medications    Prior to Admission medications   Medication Sig Start Date End Date Taking? Authorizing Provider  aspirin 325 MG EC tablet Take 1 tablet (325 mg total) by mouth daily. 11/04/13  Yes Short, Noah Delaine, MD  atorvastatin (LIPITOR) 20 MG tablet Take 1 tablet (20 mg total) by mouth daily at 6 PM. Patient taking differently: Take 20 mg by mouth daily.  11/04/13  Yes Short, Noah Delaine, MD  buPROPion (WELLBUTRIN) 100 MG tablet Take 100 mg by mouth 2 (two) times daily.    Yes [provider]  cholecalciferol (VITAMIN D) 1000 units tablet Take 2,000 Units by mouth every morning.    Yes [provider]  clopidogrel (PLAVIX) 75 MG tablet Take 1 tablet (75 mg total) by mouth daily with breakfast. 11/04/13  Yes Short, Noah Delaine, MD  donepezil (ARICEPT) 10 MG tablet Take 10 mg by mouth at bedtime.  08/04/18  Yes [provider]  memantine (NAMENDA) 5 MG tablet Take 5 mg by mouth 2 (two) times daily.  08/08/18  Yes [provider]  mirtazapine (REMERON) 15 MG tablet Take 15 mg by mouth at bedtime. 07/28/18  Yes [provider]  Multiple Vitamin (MULTIVITAMIN) tablet Take 1 tablet by mouth daily.   Yes [provider]  omeprazole (PRILOSEC) 40 MG capsule Take 40 mg by mouth daily.    Yes [provider]  Oxycodone HCl 20 MG TABS Take 20 mg by mouth 2 (two) times daily.    Yes [provider]  QUEtiapine (SEROQUEL) 100 MG tablet  Take 100 mg by mouth at bedtime.   Yes [provider]  VITAMIN E PO Take 1 tablet by mouth every morning.   Yes [provider]  amLODipine (NORVASC) 10 MG tablet Take 10 mg by mouth daily.    [provider]  azithromycin (ZITHROMAX) 250 MG tablet Take 1 tablet (250 mg total) by mouth daily. Take first 2 tablets together, then 1 every day until finished. 08/16/18   Nat Christen, MD  hydrochlorothiazide (HYDRODIURIL) 25 MG tablet Take 25 mg by mouth daily.    03/08/12  [provider]  lisinopril (PRINIVIL,ZESTRIL) 40 MG tablet Take 40 mg by mouth daily. 1/2 tab   03/08/12  [provider]  simvastatin (ZOCOR) 80 MG tablet Take 80 mg by mouth at bedtime. 1/2 tab daily   03/08/12  [provider]    Family History Family History  Problem Relation Age of Onset  . Other Father   . High blood pressure Maternal Grandmother   . High blood pressure Maternal Grandfather   . Stroke Maternal Aunt   . Stroke Maternal Uncle   . Diabetes Neg Hx   . Cancer Neg Hx     Social History Social History   Tobacco Use  . Smoking status: Current Every Day Smoker    Packs/day: 1.50    Years: 50.00    Pack years: 75.00    Types: Cigarettes  . Smokeless tobacco: Never Used  Substance Use Topics  . Alcohol use: No  . Drug use: No    Types: Oxycodone     Allergies   Patient has no known allergies.   Review of Systems Review of Systems  Unable to perform ROS: Acuity of condition     Physical Exam Updated Vital Signs BP (!) 153/70   Pulse 68   Temp 97.9 F (36.6 C) (Oral)   Resp 16   Ht 5\' 10"  (1.778 m)   Wt 74.4 kg   SpO2 93%   BMI 23.53 kg/m   Physical Exam  Constitutional: He is oriented to person, place, and time.  Alert and oriented x3; no respiratory distress; reports normal behavior.  HENT:  Head: Normocephalic and atraumatic.  Eyes: Conjunctivae are normal.  Neck: Neck supple.  Cardiovascular: Normal rate and regular  rhythm.  Pulmonary/Chest: Effort normal and breath sounds normal.  Abdominal: Soft. Bowel sounds are normal.  Musculoskeletal: Normal range of motion.  Neurological: He is alert and oriented to person, place, and time.  Skin: Skin is warm and dry.  Psychiatric:  Flat affect.  Nursing note and vitals reviewed.    ED Treatments / Results  Labs (all labs ordered are listed, but only abnormal results are displayed) Labs Reviewed  BASIC METABOLIC PANEL - Abnormal; Notable for the following components:      Result Value   Chloride 113 (*)    Glucose, Bld 122 (*)  BUN 42 (*)    Creatinine, Ser 3.56 (*)    Calcium 8.1 (*)    GFR calc non Af Amer 15 (*)    GFR calc Af Amer 18 (*)    All other components within normal limits  CBC - Abnormal; Notable for the following components:   WBC 21.5 (*)    RBC 4.14 (*)    Hemoglobin 12.2 (*)    HCT 36.7 (*)    All other components within normal limits  CBG MONITORING, ED - Abnormal; Notable for the following components:   Glucose-Capillary 138 (*)    All other components within normal limits  TROPONIN I  URINALYSIS, ROUTINE W REFLEX MICROSCOPIC    EKG EKG Interpretation  Date/Time:  Tuesday August 16 2018 19:53:59 EDT Ventricular Rate:  73 PR Interval:    QRS Duration: 107 QT Interval:  423 QTC Calculation: 467 R Axis:   36 Text Interpretation:  Sinus rhythm Confirmed by Nat Christen [phone]) on 08/16/2018 9:12:52 PM   Radiology Dg Chest 2 View  Result Date: 08/16/2018 CLINICAL DATA:  Bibasilar opacities on AP view. EXAM: CHEST - 2 VIEW COMPARISON:  AP view earlier this day. FINDINGS: Patchy opacity at the right lung base involves the lower and possible middle lobe. Minimal streaky atelectasis at the left lung base. The heart is normal in size. Mild aortic tortuosity. No pleural effusion or pneumothorax. No acute osseous abnormalities. IMPRESSION: PA and lateral views with persistent patchy right basilar opacity involving the lower  and possibly middle lobes, suspicious for aspiration or pneumonia. Left basilar opacity is likely atelectasis. Electronically Signed   By: Jeb Levering M.D.   On: 08/16/2018 21:58   Ct Head Wo Contrast  Result Date: 08/16/2018 CLINICAL DATA:  Syncopal episode, hypotension, yesterday with sick with vomiting and anorexia EXAM: CT HEAD WITHOUT CONTRAST TECHNIQUE: Contiguous axial images were obtained from the base of the skull through the vertex without intravenous contrast. COMPARISON:  CT head 03/29/2018 FINDINGS: Brain: Generalized atrophy. Normal ventricular morphology. No midline shift or mass effect. Small vessel chronic ischemic changes of deep cerebral white matter. Small old LEFT frontal infarct. Large old posterior LEFT parietal infarct. Small old LEFT periventricular white matter infarct. Old RIGHT thalamic infarct. No intracranial hemorrhage, mass lesion, or evidence of acute infarction. No extra-axial fluid collections. Vascular: Atherosclerotic calcifications of internal carotid arteries bilaterally at skull base Skull: Intact Sinuses/Orbits: Clear Other: N/A IMPRESSION: Atrophy with small vessel chronic ischemic changes of deep cerebral white matter. Stable old infarcts as above. No acute intracranial abnormalities. Electronically Signed   By: Lavonia Dana M.D.   On: 08/16/2018 20:56   Dg Chest Port 1 View  Result Date: 08/16/2018 CLINICAL DATA:  Chest pain.  Hypotensive.  Prior stroke. EXAM: PORTABLE CHEST 1 VIEW COMPARISON:  08/05/2012 FINDINGS: Midline trachea. Normal heart size for level of inspiration. Atherosclerosis in the transverse aorta. No pleural effusion or pneumothorax. Bibasilar volume loss with right greater than left base airspace disease. IMPRESSION: Right greater than left base airspace disease. Although this could simply be related to atelectasis in the setting of volume loss, especially at the right lung base, infection or aspiration cannot be excluded. Consider PA and  lateral radiographs if possible. Aortic Atherosclerosis (ICD10-I70.0). Electronically Signed   By: Abigail Miyamoto M.D.   On: 08/16/2018 20:38    Procedures Procedures (including critical care time)  Medications Ordered in ED Medications  azithromycin (ZITHROMAX) 500 mg in sodium chloride 0.9 % 250 mL IVPB (500 mg  Intravenous New Bag/Given 08/16/18 2256)  0.9 %  sodium chloride infusion ( Intravenous New Bag/Given 08/16/18 2016)  cefTRIAXone (ROCEPHIN) 1 g in sodium chloride 0.9 % 100 mL IVPB (0 g Intravenous Stopped 08/16/18 2245)     Initial Impression / Assessment and Plan / ED Course  I have reviewed the triage vital signs and the nursing notes.  Pertinent labs & imaging results that were available during my care of the patient were reviewed by me and considered in my medical decision making (see chart for details).     Wife reports a general shaking spell with a near syncopal episode earlier this evening.  In the emergency department, he appears to be baseline per her history.  2 view chest reveals probable right basilar pneumonia.  Will initiate IV Rocephin, IV Zithromax.  Patient does not want to be admitted to the hospital.  His wife reports that he is now at baseline.  Will discharge home with Zithromax.  Final Clinical Impressions(s) / ED Diagnoses   Final diagnoses:  Community acquired pneumonia of right lower lobe of lung [facility])    ED Discharge Orders         Ordered    azithromycin (ZITHROMAX) 250 MG tablet  Daily,   Status:  Discontinued     08/16/18 2338    azithromycin (ZITHROMAX) 250 MG tablet  Daily     08/16/18 2338           Nat Christen, MD 08/16/18 2348

## 2018-08-16 NOTE — ED Triage Notes (Signed)
[place] called out for seizure like activity, upon arrival patient was hypotensive and probably having a syncopal episode.  Per wife, yesterday, sick, vomiting, not eating or drinking much.

## 2018-08-16 NOTE — ED Notes (Signed)
Patient transported to CT 

## 2018-08-16 NOTE — Discharge Instructions (Addendum)
Chest x-ray shows pneumonia on the right side of your lung.  Start your oral antibiotic medication tomorrow.  Increase fluids.  Rest.  Follow-up with your primary care doctor or return if worse

## 2018-08-17 ENCOUNTER — Encounter: Payer: Self-pay | Admitting: Adult Health

## 2018-09-20 ENCOUNTER — Other Ambulatory Visit: Payer: Self-pay

## 2018-09-20 ENCOUNTER — Encounter: Payer: Self-pay | Admitting: Neurology

## 2018-09-20 ENCOUNTER — Ambulatory Visit (INDEPENDENT_AMBULATORY_CARE_PROVIDER_SITE_OTHER): Payer: Medicare Other | Admitting: Neurology

## 2018-09-20 VITALS — BP 172/86 | HR 74 | Ht 70.0 in | Wt 146.0 lb

## 2018-09-20 DIAGNOSIS — F0391 Unspecified dementia with behavioral disturbance: Secondary | ICD-10-CM | POA: Diagnosis not present

## 2018-09-20 DIAGNOSIS — F03B18 Unspecified dementia, moderate, with other behavioral disturbance: Secondary | ICD-10-CM

## 2018-09-20 DIAGNOSIS — R2681 Unsteadiness on feet: Secondary | ICD-10-CM | POA: Diagnosis not present

## 2018-09-20 DIAGNOSIS — R413 Other amnesia: Secondary | ICD-10-CM

## 2018-09-20 NOTE — Patient Instructions (Addendum)
1. Schedule MRI brain without contrast  We have sent a referral to [facility] for your MRI and they will call you directly to schedule your appt. They are located at [place]. If you need to contact them directly please call [phone].   2. Schedule EMG/NCV of both LE with Dr. Posey Pronto 3. Use walker AT ALL TIMES 4. Recommend starting to get more help at home 5. Follow-up in 6 months or so, call for any changes  FALL PRECAUTIONS: Be cautious when walking. Scan the area for obstacles that may increase the risk of trips and falls. When getting up in the mornings, sit up at the edge of the bed for a few minutes before getting out of bed. Consider elevating the bed at the head end to avoid drop of blood pressure when getting up. Walk always in a well-lit room (use night lights in the walls). Avoid area rugs or power cords from appliances in the middle of the walkways. Use a walker or a cane if necessary and consider physical therapy for balance exercise. Get your eyesight checked regularly.  HOME SAFETY: Consider the safety of the kitchen when operating appliances like stoves, microwave oven, and blender. Consider having supervision and share cooking responsibilities until no longer able to participate in those. Accidents with firearms and other hazards in the house should be identified and addressed as well.  ABILITY TO BE LEFT ALONE: If patient is unable to contact 911 operator, consider using LifeLine, or when the need is there, arrange for someone to stay with patients. Smoking is a fire hazard, consider supervision or cessation. Risk of wandering should be assessed by caregiver and if detected at any point, supervision and safe proof recommendations should be instituted.   RECOMMENDATIONS FOR ALL PATIENTS WITH MEMORY PROBLEMS: 1. Continue to exercise (Recommend 30 minutes of walking everyday, or 3 hours every week) 2. Increase social interactions - continue going to [place] and  enjoy social gatherings with friends and family 3. Eat healthy, avoid fried foods and eat more fruits and vegetables 4. Maintain adequate blood pressure, blood sugar, and blood cholesterol level. Reducing the risk of stroke and cardiovascular disease also helps promoting better memory. 5. Avoid stressful situations. Live a simple life and avoid aggravations. Organize your time and prepare for the next day in anticipation. 6. Sleep well, avoid any interruptions of sleep and avoid any distractions in the bedroom that may interfere with adequate sleep quality 7. Avoid sugar, avoid sweets as there is a strong link between excessive sugar intake, diabetes, and cognitive impairment The Mediterranean diet has been shown to help patients reduce the risk of progressive memory disorders and reduces cardiovascular risk. This includes eating fish, eat fruits and green leafy vegetables, nuts like almonds and hazelnuts, walnuts, and also use olive oil. Avoid fast foods and fried foods as much as possible. Avoid sweets and sugar as sugar use has been linked to worsening of memory function.  There is always a concern of gradual progression of memory problems. If this is the case, then we may need to adjust level of care according to patient needs. Support, both to the patient and caregiver, should then be put into place.

## 2018-09-20 NOTE — Progress Notes (Signed)
NEUROLOGY CONSULTATION NOTE  Glenn [name] MRN: [id number] DOB: 1941-03-27  Referring provider: Dr. Redmond School Primary care provider: Dr. Redmond School  Reason for consult:  Concern for NPH  Dear Dr Gerarda Fraction:  Thank you for your kind referral of Glenn [name] for consultation of the above symptoms. Although his history is well known to you, please allow me to reiterate it for the purpose of our medical record. The patient was accompanied to the clinic by his wife who also provides collateral information. Records and images were personally reviewed where available.  HISTORY OF PRESENT ILLNESS: This is a 77 year old right-handed man with a history of hypertension, hyperlipidemia, strokes with residual mild expressive aphasia, presenting for evaluation of worsening memory and gait changes, ?NPH. After his stroke 5 years ago, his memoyr, right peripheral vision, as well as coordination and balance were affected. He stopped driving and wife took over bills after the stroke. Memory has continued to worsen, his wife started managing his medications a year ago. He would forget to turn off the water. He continues to smoke and drops his cigarettes and forgets them. He has been more irritable recently. He has always been paranoid about keeping doors closed, but swears up and down there is someone in the bedroom. He lives with his wife and 2 sons. He is independent with dressing and bathing but would not bathe often unless she reminds him. His wife reports that over the past couple of months, coordination has significantly worsened. He has had multiple falls, 5 days ago he fell twice in one day, then fell again the next day. He denies any numbness/tingling in his feet, no low back pain. He has a little neck pain and has right hand numbness. He feels a little lightheaded sometimes with blurred vision. No headaches, dysarthria/dysphagia, bladder incontinence. He has some constipation. No anosmia or  tremors. No family history of dementia. No history of significant head injuries or alcohol use. He is taking Donepezil 10mg  daily and Memantine 15mg  BID without side effects.   He was in the ER last 08/16/18 for pneumonia. His wife reports that she saw him holding on to the counter with body jerking, his BP was very low. He had a head CT which I personally reviewed, there was generalized atrophy, with normal ventricular morphology, small old left frontal infarct, large old posterior left parietal infarct, small old left periventricular white matter infarct, old right thalamic infarct. There is an MMSE of 16/30 in August 2018 when he was seeing neurologist Dr. Brett Fairy, he was diagnosed with vascular dementia in 2017.  Laboratory Data: Lab Results  Component Value Date   TSH 2.637 03/14/2017    PAST MEDICAL HISTORY: Past Medical History:  Diagnosis Date  . Back pain   . COPD (chronic obstructive pulmonary disease) ([place])   . Dementia   . GERD (gastroesophageal reflux disease)   . HTN (hypertension)   . Hyperlipidemia   . Hypogonadism in male   . Memory changes   . Stroke [facility])    2001 no residual symptom, 2013 no residual symptom  . Tobacco use     PAST SURGICAL HISTORY: Past Surgical History:  Procedure Laterality Date  . BACK SURGERY     4 surgeries  . BACK SURGERY    . CARPAL TUNNEL RELEASE Bilateral    2 surger  . stroke      MEDICATIONS: Current Outpatient Medications on File Prior to Visit  Medication Sig Dispense Refill  .  amLODipine (NORVASC) 10 MG tablet Take 10 mg by mouth daily.    Marland Kitchen aspirin 325 MG EC tablet Take 1 tablet (325 mg total) by mouth daily. 30 tablet 0  . atorvastatin (LIPITOR) 20 MG tablet Take 1 tablet (20 mg total) by mouth daily at 6 PM. (Patient taking differently: Take 20 mg by mouth daily. ) 30 tablet 0  . azithromycin (ZITHROMAX) 250 MG tablet Take 1 tablet (250 mg total) by mouth daily. Take first 2 tablets together, then 1 every day until  finished. 6 tablet 0  . buPROPion (WELLBUTRIN) 100 MG tablet Take 100 mg by mouth 2 (two) times daily.     . cholecalciferol (VITAMIN D) 1000 units tablet Take 2,000 Units by mouth every morning.     . clopidogrel (PLAVIX) 75 MG tablet Take 1 tablet (75 mg total) by mouth daily with breakfast. 30 tablet 0  . donepezil (ARICEPT) 10 MG tablet Take 10 mg by mouth at bedtime.     . memantine (NAMENDA) 5 MG tablet Take 5 mg by mouth 2 (two) times daily.     . mirtazapine (REMERON) 15 MG tablet Take 15 mg by mouth at bedtime.    . Multiple Vitamin (MULTIVITAMIN) tablet Take 1 tablet by mouth daily.    Marland Kitchen omeprazole (PRILOSEC) 40 MG capsule Take 40 mg by mouth daily.     . Oxycodone HCl 20 MG TABS Take 20 mg by mouth 2 (two) times daily.     . QUEtiapine (SEROQUEL) 100 MG tablet Take 100 mg by mouth at bedtime.    Marland Kitchen VITAMIN E PO Take 1 tablet by mouth every morning.    . [DISCONTINUED] hydrochlorothiazide (HYDRODIURIL) 25 MG tablet Take 25 mg by mouth daily.      . [DISCONTINUED] lisinopril (PRINIVIL,ZESTRIL) 40 MG tablet Take 40 mg by mouth daily. 1/2 tab     . [DISCONTINUED] simvastatin (ZOCOR) 80 MG tablet Take 80 mg by mouth at bedtime. 1/2 tab daily      No current facility-administered medications on file prior to visit.     ALLERGIES: No Known Allergies  FAMILY HISTORY: Family History  Problem Relation Age of Onset  . Other Father   . High blood pressure Maternal Grandmother   . High blood pressure Maternal Grandfather   . Stroke Maternal Aunt   . Stroke Maternal Uncle   . Diabetes Neg Hx   . Cancer Neg Hx     SOCIAL HISTORY: Social History   Socioeconomic History  . Marital status: Married    Spouse name: Not on file  . Number of children: Not on file  . Years of education: Not on file  . Highest education level: Not on file  Occupational History  . Not on file  Social Needs  . Financial resource strain: Not on file  . Food insecurity:    Worry: Not on file     Inability: Not on file  . Transportation needs:    Medical: Not on file    Non-medical: Not on file  Tobacco Use  . Smoking status: Current Every Day Smoker    Packs/day: 1.50    Years: 50.00    Pack years: 75.00    Types: Cigarettes  . Smokeless tobacco: Never Used  Substance and Sexual Activity  . Alcohol use: No  . Drug use: No    Types: Oxycodone  . Sexual activity: Yes  Lifestyle  . Physical activity:    Days per week: Not on file  Minutes per session: Not on file  . Stress: Not on file  Relationships  . Social connections:    Talks on phone: Not on file    Gets together: Not on file    Attends religious service: Not on file    Active member of club or organization: Not on file    Attends meetings of clubs or organizations: Not on file    Relationship status: Not on file  . Intimate partner violence:    Fear of current or ex partner: Not on file    Emotionally abused: Not on file    Physically abused: Not on file    Forced sexual activity: Not on file  Other Topics Concern  . Not on file  Social History Narrative   Lives with his wife and three boys.  Some stairs in the house.  Does not need any assist devices.        Drinks 4 cups of caffeine daily.    REVIEW OF SYSTEMS: Constitutional: No fevers, chills, or sweats, no generalized fatigue, change in appetite Eyes: No visual changes, double vision, eye pain Ear, nose and throat: No hearing loss, ear pain, nasal congestion, sore throat Cardiovascular: No chest pain, palpitations Respiratory:  No shortness of breath at rest or with exertion, wheezes GastrointestinaI: No nausea, vomiting, diarrhea, abdominal pain, fecal incontinence Genitourinary:  No dysuria, urinary retention or frequency Musculoskeletal:  + neck pain,no back pain Integumentary: No rash, pruritus, skin lesions Neurological: as above Psychiatric: No depression, insomnia, anxiety Endocrine: No palpitations, fatigue, diaphoresis, mood swings,  change in appetite, change in weight, increased thirst Hematologic/Lymphatic:  No anemia, purpura, petechiae. Allergic/Immunologic: no itchy/runny eyes, nasal congestion, recent allergic reactions, rashes  PHYSICAL EXAM: Vitals:   09/20/18 1045  BP: (!) 172/86  Pulse: 74  SpO2: 97%   General: No acute distress Head:  Normocephalic/atraumatic Eyes: Fundoscopic exam shows bilateral sharp discs, no vessel changes, exudates, or hemorrhages Neck: supple, no paraspinal tenderness, full range of motion Back: No paraspinal tenderness Heart: regular rate and rhythm Lungs: Clear to auscultation bilaterally. Vascular: No carotid bruits. Skin/Extremities: No rash, no edema Neurological Exam: Mental status: alert and oriented to person, place, month/year/season, no dysarthria, mild expressive aphasia with word-finding difficulties and paraphasic errors, Fund of knowledge is appropriate.  Recent and remote memory are impaired.  Attention and concentration are reduced. Able to name objects and repeat phrases. CDT 4/5 MMSE - Mini Mental State Exam 09/20/2018 08/12/2017 03/17/2017  Orientation to time 3 1 3   Orientation to Place 4 4 3   Registration 3 3 3   Attention/ Calculation 2 1 0  Recall 0 0 1  Language- name 2 objects 2 2 2   Language- repeat 1 1 1   Language- follow 3 step command 2 3 2   Language- read & follow direction 1 1 1   Write a sentence 1 0 0  Write a sentence-comments - pt had a stroke that affect his writing and expressive thinking -  Copy design 0 0 1  Total score 19 16 17    Cranial nerves: CN I: not tested CN II: pupils equal, round and reactive to light, visual fields intact, fundi unremarkable. CN III, IV, VI:  full range of motion, no nystagmus, no ptosis CN V: facial sensation intact CN VII: upper and lower face symmetric CN VIII: hearing intact to finger rub CN IX, X: gag intact, uvula midline CN XI: sternocleidomastoid and trapezius muscles intact CN XII: tongue  midline Bulk & Tone: normal, no fasciculations, no  cogwheeling Motor: 5/5 throughout with no pronator drift. Sensation: intact to light touch, cold, pin on both UE, decreased vibration to ankles bilaterally, reports stinging to pin on left foot, decreased cold on right foot.  No extinction to double simultaneous stimulation.  Romberg test positive sway Deep Tendon Reflexes: unable to elicit throughout except for brisk +3 right UE, no ankle clonus Plantar responses: downgoing bilaterally Cerebellar: no incoordination on finger to nose testing Gait: no magnetic gait noted, he has low clearance with small marching steps, unable to tandem walk Tremor: none  IMPRESSION: This is a 77 year old right-handed man with a history of  hypertension, hyperlipidemia, strokes with residual mild expressive aphasia, presenting for evaluation of worsening memory and gait changes. There is a question of normal pressure hydrocephalus, however recent head CT a month ago did not show any ventriculomegaly. 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 His MMSE today is 19/30. Symptoms suggestive of moderate dementia with behavioral disturbance. MRI brain without contrast will be ordered to assess for underlying structural abnormality. EMG/NCV of both lower extremities will be ordered to further evaluate gait complaints, possibly related to neuropathy. He was advised to use his walker at all times. His wife spoke to me separately about her concerns, we discussed getting more help at home, resources provided. Continue Donepezil and Memantine. Follow-up in 6 months, they know to call for any changes.   Thank you for allowing me to participate in the care of this patient. Please do not hesitate to call for any questions or concerns.   Ellouise Newer, M.D.  CC: Dr. Gerarda Fraction

## 2018-09-25 ENCOUNTER — Encounter: Payer: Self-pay | Admitting: Neurology

## 2018-10-06 DIAGNOSIS — J449 Chronic obstructive pulmonary disease, unspecified: Secondary | ICD-10-CM | POA: Diagnosis not present

## 2018-10-06 DIAGNOSIS — F17210 Nicotine dependence, cigarettes, uncomplicated: Secondary | ICD-10-CM | POA: Diagnosis not present

## 2018-10-06 DIAGNOSIS — N183 Chronic kidney disease, stage 3 (moderate): Secondary | ICD-10-CM | POA: Diagnosis not present

## 2018-10-06 DIAGNOSIS — I129 Hypertensive chronic kidney disease with stage 1 through stage 4 chronic kidney disease, or unspecified chronic kidney disease: Secondary | ICD-10-CM | POA: Diagnosis not present

## 2018-10-06 DIAGNOSIS — F0390 Unspecified dementia without behavioral disturbance: Secondary | ICD-10-CM | POA: Diagnosis not present

## 2018-10-06 DIAGNOSIS — R2681 Unsteadiness on feet: Secondary | ICD-10-CM | POA: Diagnosis not present

## 2018-10-10 DIAGNOSIS — F0390 Unspecified dementia without behavioral disturbance: Secondary | ICD-10-CM | POA: Diagnosis not present

## 2018-10-10 DIAGNOSIS — F17210 Nicotine dependence, cigarettes, uncomplicated: Secondary | ICD-10-CM | POA: Diagnosis not present

## 2018-10-10 DIAGNOSIS — N183 Chronic kidney disease, stage 3 (moderate): Secondary | ICD-10-CM | POA: Diagnosis not present

## 2018-10-10 DIAGNOSIS — I129 Hypertensive chronic kidney disease with stage 1 through stage 4 chronic kidney disease, or unspecified chronic kidney disease: Secondary | ICD-10-CM | POA: Diagnosis not present

## 2018-10-10 DIAGNOSIS — J449 Chronic obstructive pulmonary disease, unspecified: Secondary | ICD-10-CM | POA: Diagnosis not present

## 2018-10-10 DIAGNOSIS — R2681 Unsteadiness on feet: Secondary | ICD-10-CM | POA: Diagnosis not present

## 2018-10-12 DIAGNOSIS — F17210 Nicotine dependence, cigarettes, uncomplicated: Secondary | ICD-10-CM | POA: Diagnosis not present

## 2018-10-12 DIAGNOSIS — I129 Hypertensive chronic kidney disease with stage 1 through stage 4 chronic kidney disease, or unspecified chronic kidney disease: Secondary | ICD-10-CM | POA: Diagnosis not present

## 2018-10-12 DIAGNOSIS — R2681 Unsteadiness on feet: Secondary | ICD-10-CM | POA: Diagnosis not present

## 2018-10-12 DIAGNOSIS — F0390 Unspecified dementia without behavioral disturbance: Secondary | ICD-10-CM | POA: Diagnosis not present

## 2018-10-12 DIAGNOSIS — J449 Chronic obstructive pulmonary disease, unspecified: Secondary | ICD-10-CM | POA: Diagnosis not present

## 2018-10-12 DIAGNOSIS — N183 Chronic kidney disease, stage 3 (moderate): Secondary | ICD-10-CM | POA: Diagnosis not present

## 2018-10-13 DIAGNOSIS — F17210 Nicotine dependence, cigarettes, uncomplicated: Secondary | ICD-10-CM | POA: Diagnosis not present

## 2018-10-13 DIAGNOSIS — I129 Hypertensive chronic kidney disease with stage 1 through stage 4 chronic kidney disease, or unspecified chronic kidney disease: Secondary | ICD-10-CM | POA: Diagnosis not present

## 2018-10-13 DIAGNOSIS — J449 Chronic obstructive pulmonary disease, unspecified: Secondary | ICD-10-CM | POA: Diagnosis not present

## 2018-10-13 DIAGNOSIS — N183 Chronic kidney disease, stage 3 (moderate): Secondary | ICD-10-CM | POA: Diagnosis not present

## 2018-10-13 DIAGNOSIS — R2681 Unsteadiness on feet: Secondary | ICD-10-CM | POA: Diagnosis not present

## 2018-10-13 DIAGNOSIS — F0390 Unspecified dementia without behavioral disturbance: Secondary | ICD-10-CM | POA: Diagnosis not present

## 2018-10-18 ENCOUNTER — Telehealth: Payer: Self-pay | Admitting: Neurology

## 2018-10-18 ENCOUNTER — Ambulatory Visit (INDEPENDENT_AMBULATORY_CARE_PROVIDER_SITE_OTHER): Payer: Medicare Other | Admitting: Neurology

## 2018-10-18 ENCOUNTER — Ambulatory Visit
Admission: RE | Admit: 2018-10-18 | Discharge: 2018-10-18 | Disposition: A | Discharge status: Home / Self Care | Payer: Medicare Other | Source: Ambulatory Visit | Attending: Neurology | Admitting: Neurology

## 2018-10-18 DIAGNOSIS — R2681 Unsteadiness on feet: Secondary | ICD-10-CM

## 2018-10-18 DIAGNOSIS — F03B18 Unspecified dementia, moderate, with other behavioral disturbance: Secondary | ICD-10-CM

## 2018-10-18 DIAGNOSIS — R413 Other amnesia: Secondary | ICD-10-CM

## 2018-10-18 DIAGNOSIS — G629 Polyneuropathy, unspecified: Secondary | ICD-10-CM

## 2018-10-18 DIAGNOSIS — F0391 Unspecified dementia with behavioral disturbance: Secondary | ICD-10-CM | POA: Diagnosis not present

## 2018-10-18 NOTE — Telephone Encounter (Signed)
Pt just had MRI today

## 2018-10-18 NOTE — Telephone Encounter (Signed)
Pls let daughter know that the MRI brain did not show any evidence of recent stroke, it showed several old strokes and hardening of the blood vessels in the brain, no hydrocephalus. MRI brain does not diagnose dementia, but it does show [place] that the cause of his dementia is likely due to the multiple strokes in the past (called vascular dementia). His nerve test confirmed pinched nerves in his back and neuropathy, which is injury to the nerves in his feet. There is no medication for this except that Physical therapy may help with balance. Thanks

## 2018-10-18 NOTE — Telephone Encounter (Signed)
Patient's daughter is calling in stating that she was needing his recent MRI results. Please call back at [phone]. Thanks!

## 2018-10-18 NOTE — Procedures (Signed)
[facility]  [place], [place]  [place], [place] [address] Tel: [phone] Fax:  [phone] Test Date:  10/18/2018  Patient: Glenn [name] DOB: 05/30/1941 Physician: Narda Amber, DO  Sex: Male Height: 5\' 10"  Ref Phys: Ellouise Newer, MD  ID#: [id number] Temp: 32.8C Technician:    Patient Complaints: This is a 77 year old man referred for evaluation of gait instability and falls.  NCV & EMG Findings: Extensive electrodiagnostic testing of the right lower extremity and additional studies of the left shows:  1. Bilateral superficial peroneal sensory responses are absent.  Bilateral sural sensory responses are within normal limits. 2. Bilateral tibial and peroneal (EDB) motor responses show reduced amplitude.  Bilateral peroneal motor responses at the tibialis anterior are within normal limits. 3. Bilateral tibial H reflex studies are mildly prolonged. 4. Chronic motor axonal loss changes are seen affecting nearly all the tested muscles of the right lower extremity, without accompanied active denervation.  Needle examination of the left lower extremity was not performed due to abrupt movements during testing.  Impression: 1. 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 2. Superimposed multilevel intraspinal canal lesions (i.e. radiculopathy, etc) affecting the L3-S1 nerve root/segments. 3. There is a generalized pattern of incomplete motor unit recruitment most likely due to central disorder of motor unit control, pain, and less likely poor affect.   ___________________________ Narda Amber, DO   Nerve Conduction Studies Anti Sensory Summary Table   Site NR Peak (ms) Norm Peak (ms) P-T Amp (V) Norm P-T Amp  Left Sup Peroneal Anti Sensory (Ant Lat Mall)  32.8C  12 cm NR  <4.6  >3  Right Sup Peroneal Anti Sensory (Ant Lat Mall)  32.8C  12 cm NR  <4.6  >3  Left Sural Anti Sensory (Lat Mall)   32.8C  Calf    2.9 <4.6 6.7 >3  Right Sural Anti Sensory (Lat Mall)  32.8C  Calf    3.5 <4.6 4.5 >3   Motor Summary Table   Site NR Onset (ms) Norm Onset (ms) O-P Amp (mV) Norm O-P Amp Site1 Site2 Delta-0 (ms) Dist (cm) Vel (m/s) Norm Vel (m/s)  Left Peroneal Motor (Ext Dig Brev)  32.8C  Ankle    4.5 <6.0 1.3 >2.5 B Fib Ankle 9.0 36.0 40 >40  B Fib    13.5  0.8  Poplt B Fib 2.0 8.0 40 >40  Poplt    15.5  0.9         Right Peroneal Motor (Ext Dig Brev)  32.8C  Ankle    5.9 <6.0 1.0 >2.5 B Fib Ankle 9.1 36.0 40 >40  B Fib    15.0  0.9  Poplt B Fib 2.0 8.0 40 >40  Poplt    17.0  1.0         Left Peroneal TA Motor (Tib Ant)  32.8C  Fib Head    3.4 <4.5 4.2 >3 Poplit Fib Head 1.2 8.0 67 >40  Poplit    4.6  4.1         Right Peroneal TA Motor (Tib Ant)  32.8C  Fib Head    3.4 <4.5 3.3 >3 Poplit Fib Head 1.2 8.0 67 >40  Poplit    4.6  3.3         Left Tibial Motor (Abd Hall Brev)  32.8C  Ankle    4.2 <6.0 3.0 >4 Knee Ankle 10.6 42.0 40 >40  Knee    14.8  1.4  Right Tibial Motor (Abd Hall Brev)  32.8C  Ankle    5.0 <6.0 1.0 >4 Knee Ankle 9.1 40.0 44 >40  Knee    14.1  1.0          H Reflex Studies   NR H-Lat (ms) Lat Norm (ms) L-R H-Lat (ms)  Left Tibial (Gastroc)  32.8C     36.05 <35 0.00  Right Tibial (Gastroc)  32.8C     36.05 <35 0.00   EMG   Side Muscle Ins Act Fibs Psw Fasc Number Recrt Dur Dur. Amp Amp. Poly Poly. Comment  Right AntTibialis Nml Nml Nml Nml 2- Rapid Some 1+ Some 1+ Nml Nml N/A  Right Gastroc Nml Nml Nml Nml 1- Rapid Some 1+ Some 1+ Nml Nml N/A  Right Flex Dig Long Nml Nml Nml Nml 2- Rapid Some 1+ Some 1+ Nml Nml N/A  Right RectFemoris Nml Nml Nml Nml 1- Rapid Some 1+ Some 1+ Nml Nml N/A  Right GluteusMed Nml Nml Nml Nml 1- Rapid Some 1+ Some 1+ Nml Nml N/A  Right BicepsFemS Nml Nml Nml Nml 1- Rapid Some 1+ Some 1+ Nml Nml N/A      Waveforms:

## 2018-10-19 NOTE — Telephone Encounter (Signed)
Patient's wife called back today. She said after his MRI he was at home and was noticing the muscles in his  face twitching and moving. It will last about a day. The nurse who comes to his house advised her to call the office. Please Call. Thanks

## 2018-10-20 NOTE — Telephone Encounter (Signed)
Spoke with pt's wife relaying MRI results.  She states that she was already aware of everything I relayed as it is the same as his previous MRI.  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  Pt's wife goes on to state that roughly 6x/year pt will experience facial twitching that lasts one day.  States pt told her the twitching is painful.  I advised that I would send message to Dr. Delice Lesch and return call with any recommendations.  Wife states "We have already looked online and don't know if Dr. Delice Lesch could be of much help."

## 2018-10-26 DIAGNOSIS — I129 Hypertensive chronic kidney disease with stage 1 through stage 4 chronic kidney disease, or unspecified chronic kidney disease: Secondary | ICD-10-CM | POA: Diagnosis not present

## 2018-10-26 DIAGNOSIS — J449 Chronic obstructive pulmonary disease, unspecified: Secondary | ICD-10-CM | POA: Diagnosis not present

## 2018-10-26 DIAGNOSIS — R2681 Unsteadiness on feet: Secondary | ICD-10-CM | POA: Diagnosis not present

## 2018-10-26 DIAGNOSIS — F17210 Nicotine dependence, cigarettes, uncomplicated: Secondary | ICD-10-CM | POA: Diagnosis not present

## 2018-10-26 DIAGNOSIS — F0390 Unspecified dementia without behavioral disturbance: Secondary | ICD-10-CM | POA: Diagnosis not present

## 2018-10-26 DIAGNOSIS — N183 Chronic kidney disease, stage 3 (moderate): Secondary | ICD-10-CM | POA: Diagnosis not present

## 2018-11-03 DIAGNOSIS — N183 Chronic kidney disease, stage 3 (moderate): Secondary | ICD-10-CM | POA: Diagnosis not present

## 2018-11-03 DIAGNOSIS — J449 Chronic obstructive pulmonary disease, unspecified: Secondary | ICD-10-CM | POA: Diagnosis not present

## 2018-11-03 DIAGNOSIS — R2681 Unsteadiness on feet: Secondary | ICD-10-CM | POA: Diagnosis not present

## 2018-11-03 DIAGNOSIS — F0390 Unspecified dementia without behavioral disturbance: Secondary | ICD-10-CM | POA: Diagnosis not present

## 2018-11-03 DIAGNOSIS — F17210 Nicotine dependence, cigarettes, uncomplicated: Secondary | ICD-10-CM | POA: Diagnosis not present

## 2018-11-03 DIAGNOSIS — I129 Hypertensive chronic kidney disease with stage 1 through stage 4 chronic kidney disease, or unspecified chronic kidney disease: Secondary | ICD-10-CM | POA: Diagnosis not present

## 2018-11-07 DIAGNOSIS — M159 Polyosteoarthritis, unspecified: Secondary | ICD-10-CM | POA: Diagnosis not present

## 2018-11-07 DIAGNOSIS — G894 Chronic pain syndrome: Secondary | ICD-10-CM | POA: Diagnosis not present

## 2018-11-07 DIAGNOSIS — Z6822 Body mass index (BMI) 22.0-22.9, adult: Secondary | ICD-10-CM | POA: Diagnosis not present

## 2018-11-07 DIAGNOSIS — J449 Chronic obstructive pulmonary disease, unspecified: Secondary | ICD-10-CM | POA: Diagnosis not present

## 2018-11-07 DIAGNOSIS — Z1389 Encounter for screening for other disorder: Secondary | ICD-10-CM | POA: Diagnosis not present

## 2018-11-07 DIAGNOSIS — I10 Essential (primary) hypertension: Secondary | ICD-10-CM | POA: Diagnosis not present

## 2018-11-18 DIAGNOSIS — F0390 Unspecified dementia without behavioral disturbance: Secondary | ICD-10-CM | POA: Diagnosis not present

## 2018-11-18 DIAGNOSIS — I129 Hypertensive chronic kidney disease with stage 1 through stage 4 chronic kidney disease, or unspecified chronic kidney disease: Secondary | ICD-10-CM | POA: Diagnosis not present

## 2018-11-18 DIAGNOSIS — J449 Chronic obstructive pulmonary disease, unspecified: Secondary | ICD-10-CM | POA: Diagnosis not present

## 2018-11-18 DIAGNOSIS — F17210 Nicotine dependence, cigarettes, uncomplicated: Secondary | ICD-10-CM | POA: Diagnosis not present

## 2018-11-18 DIAGNOSIS — R2681 Unsteadiness on feet: Secondary | ICD-10-CM | POA: Diagnosis not present

## 2018-11-18 DIAGNOSIS — N183 Chronic kidney disease, stage 3 (moderate): Secondary | ICD-10-CM | POA: Diagnosis not present

## 2018-11-28 DIAGNOSIS — F0390 Unspecified dementia without behavioral disturbance: Secondary | ICD-10-CM | POA: Diagnosis not present

## 2018-11-28 DIAGNOSIS — J449 Chronic obstructive pulmonary disease, unspecified: Secondary | ICD-10-CM | POA: Diagnosis not present

## 2018-11-28 DIAGNOSIS — I129 Hypertensive chronic kidney disease with stage 1 through stage 4 chronic kidney disease, or unspecified chronic kidney disease: Secondary | ICD-10-CM | POA: Diagnosis not present

## 2018-11-28 DIAGNOSIS — R2681 Unsteadiness on feet: Secondary | ICD-10-CM | POA: Diagnosis not present

## 2018-11-28 DIAGNOSIS — N183 Chronic kidney disease, stage 3 (moderate): Secondary | ICD-10-CM | POA: Diagnosis not present

## 2018-11-28 DIAGNOSIS — F17210 Nicotine dependence, cigarettes, uncomplicated: Secondary | ICD-10-CM | POA: Diagnosis not present

## 2018-12-02 DIAGNOSIS — F17210 Nicotine dependence, cigarettes, uncomplicated: Secondary | ICD-10-CM | POA: Diagnosis not present

## 2018-12-02 DIAGNOSIS — R2681 Unsteadiness on feet: Secondary | ICD-10-CM | POA: Diagnosis not present

## 2018-12-02 DIAGNOSIS — N183 Chronic kidney disease, stage 3 (moderate): Secondary | ICD-10-CM | POA: Diagnosis not present

## 2018-12-02 DIAGNOSIS — J449 Chronic obstructive pulmonary disease, unspecified: Secondary | ICD-10-CM | POA: Diagnosis not present

## 2018-12-02 DIAGNOSIS — F0390 Unspecified dementia without behavioral disturbance: Secondary | ICD-10-CM | POA: Diagnosis not present

## 2018-12-02 DIAGNOSIS — I129 Hypertensive chronic kidney disease with stage 1 through stage 4 chronic kidney disease, or unspecified chronic kidney disease: Secondary | ICD-10-CM | POA: Diagnosis not present

## 2018-12-04 ENCOUNTER — Encounter (HOSPITAL_COMMUNITY): Payer: Self-pay | Admitting: *Deleted

## 2018-12-04 ENCOUNTER — Emergency Department (HOSPITAL_COMMUNITY)
Admission: EM | Admit: 2018-12-04 | Discharge: 2018-12-04 | Disposition: A | Discharge status: Home / Self Care | Payer: Medicare Other | Attending: Emergency Medicine | Admitting: Emergency Medicine

## 2018-12-04 ENCOUNTER — Other Ambulatory Visit: Payer: Self-pay

## 2018-12-04 DIAGNOSIS — F17210 Nicotine dependence, cigarettes, uncomplicated: Secondary | ICD-10-CM | POA: Insufficient documentation

## 2018-12-04 DIAGNOSIS — Z7982 Long term (current) use of aspirin: Secondary | ICD-10-CM | POA: Insufficient documentation

## 2018-12-04 DIAGNOSIS — J449 Chronic obstructive pulmonary disease, unspecified: Secondary | ICD-10-CM | POA: Insufficient documentation

## 2018-12-04 DIAGNOSIS — I10 Essential (primary) hypertension: Secondary | ICD-10-CM | POA: Diagnosis not present

## 2018-12-04 DIAGNOSIS — L723 Sebaceous cyst: Secondary | ICD-10-CM

## 2018-12-04 DIAGNOSIS — R222 Localized swelling, mass and lump, trunk: Secondary | ICD-10-CM | POA: Diagnosis present

## 2018-12-04 DIAGNOSIS — Z79899 Other long term (current) drug therapy: Secondary | ICD-10-CM | POA: Insufficient documentation

## 2018-12-04 MED ORDER — SULFAMETHOXAZOLE-TRIMETHOPRIM 800-160 MG PO TABS: 1.0000 | ORAL_TABLET | Freq: Two times a day (BID) | ORAL | 0 refills | Status: AC

## 2018-12-04 MED ORDER — LIDOCAINE HCL (PF) 1 % IJ SOLN
INTRAMUSCULAR | Status: AC
  Administered 2018-12-04: 10 mL
  Filled 2018-12-04: qty 10

## 2018-12-04 MED ORDER — SULFAMETHOXAZOLE-TRIMETHOPRIM 800-160 MG PO TABS: 1.0000 | ORAL_TABLET | Freq: Two times a day (BID) | ORAL | 0 refills | Status: DC

## 2018-12-04 NOTE — ED Triage Notes (Signed)
Patient has a 'boil" on middle back that is recurring for "years".  Today increased pain with drainage.

## 2018-12-04 NOTE — ED Provider Notes (Signed)
[facility] EMERGENCY DEPARTMENT Provider Note   CSN: [id number] Arrival date & time: 12/04/18  1539     History   Chief Complaint Chief Complaint  Patient presents with  . Abscess    HPI Glenn [name] is a 77 y.o. male.  The history is provided by the patient. No language interpreter was used.  Abscess  Location:  Torso Size:  8 Abscess quality: painful, redness and warmth   Red streaking: no   Progression:  Worsening Pain details:    Severity:  Moderate   Timing:  Constant   Progression:  Worsening Relieved by:  Nothing Worsened by:  Nothing Ineffective treatments:  None tried Pt reports he has had an abscess in same area in the past  Past Medical History:  Diagnosis Date  . Back pain   . COPD (chronic obstructive pulmonary disease) ([place])   . Dementia ([place])   . GERD (gastroesophageal reflux disease)   . HTN (hypertension)   . Hyperlipidemia   . Hypogonadism in male   . Memory changes   . Stroke [facility])    2001 no residual symptom, 2013 no residual symptom  . Tobacco use     Patient Active Problem List   Diagnosis Date Noted  . Vascular dementia with behavioral disturbance ([place]) 08/12/2017  . Excessive daytime sleepiness 08/09/2015  . CVA (cerebral vascular accident) ([place]) 08/09/2015  . COPD exacerbation ([place]) 06/10/2015  . Hemiparesis affecting right side as late effect of cerebrovascular accident ([place]) 06/10/2015  . Opioid dependence with opioid-induced sleep disorder ([place]) 06/10/2015  . Impairment of visual perception 12/19/2013  . Lack of coordination 12/19/2013  . Muscle weakness (generalized) 12/19/2013  . Aphasia following cerebrovascular disease 12/11/2013  . Acute ischemic stroke ([place]) 11/03/2013  . Acute renal failure ([place]) 11/03/2013  . COPD (chronic obstructive pulmonary disease) ([place]) 08/06/2012  . Tobacco abuse 08/06/2012  . Memory difficulties 08/06/2012  . Hyperlipidemia 08/06/2012  . CVA (cerebral infarction) 08/05/2012  . HTN  (hypertension) 08/05/2012  . CONSTIPATION 05/14/2009  . CHANGE IN BOWELS 05/14/2009  . COLONIC POLYPS, HX OF 05/14/2009  . SHOULDER, ARTHRITIS, DEGEN./OSTEO 03/14/2008  . SHOULDER PAIN 03/14/2008  . IMPINGEMENT SYNDROME 03/14/2008  . RUPTURE ROTATOR CUFF 03/14/2008    Past Surgical History:  Procedure Laterality Date  . BACK SURGERY     4 surgeries  . BACK SURGERY    . CARPAL TUNNEL RELEASE Bilateral    2 surger  . stroke          Home Medications    Prior to Admission medications   Medication Sig Start Date End Date Taking? Authorizing Provider  amLODipine (NORVASC) 10 MG tablet Take 10 mg by mouth daily.    [provider]  aspirin 325 MG EC tablet Take 1 tablet (325 mg total) by mouth daily. 11/04/13   Janece Canterbury, MD  atorvastatin (LIPITOR) 20 MG tablet Take 1 tablet (20 mg total) by mouth daily at 6 PM. Patient taking differently: Take 20 mg by mouth daily.  11/04/13   Janece Canterbury, MD  azithromycin (ZITHROMAX) 250 MG tablet Take 1 tablet (250 mg total) by mouth daily. Take first 2 tablets together, then 1 every day until finished. 08/16/18   Nat Christen, MD  buPROPion (WELLBUTRIN) 100 MG tablet Take 100 mg by mouth 2 (two) times daily.     [provider]  cholecalciferol (VITAMIN D) 1000 units tablet Take 2,000 Units by mouth every morning.     [provider]  clopidogrel (  PLAVIX) 75 MG tablet Take 1 tablet (75 mg total) by mouth daily with breakfast. 11/04/13   Janece Canterbury, MD  donepezil (ARICEPT) 10 MG tablet Take 10 mg by mouth at bedtime.  08/04/18   [provider]  memantine (NAMENDA) 10 MG tablet  08/26/18   [provider]  memantine (NAMENDA) 5 MG tablet Take 5 mg by mouth 2 (two) times daily.  08/08/18   [provider]  mirtazapine (REMERON) 15 MG tablet Take 15 mg by mouth at bedtime. 07/28/18   [provider]  Multiple Vitamin (MULTIVITAMIN) tablet Take 1 tablet by mouth daily.    [provider]  omeprazole (PRILOSEC) 40 MG capsule Take 40 mg by mouth daily.     [provider]  Oxycodone HCl 20 MG TABS Take 20 mg by mouth 2 (two) times daily.     [provider]  QUEtiapine (SEROQUEL) 100 MG tablet Take 100 mg by mouth at bedtime.    [provider]  sulfamethoxazole-trimethoprim (BACTRIM DS,SEPTRA DS) 800-160 MG tablet Take 1 tablet by mouth 2 (two) times daily for 7 days. 12/04/18 12/11/18  Glenn Meadow, PA-C  VITAMIN E PO Take 1 tablet by mouth every morning.    [provider]  hydrochlorothiazide (HYDRODIURIL) 25 MG tablet Take 25 mg by mouth daily.    03/08/12  [provider]  lisinopril (PRINIVIL,ZESTRIL) 40 MG tablet Take 40 mg by mouth daily. 1/2 tab   03/08/12  [provider]  simvastatin (ZOCOR) 80 MG tablet Take 80 mg by mouth at bedtime. 1/2 tab daily   03/08/12  [provider]    Family History Family History  Problem Relation Age of Onset  . Other Father   . High blood pressure Maternal Grandmother   . High blood pressure Maternal Grandfather   . Stroke Maternal Aunt   . Stroke Maternal Uncle   . Diabetes Neg Hx   . Cancer Neg Hx     Social History Social History   Tobacco Use  . Smoking status: Current Every Day Smoker    Packs/day: 1.50    Years: 50.00    Pack years: 75.00    Types: Cigarettes  . Smokeless tobacco: Never Used  Substance Use Topics  . Alcohol use: No  . Drug use: No    Types: Oxycodone     Allergies   Patient has no known allergies.   Review of Systems Review of Systems  All other systems reviewed and are negative.    Physical Exam Updated Vital Signs BP (!) 174/74 (BP Location: Right Arm)   Pulse 60   Temp 97.7 F (36.5 C) (Oral)   Resp 14   Ht 5\' 8"  (1.727 m)   Wt 66 kg   SpO2 97%   BMI 22.11 kg/m   Physical Exam  Constitutional: He appears well-developed and well-nourished.  HENT:  Head: Normocephalic.  Eyes: Pupils are equal,  round, and reactive to light.  Neck: Normal range of motion.  Cardiovascular: Normal rate.  Pulmonary/Chest: Effort normal.  Abdominal: Soft.  Musculoskeletal: Normal range of motion.  Neurological: He is alert.  Skin: Skin is warm.  Nursing note and vitals reviewed.    ED Treatments / Results  Labs (all labs ordered are listed, but only abnormal results are displayed) Labs Reviewed - No data to display  EKG None  Radiology No results found.  Procedures .Marland KitchenIncision and Drainage Date/Time: 12/04/2018 8:55 PM Performed by: Glenn Meadow, PA-C  Authorized by: Glenn Meadow, PA-C   Consent:    Consent obtained:  Verbal   Consent given by:  Patient   Risks discussed:  Bleeding Location:    Type:  Cyst   Size:  8   Location:  Trunk Pre-procedure details:    Skin preparation:  Chloraprep Anesthesia (see MAR for exact dosages):    Anesthesia method:  Local infiltration   Local anesthetic:  Lidocaine 1% w/o epi Procedure type:    Complexity:  Simple Procedure details:    Needle aspiration: no     Incision types:  Single straight   Scalpel blade:  11   Wound management:  Probed and deloculated   Drainage amount:  Moderate   Wound treatment:  Wound left open Post-procedure details:    Patient tolerance of procedure:  Tolerated well, no immediate complications Comments:     Large amount of cystic material, cystic capsule and purulent drainage   (including critical care time)  Medications Ordered in ED Medications  lidocaine (PF) (XYLOCAINE) 1 % injection (10 mLs  Given 12/04/18 1716)     Initial Impression / Assessment and Plan / ED Course  I have reviewed the triage vital signs and the nursing notes.  Pertinent labs & imaging results that were available during my care of the patient were reviewed by me and considered in my medical decision making (see chart for details).       Final Clinical Impressions(s) / ED Diagnoses   Final diagnoses:  Sebaceous  cyst    ED Discharge Orders         Ordered    sulfamethoxazole-trimethoprim (BACTRIM DS,SEPTRA DS) 800-160 MG tablet  2 times daily,   Status:  Discontinued     12/04/18 1717    sulfamethoxazole-trimethoprim (BACTRIM DS,SEPTRA DS) 800-160 MG tablet  2 times daily     12/04/18 1718        An After Visit Summary was printed and given to the patient.   Glenn [name], Hershal Coria 12/04/18 2057    Fredia Sorrow, MD 12/05/18 1530

## 2018-12-06 DIAGNOSIS — F17210 Nicotine dependence, cigarettes, uncomplicated: Secondary | ICD-10-CM | POA: Diagnosis not present

## 2018-12-06 DIAGNOSIS — F0390 Unspecified dementia without behavioral disturbance: Secondary | ICD-10-CM | POA: Diagnosis not present

## 2018-12-06 DIAGNOSIS — M5410 Radiculopathy, site unspecified: Secondary | ICD-10-CM | POA: Diagnosis not present

## 2018-12-06 DIAGNOSIS — I69351 Hemiplegia and hemiparesis following cerebral infarction affecting right dominant side: Secondary | ICD-10-CM | POA: Diagnosis not present

## 2018-12-06 DIAGNOSIS — N183 Chronic kidney disease, stage 3 (moderate): Secondary | ICD-10-CM | POA: Diagnosis not present

## 2018-12-06 DIAGNOSIS — M545 Low back pain: Secondary | ICD-10-CM | POA: Diagnosis not present

## 2018-12-06 DIAGNOSIS — F419 Anxiety disorder, unspecified: Secondary | ICD-10-CM | POA: Diagnosis not present

## 2018-12-06 DIAGNOSIS — M1990 Unspecified osteoarthritis, unspecified site: Secondary | ICD-10-CM | POA: Diagnosis not present

## 2018-12-06 DIAGNOSIS — J449 Chronic obstructive pulmonary disease, unspecified: Secondary | ICD-10-CM | POA: Diagnosis not present

## 2018-12-06 DIAGNOSIS — I129 Hypertensive chronic kidney disease with stage 1 through stage 4 chronic kidney disease, or unspecified chronic kidney disease: Secondary | ICD-10-CM | POA: Diagnosis not present

## 2018-12-06 DIAGNOSIS — K219 Gastro-esophageal reflux disease without esophagitis: Secondary | ICD-10-CM | POA: Diagnosis not present

## 2018-12-06 DIAGNOSIS — G8929 Other chronic pain: Secondary | ICD-10-CM | POA: Diagnosis not present

## 2018-12-06 DIAGNOSIS — F329 Major depressive disorder, single episode, unspecified: Secondary | ICD-10-CM | POA: Diagnosis not present

## 2018-12-09 DIAGNOSIS — N183 Chronic kidney disease, stage 3 (moderate): Secondary | ICD-10-CM | POA: Diagnosis not present

## 2018-12-09 DIAGNOSIS — G8929 Other chronic pain: Secondary | ICD-10-CM | POA: Diagnosis not present

## 2018-12-09 DIAGNOSIS — I129 Hypertensive chronic kidney disease with stage 1 through stage 4 chronic kidney disease, or unspecified chronic kidney disease: Secondary | ICD-10-CM | POA: Diagnosis not present

## 2018-12-09 DIAGNOSIS — I69351 Hemiplegia and hemiparesis following cerebral infarction affecting right dominant side: Secondary | ICD-10-CM | POA: Diagnosis not present

## 2018-12-09 DIAGNOSIS — J449 Chronic obstructive pulmonary disease, unspecified: Secondary | ICD-10-CM | POA: Diagnosis not present

## 2018-12-09 DIAGNOSIS — F0390 Unspecified dementia without behavioral disturbance: Secondary | ICD-10-CM | POA: Diagnosis not present

## 2018-12-13 DIAGNOSIS — Z6822 Body mass index (BMI) 22.0-22.9, adult: Secondary | ICD-10-CM | POA: Diagnosis not present

## 2018-12-13 DIAGNOSIS — J449 Chronic obstructive pulmonary disease, unspecified: Secondary | ICD-10-CM | POA: Diagnosis not present

## 2018-12-13 DIAGNOSIS — R269 Unspecified abnormalities of gait and mobility: Secondary | ICD-10-CM | POA: Diagnosis not present

## 2018-12-13 DIAGNOSIS — F0390 Unspecified dementia without behavioral disturbance: Secondary | ICD-10-CM | POA: Diagnosis not present

## 2018-12-16 DIAGNOSIS — N183 Chronic kidney disease, stage 3 (moderate): Secondary | ICD-10-CM | POA: Diagnosis not present

## 2018-12-16 DIAGNOSIS — F0390 Unspecified dementia without behavioral disturbance: Secondary | ICD-10-CM | POA: Diagnosis not present

## 2018-12-16 DIAGNOSIS — I69351 Hemiplegia and hemiparesis following cerebral infarction affecting right dominant side: Secondary | ICD-10-CM | POA: Diagnosis not present

## 2018-12-16 DIAGNOSIS — I129 Hypertensive chronic kidney disease with stage 1 through stage 4 chronic kidney disease, or unspecified chronic kidney disease: Secondary | ICD-10-CM | POA: Diagnosis not present

## 2019-01-18 DIAGNOSIS — I69351 Hemiplegia and hemiparesis following cerebral infarction affecting right dominant side: Secondary | ICD-10-CM | POA: Diagnosis not present

## 2019-01-18 DIAGNOSIS — I129 Hypertensive chronic kidney disease with stage 1 through stage 4 chronic kidney disease, or unspecified chronic kidney disease: Secondary | ICD-10-CM | POA: Diagnosis not present

## 2019-01-18 DIAGNOSIS — G8929 Other chronic pain: Secondary | ICD-10-CM | POA: Diagnosis not present

## 2019-01-18 DIAGNOSIS — J449 Chronic obstructive pulmonary disease, unspecified: Secondary | ICD-10-CM | POA: Diagnosis not present

## 2019-01-18 DIAGNOSIS — N183 Chronic kidney disease, stage 3 (moderate): Secondary | ICD-10-CM | POA: Diagnosis not present

## 2019-01-18 DIAGNOSIS — F0390 Unspecified dementia without behavioral disturbance: Secondary | ICD-10-CM | POA: Diagnosis not present

## 2019-01-20 DIAGNOSIS — F0390 Unspecified dementia without behavioral disturbance: Secondary | ICD-10-CM | POA: Diagnosis not present

## 2019-01-20 DIAGNOSIS — I69351 Hemiplegia and hemiparesis following cerebral infarction affecting right dominant side: Secondary | ICD-10-CM | POA: Diagnosis not present

## 2019-01-20 DIAGNOSIS — J449 Chronic obstructive pulmonary disease, unspecified: Secondary | ICD-10-CM | POA: Diagnosis not present

## 2019-01-20 DIAGNOSIS — G8929 Other chronic pain: Secondary | ICD-10-CM | POA: Diagnosis not present

## 2019-01-20 DIAGNOSIS — I129 Hypertensive chronic kidney disease with stage 1 through stage 4 chronic kidney disease, or unspecified chronic kidney disease: Secondary | ICD-10-CM | POA: Diagnosis not present

## 2019-01-20 DIAGNOSIS — N183 Chronic kidney disease, stage 3 (moderate): Secondary | ICD-10-CM | POA: Diagnosis not present

## 2019-01-23 DIAGNOSIS — I69351 Hemiplegia and hemiparesis following cerebral infarction affecting right dominant side: Secondary | ICD-10-CM | POA: Diagnosis not present

## 2019-01-23 DIAGNOSIS — F0390 Unspecified dementia without behavioral disturbance: Secondary | ICD-10-CM | POA: Diagnosis not present

## 2019-01-23 DIAGNOSIS — G8929 Other chronic pain: Secondary | ICD-10-CM | POA: Diagnosis not present

## 2019-01-23 DIAGNOSIS — J449 Chronic obstructive pulmonary disease, unspecified: Secondary | ICD-10-CM | POA: Diagnosis not present

## 2019-01-23 DIAGNOSIS — I129 Hypertensive chronic kidney disease with stage 1 through stage 4 chronic kidney disease, or unspecified chronic kidney disease: Secondary | ICD-10-CM | POA: Diagnosis not present

## 2019-01-23 DIAGNOSIS — N183 Chronic kidney disease, stage 3 (moderate): Secondary | ICD-10-CM | POA: Diagnosis not present

## 2019-01-24 DIAGNOSIS — I129 Hypertensive chronic kidney disease with stage 1 through stage 4 chronic kidney disease, or unspecified chronic kidney disease: Secondary | ICD-10-CM | POA: Diagnosis not present

## 2019-01-24 DIAGNOSIS — F0390 Unspecified dementia without behavioral disturbance: Secondary | ICD-10-CM | POA: Diagnosis not present

## 2019-01-24 DIAGNOSIS — I69351 Hemiplegia and hemiparesis following cerebral infarction affecting right dominant side: Secondary | ICD-10-CM | POA: Diagnosis not present

## 2019-01-24 DIAGNOSIS — N183 Chronic kidney disease, stage 3 (moderate): Secondary | ICD-10-CM | POA: Diagnosis not present

## 2019-01-24 DIAGNOSIS — J449 Chronic obstructive pulmonary disease, unspecified: Secondary | ICD-10-CM | POA: Diagnosis not present

## 2019-01-24 DIAGNOSIS — G8929 Other chronic pain: Secondary | ICD-10-CM | POA: Diagnosis not present

## 2019-01-26 DIAGNOSIS — I129 Hypertensive chronic kidney disease with stage 1 through stage 4 chronic kidney disease, or unspecified chronic kidney disease: Secondary | ICD-10-CM | POA: Diagnosis not present

## 2019-01-26 DIAGNOSIS — G8929 Other chronic pain: Secondary | ICD-10-CM | POA: Diagnosis not present

## 2019-01-26 DIAGNOSIS — I69351 Hemiplegia and hemiparesis following cerebral infarction affecting right dominant side: Secondary | ICD-10-CM | POA: Diagnosis not present

## 2019-01-26 DIAGNOSIS — J449 Chronic obstructive pulmonary disease, unspecified: Secondary | ICD-10-CM | POA: Diagnosis not present

## 2019-01-26 DIAGNOSIS — N183 Chronic kidney disease, stage 3 (moderate): Secondary | ICD-10-CM | POA: Diagnosis not present

## 2019-01-26 DIAGNOSIS — F0390 Unspecified dementia without behavioral disturbance: Secondary | ICD-10-CM | POA: Diagnosis not present

## 2019-01-30 DIAGNOSIS — I129 Hypertensive chronic kidney disease with stage 1 through stage 4 chronic kidney disease, or unspecified chronic kidney disease: Secondary | ICD-10-CM | POA: Diagnosis not present

## 2019-01-30 DIAGNOSIS — F0390 Unspecified dementia without behavioral disturbance: Secondary | ICD-10-CM | POA: Diagnosis not present

## 2019-01-30 DIAGNOSIS — J449 Chronic obstructive pulmonary disease, unspecified: Secondary | ICD-10-CM | POA: Diagnosis not present

## 2019-01-30 DIAGNOSIS — G8929 Other chronic pain: Secondary | ICD-10-CM | POA: Diagnosis not present

## 2019-01-30 DIAGNOSIS — I69351 Hemiplegia and hemiparesis following cerebral infarction affecting right dominant side: Secondary | ICD-10-CM | POA: Diagnosis not present

## 2019-01-30 DIAGNOSIS — N183 Chronic kidney disease, stage 3 (moderate): Secondary | ICD-10-CM | POA: Diagnosis not present

## 2019-01-31 DIAGNOSIS — G8929 Other chronic pain: Secondary | ICD-10-CM | POA: Diagnosis not present

## 2019-01-31 DIAGNOSIS — J449 Chronic obstructive pulmonary disease, unspecified: Secondary | ICD-10-CM | POA: Diagnosis not present

## 2019-01-31 DIAGNOSIS — N183 Chronic kidney disease, stage 3 (moderate): Secondary | ICD-10-CM | POA: Diagnosis not present

## 2019-01-31 DIAGNOSIS — F0390 Unspecified dementia without behavioral disturbance: Secondary | ICD-10-CM | POA: Diagnosis not present

## 2019-01-31 DIAGNOSIS — I129 Hypertensive chronic kidney disease with stage 1 through stage 4 chronic kidney disease, or unspecified chronic kidney disease: Secondary | ICD-10-CM | POA: Diagnosis not present

## 2019-01-31 DIAGNOSIS — I69351 Hemiplegia and hemiparesis following cerebral infarction affecting right dominant side: Secondary | ICD-10-CM | POA: Diagnosis not present

## 2019-02-02 DIAGNOSIS — J449 Chronic obstructive pulmonary disease, unspecified: Secondary | ICD-10-CM | POA: Diagnosis not present

## 2019-02-02 DIAGNOSIS — G8929 Other chronic pain: Secondary | ICD-10-CM | POA: Diagnosis not present

## 2019-02-02 DIAGNOSIS — I69351 Hemiplegia and hemiparesis following cerebral infarction affecting right dominant side: Secondary | ICD-10-CM | POA: Diagnosis not present

## 2019-02-02 DIAGNOSIS — F0390 Unspecified dementia without behavioral disturbance: Secondary | ICD-10-CM | POA: Diagnosis not present

## 2019-02-02 DIAGNOSIS — I129 Hypertensive chronic kidney disease with stage 1 through stage 4 chronic kidney disease, or unspecified chronic kidney disease: Secondary | ICD-10-CM | POA: Diagnosis not present

## 2019-02-02 DIAGNOSIS — N183 Chronic kidney disease, stage 3 (moderate): Secondary | ICD-10-CM | POA: Diagnosis not present

## 2019-02-04 DIAGNOSIS — M545 Low back pain: Secondary | ICD-10-CM | POA: Diagnosis not present

## 2019-02-04 DIAGNOSIS — F0390 Unspecified dementia without behavioral disturbance: Secondary | ICD-10-CM | POA: Diagnosis not present

## 2019-02-04 DIAGNOSIS — Z7982 Long term (current) use of aspirin: Secondary | ICD-10-CM | POA: Diagnosis not present

## 2019-02-04 DIAGNOSIS — I69320 Aphasia following cerebral infarction: Secondary | ICD-10-CM | POA: Diagnosis not present

## 2019-02-04 DIAGNOSIS — E875 Hyperkalemia: Secondary | ICD-10-CM | POA: Diagnosis not present

## 2019-02-04 DIAGNOSIS — N184 Chronic kidney disease, stage 4 (severe): Secondary | ICD-10-CM | POA: Diagnosis not present

## 2019-02-04 DIAGNOSIS — I129 Hypertensive chronic kidney disease with stage 1 through stage 4 chronic kidney disease, or unspecified chronic kidney disease: Secondary | ICD-10-CM | POA: Diagnosis not present

## 2019-02-04 DIAGNOSIS — Z9181 History of falling: Secondary | ICD-10-CM | POA: Diagnosis not present

## 2019-02-04 DIAGNOSIS — K219 Gastro-esophageal reflux disease without esophagitis: Secondary | ICD-10-CM | POA: Diagnosis not present

## 2019-02-04 DIAGNOSIS — F17210 Nicotine dependence, cigarettes, uncomplicated: Secondary | ICD-10-CM | POA: Diagnosis not present

## 2019-02-04 DIAGNOSIS — Z7902 Long term (current) use of antithrombotics/antiplatelets: Secondary | ICD-10-CM | POA: Diagnosis not present

## 2019-02-04 DIAGNOSIS — J449 Chronic obstructive pulmonary disease, unspecified: Secondary | ICD-10-CM | POA: Diagnosis not present

## 2019-02-04 DIAGNOSIS — M1990 Unspecified osteoarthritis, unspecified site: Secondary | ICD-10-CM | POA: Diagnosis not present

## 2019-02-04 DIAGNOSIS — W19XXXD Unspecified fall, subsequent encounter: Secondary | ICD-10-CM | POA: Diagnosis not present

## 2019-02-04 DIAGNOSIS — M5410 Radiculopathy, site unspecified: Secondary | ICD-10-CM | POA: Diagnosis not present

## 2019-02-04 DIAGNOSIS — F329 Major depressive disorder, single episode, unspecified: Secondary | ICD-10-CM | POA: Diagnosis not present

## 2019-02-04 DIAGNOSIS — G8929 Other chronic pain: Secondary | ICD-10-CM | POA: Diagnosis not present

## 2019-02-04 DIAGNOSIS — I69351 Hemiplegia and hemiparesis following cerebral infarction affecting right dominant side: Secondary | ICD-10-CM | POA: Diagnosis not present

## 2019-02-04 DIAGNOSIS — F419 Anxiety disorder, unspecified: Secondary | ICD-10-CM | POA: Diagnosis not present

## 2019-02-07 DIAGNOSIS — N184 Chronic kidney disease, stage 4 (severe): Secondary | ICD-10-CM | POA: Diagnosis not present

## 2019-02-07 DIAGNOSIS — I69320 Aphasia following cerebral infarction: Secondary | ICD-10-CM | POA: Diagnosis not present

## 2019-02-07 DIAGNOSIS — F0390 Unspecified dementia without behavioral disturbance: Secondary | ICD-10-CM | POA: Diagnosis not present

## 2019-02-07 DIAGNOSIS — J449 Chronic obstructive pulmonary disease, unspecified: Secondary | ICD-10-CM | POA: Diagnosis not present

## 2019-02-07 DIAGNOSIS — I129 Hypertensive chronic kidney disease with stage 1 through stage 4 chronic kidney disease, or unspecified chronic kidney disease: Secondary | ICD-10-CM | POA: Diagnosis not present

## 2019-02-07 DIAGNOSIS — I69351 Hemiplegia and hemiparesis following cerebral infarction affecting right dominant side: Secondary | ICD-10-CM | POA: Diagnosis not present

## 2019-02-09 DIAGNOSIS — I69320 Aphasia following cerebral infarction: Secondary | ICD-10-CM | POA: Diagnosis not present

## 2019-02-09 DIAGNOSIS — F0390 Unspecified dementia without behavioral disturbance: Secondary | ICD-10-CM | POA: Diagnosis not present

## 2019-02-09 DIAGNOSIS — I69351 Hemiplegia and hemiparesis following cerebral infarction affecting right dominant side: Secondary | ICD-10-CM | POA: Diagnosis not present

## 2019-02-09 DIAGNOSIS — N184 Chronic kidney disease, stage 4 (severe): Secondary | ICD-10-CM | POA: Diagnosis not present

## 2019-02-09 DIAGNOSIS — J449 Chronic obstructive pulmonary disease, unspecified: Secondary | ICD-10-CM | POA: Diagnosis not present

## 2019-02-09 DIAGNOSIS — I129 Hypertensive chronic kidney disease with stage 1 through stage 4 chronic kidney disease, or unspecified chronic kidney disease: Secondary | ICD-10-CM | POA: Diagnosis not present

## 2019-02-10 DIAGNOSIS — J449 Chronic obstructive pulmonary disease, unspecified: Secondary | ICD-10-CM | POA: Diagnosis not present

## 2019-02-10 DIAGNOSIS — N184 Chronic kidney disease, stage 4 (severe): Secondary | ICD-10-CM | POA: Diagnosis not present

## 2019-02-10 DIAGNOSIS — F0390 Unspecified dementia without behavioral disturbance: Secondary | ICD-10-CM | POA: Diagnosis not present

## 2019-02-10 DIAGNOSIS — I69351 Hemiplegia and hemiparesis following cerebral infarction affecting right dominant side: Secondary | ICD-10-CM | POA: Diagnosis not present

## 2019-02-10 DIAGNOSIS — I129 Hypertensive chronic kidney disease with stage 1 through stage 4 chronic kidney disease, or unspecified chronic kidney disease: Secondary | ICD-10-CM | POA: Diagnosis not present

## 2019-02-10 DIAGNOSIS — I69320 Aphasia following cerebral infarction: Secondary | ICD-10-CM | POA: Diagnosis not present

## 2019-02-13 DIAGNOSIS — I69320 Aphasia following cerebral infarction: Secondary | ICD-10-CM | POA: Diagnosis not present

## 2019-02-13 DIAGNOSIS — F0390 Unspecified dementia without behavioral disturbance: Secondary | ICD-10-CM | POA: Diagnosis not present

## 2019-02-13 DIAGNOSIS — N184 Chronic kidney disease, stage 4 (severe): Secondary | ICD-10-CM | POA: Diagnosis not present

## 2019-02-13 DIAGNOSIS — J449 Chronic obstructive pulmonary disease, unspecified: Secondary | ICD-10-CM | POA: Diagnosis not present

## 2019-02-13 DIAGNOSIS — I129 Hypertensive chronic kidney disease with stage 1 through stage 4 chronic kidney disease, or unspecified chronic kidney disease: Secondary | ICD-10-CM | POA: Diagnosis not present

## 2019-02-13 DIAGNOSIS — I69351 Hemiplegia and hemiparesis following cerebral infarction affecting right dominant side: Secondary | ICD-10-CM | POA: Diagnosis not present

## 2019-02-14 DIAGNOSIS — I129 Hypertensive chronic kidney disease with stage 1 through stage 4 chronic kidney disease, or unspecified chronic kidney disease: Secondary | ICD-10-CM | POA: Diagnosis not present

## 2019-02-14 DIAGNOSIS — I69351 Hemiplegia and hemiparesis following cerebral infarction affecting right dominant side: Secondary | ICD-10-CM | POA: Diagnosis not present

## 2019-02-14 DIAGNOSIS — F0390 Unspecified dementia without behavioral disturbance: Secondary | ICD-10-CM | POA: Diagnosis not present

## 2019-02-14 DIAGNOSIS — J449 Chronic obstructive pulmonary disease, unspecified: Secondary | ICD-10-CM | POA: Diagnosis not present

## 2019-02-14 DIAGNOSIS — I69320 Aphasia following cerebral infarction: Secondary | ICD-10-CM | POA: Diagnosis not present

## 2019-02-14 DIAGNOSIS — N184 Chronic kidney disease, stage 4 (severe): Secondary | ICD-10-CM | POA: Diagnosis not present

## 2019-02-15 DIAGNOSIS — F0390 Unspecified dementia without behavioral disturbance: Secondary | ICD-10-CM | POA: Diagnosis not present

## 2019-02-15 DIAGNOSIS — R2681 Unsteadiness on feet: Secondary | ICD-10-CM | POA: Diagnosis not present

## 2019-02-15 DIAGNOSIS — J449 Chronic obstructive pulmonary disease, unspecified: Secondary | ICD-10-CM | POA: Diagnosis not present

## 2019-02-15 DIAGNOSIS — I129 Hypertensive chronic kidney disease with stage 1 through stage 4 chronic kidney disease, or unspecified chronic kidney disease: Secondary | ICD-10-CM | POA: Diagnosis not present

## 2019-02-16 DIAGNOSIS — F0390 Unspecified dementia without behavioral disturbance: Secondary | ICD-10-CM | POA: Diagnosis not present

## 2019-02-16 DIAGNOSIS — I69351 Hemiplegia and hemiparesis following cerebral infarction affecting right dominant side: Secondary | ICD-10-CM | POA: Diagnosis not present

## 2019-02-16 DIAGNOSIS — I69320 Aphasia following cerebral infarction: Secondary | ICD-10-CM | POA: Diagnosis not present

## 2019-02-16 DIAGNOSIS — I129 Hypertensive chronic kidney disease with stage 1 through stage 4 chronic kidney disease, or unspecified chronic kidney disease: Secondary | ICD-10-CM | POA: Diagnosis not present

## 2019-02-16 DIAGNOSIS — N184 Chronic kidney disease, stage 4 (severe): Secondary | ICD-10-CM | POA: Diagnosis not present

## 2019-02-16 DIAGNOSIS — J449 Chronic obstructive pulmonary disease, unspecified: Secondary | ICD-10-CM | POA: Diagnosis not present

## 2019-02-17 DIAGNOSIS — J449 Chronic obstructive pulmonary disease, unspecified: Secondary | ICD-10-CM | POA: Diagnosis not present

## 2019-02-17 DIAGNOSIS — I69320 Aphasia following cerebral infarction: Secondary | ICD-10-CM | POA: Diagnosis not present

## 2019-02-17 DIAGNOSIS — N184 Chronic kidney disease, stage 4 (severe): Secondary | ICD-10-CM | POA: Diagnosis not present

## 2019-02-17 DIAGNOSIS — I69351 Hemiplegia and hemiparesis following cerebral infarction affecting right dominant side: Secondary | ICD-10-CM | POA: Diagnosis not present

## 2019-02-17 DIAGNOSIS — F0390 Unspecified dementia without behavioral disturbance: Secondary | ICD-10-CM | POA: Diagnosis not present

## 2019-02-17 DIAGNOSIS — I129 Hypertensive chronic kidney disease with stage 1 through stage 4 chronic kidney disease, or unspecified chronic kidney disease: Secondary | ICD-10-CM | POA: Diagnosis not present

## 2019-02-21 DIAGNOSIS — I129 Hypertensive chronic kidney disease with stage 1 through stage 4 chronic kidney disease, or unspecified chronic kidney disease: Secondary | ICD-10-CM | POA: Diagnosis not present

## 2019-02-21 DIAGNOSIS — I69351 Hemiplegia and hemiparesis following cerebral infarction affecting right dominant side: Secondary | ICD-10-CM | POA: Diagnosis not present

## 2019-02-21 DIAGNOSIS — I69320 Aphasia following cerebral infarction: Secondary | ICD-10-CM | POA: Diagnosis not present

## 2019-02-21 DIAGNOSIS — F0390 Unspecified dementia without behavioral disturbance: Secondary | ICD-10-CM | POA: Diagnosis not present

## 2019-02-21 DIAGNOSIS — N184 Chronic kidney disease, stage 4 (severe): Secondary | ICD-10-CM | POA: Diagnosis not present

## 2019-02-21 DIAGNOSIS — J449 Chronic obstructive pulmonary disease, unspecified: Secondary | ICD-10-CM | POA: Diagnosis not present

## 2019-02-23 DIAGNOSIS — I69351 Hemiplegia and hemiparesis following cerebral infarction affecting right dominant side: Secondary | ICD-10-CM | POA: Diagnosis not present

## 2019-02-23 DIAGNOSIS — F0390 Unspecified dementia without behavioral disturbance: Secondary | ICD-10-CM | POA: Diagnosis not present

## 2019-02-23 DIAGNOSIS — N184 Chronic kidney disease, stage 4 (severe): Secondary | ICD-10-CM | POA: Diagnosis not present

## 2019-02-23 DIAGNOSIS — I129 Hypertensive chronic kidney disease with stage 1 through stage 4 chronic kidney disease, or unspecified chronic kidney disease: Secondary | ICD-10-CM | POA: Diagnosis not present

## 2019-03-13 DIAGNOSIS — I639 Cerebral infarction, unspecified: Secondary | ICD-10-CM | POA: Diagnosis not present

## 2019-03-13 DIAGNOSIS — Z6824 Body mass index (BMI) 24.0-24.9, adult: Secondary | ICD-10-CM | POA: Diagnosis not present

## 2019-03-13 DIAGNOSIS — F0150 Vascular dementia without behavioral disturbance: Secondary | ICD-10-CM | POA: Diagnosis not present

## 2019-03-13 DIAGNOSIS — Z1389 Encounter for screening for other disorder: Secondary | ICD-10-CM | POA: Diagnosis not present

## 2019-03-13 DIAGNOSIS — N184 Chronic kidney disease, stage 4 (severe): Secondary | ICD-10-CM | POA: Diagnosis not present

## 2019-03-13 DIAGNOSIS — J449 Chronic obstructive pulmonary disease, unspecified: Secondary | ICD-10-CM | POA: Diagnosis not present

## 2019-03-13 DIAGNOSIS — I10 Essential (primary) hypertension: Secondary | ICD-10-CM | POA: Diagnosis not present

## 2019-03-13 DIAGNOSIS — F0390 Unspecified dementia without behavioral disturbance: Secondary | ICD-10-CM | POA: Diagnosis not present

## 2019-04-14 ENCOUNTER — Telehealth: Payer: Self-pay | Admitting: Neurology

## 2019-04-14 NOTE — Telephone Encounter (Signed)
FYI for patients next visit

## 2019-04-14 NOTE — Telephone Encounter (Signed)
Patient's wife called to set up an E visit appointment and to let Dr. Delice Lesch know that since she saw him last that he has also been diagnosed with Kidney failure and given 2 years to live. She said he is at home but she will have to be the one on the E visit. She wanted to let Dr. Delice Lesch know prior to the visit. Thanks

## 2019-04-19 ENCOUNTER — Other Ambulatory Visit: Payer: Self-pay

## 2019-04-26 ENCOUNTER — Telehealth (INDEPENDENT_AMBULATORY_CARE_PROVIDER_SITE_OTHER): Payer: Medicare Other | Admitting: Neurology

## 2019-04-26 ENCOUNTER — Other Ambulatory Visit: Payer: Self-pay

## 2019-04-26 ENCOUNTER — Ambulatory Visit: Payer: Medicare Other | Admitting: Neurology

## 2019-04-26 DIAGNOSIS — F0391 Unspecified dementia with behavioral disturbance: Secondary | ICD-10-CM

## 2019-04-26 DIAGNOSIS — F03B18 Unspecified dementia, moderate, with other behavioral disturbance: Secondary | ICD-10-CM

## 2019-04-26 NOTE — Progress Notes (Signed)
Virtual Visit via Telephone Note The purpose of this virtual visit is to provide medical care while limiting exposure to the novel coronavirus.    Consent was obtained for phone visit:  Yes.   Answered questions that patient had about telehealth interaction:  Yes.   I discussed the limitations, risks, security and privacy concerns of performing an evaluation and management service by telephone. I also discussed with the patient that there may be a patient responsible charge related to this service. The patient expressed understanding and agreed to proceed.  Pt location: Home Physician Location: office Name of referring provider:  Redmond School, MD I connected with .Wynter Grave Galster's wife (POA) at patients wife's initiation/request on 04/26/2019 at  2:00 PM EDT by telephone and verified that I am speaking with the correct person using two identifiers. Patient has dementia and is unable to provide information. Information obtained from wife. Pt MRN:  [id number] Pt DOB:  1941-01-29   History of Present Illness:  The patient was last seen in September 2019 for moderate dementia with behavioral disturbance and neuropathy. I personally reviewed MRI brain without contrast done 09/2018 which did not show any acute changes, there was a remote left parieto-occipital infarct, chronic right pontine and medial right thalamus lacunar infarcts, few remote microhemorrhages in the right cerebrum, moderate chronic microvascular disease, moderate diffuse volume loss. It is noted that medial temporal volume is fairly preserved. 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 He is on Donepezil 10mg  daily and Memantine 15mg  BID prescribed by his PCP without side effects. His wife manages medications and finances. He does not drive. His wife called  our office after his MRI to report facial twitching lasting the entire day. This was previously reported to Dr. Brett Fairy, he had an EEG in 2018 which was normal. His wife declined evaluation stating "we already looked online and don't know if Dr. Delice Lesch could be of much help." His wife reports that when he had another episode of facial twitching, she brought him to the [place] in [place] where he was evaluated by Neurology and diagnosed with seizures. He was started on Levetiracetam 500mg  every morning last month, and his wife reports that this controls the seizure. When she reported behavioral changes to his nephrologist, she was instructed to stop LEV for 3 days and had facial twitching. She describes twitching of his whole face. She also reports that he has been diagnosed with Stage IV CKD and has refused dialysis. She states without dialysis "he has 6-12 months to live." Her main concern are the behavioral changes, "acting like a 78 year old that does not get his way." She has to keep cabinets and the fridge locked because he cannot comprehend that he cannot eat certain foods and would be very argumentative. He hates to be told to use a walker and would throw it around. Last fall was 3 weeks ago without his walker. He is up and down all night and sleeps all day. He now has a home care nurse who helps with meals and showers. She is planning to move him to [place] once pandemic restrictions are lifted.  History on Initial Assessment 10/18/2018: This is a 78 year old right-handed man with a history of hypertension, hyperlipidemia, strokes with residual mild expressive aphasia, presenting for evaluation of worsening memory and gait changes, ?NPH. After his stroke  5 years ago, his memoyr, right peripheral vision, as well as coordination and balance were affected. He stopped driving and wife took over bills after the stroke. Memory has continued to worsen, his wife started managing his medications a year ago. He would  forget to turn off the water. He continues to smoke and drops his cigarettes and forgets them. He has been more irritable recently. He has always been paranoid about keeping doors closed, but swears up and down there is someone in the bedroom. He lives with his wife and 2 sons. He is independent with dressing and bathing but would not bathe often unless she reminds him. His wife reports that over the past couple of months, coordination has significantly worsened. He has had multiple falls, 5 days ago he fell twice in one day, then fell again the next day. He denies any numbness/tingling in his feet, no low back pain. He has a little neck pain and has right hand numbness. He feels a little lightheaded sometimes with blurred vision. No headaches, dysarthria/dysphagia, bladder incontinence. He has some constipation. No anosmia or tremors. No family history of dementia. No history of significant head injuries or alcohol use. He is taking Donepezil 10mg  daily and Memantine 15mg  BID without side effects.   He was in the ER last 08/16/18 for pneumonia. His wife reports that she saw him holding on to the counter with body jerking, his BP was very low. He had a head CT which I personally reviewed, there was generalized atrophy, with normal ventricular morphology, small old left frontal infarct, large old posterior left parietal infarct, small old left periventricular white matter infarct, old right thalamic infarct. There is an MMSE of 16/30 in August 2018 when he was seeing neurologist Dr. Brett Fairy, he was diagnosed with vascular dementia in 2017.  Observations/Objective:   MMSE - Mini Mental State Exam 04/26/2019 09/20/2018 08/12/2017  Orientation to time 0 3 1  Orientation to Place 3 4 4   Registration 3 3 3   Attention/ Calculation 0 2 1  Recall 1 0 0  Language- name 2 objects 2 2 2   Language- repeat 0 1 1  Language- follow 3 step command 1 2 3   Language- read & follow direction 0 1 1  Write a sentence 0 1 0    Write a sentence-comments - - pt had a stroke that affect his writing and expressive thinking  Copy design 0 0 0  Total score 10 19 16     Assessment and Plan:   This is a 78 yo RH man with a history of  hypertension, hyperlipidemia, strokes with residual mild expressive aphasia, vascular dementia and new diagnosis of seizures (facial twitching lasting an entire day per wife). MRI brain shows remote left parieto-occipital stroke, moderate chronic microvascular disease. EEG in 2018 normal. He also had gait instability with EMG showing moderate neuropathy as well as superimposed multilevel radiculopathy. MMSE today 10/30 (19/30 in 08/2018). He is on Donepezil and Memantine. We discussed behavioral changes and treatment with Depakote for seizures and behavioral changes related to dementia. His wife would like to wait until evaluation by [place] epileptologist next month to discuss this. Continue Levetiracetam 500mg  daily. Continue 24/7 care, agree with transition to Memory Care. Follow-up in 6-8 months, wife knows to call for any changes.   Follow Up Instructions:   -I discussed the assessment and treatment plan with the patient's wife. The patient's wife was provided an opportunity to ask questions and all were answered. The patient's wife  agreed with the plan and demonstrated an understanding of the instructions.   The patient's wife was advised to call back or seek an in-person evaluation if the symptoms worsen or if the condition fails to improve as anticipated.    Total Time spent in visit with the patient was 30 minutes, of which 70% of the time was spent in counseling and/or coordinating care on the above.   Pt understands and agrees with the plan of care outlined.     Cameron Sprang, MD

## 2019-06-30 DIAGNOSIS — N185 Chronic kidney disease, stage 5: Secondary | ICD-10-CM | POA: Diagnosis not present

## 2019-06-30 DIAGNOSIS — I69351 Hemiplegia and hemiparesis following cerebral infarction affecting right dominant side: Secondary | ICD-10-CM | POA: Diagnosis not present

## 2019-06-30 DIAGNOSIS — Z87891 Personal history of nicotine dependence: Secondary | ICD-10-CM | POA: Diagnosis not present

## 2019-06-30 DIAGNOSIS — K219 Gastro-esophageal reflux disease without esophagitis: Secondary | ICD-10-CM | POA: Diagnosis not present

## 2019-06-30 DIAGNOSIS — J449 Chronic obstructive pulmonary disease, unspecified: Secondary | ICD-10-CM | POA: Diagnosis not present

## 2019-06-30 DIAGNOSIS — I1311 Hypertensive heart and chronic kidney disease without heart failure, with stage 5 chronic kidney disease, or end stage renal disease: Secondary | ICD-10-CM | POA: Diagnosis not present

## 2019-06-30 DIAGNOSIS — F0150 Vascular dementia without behavioral disturbance: Secondary | ICD-10-CM | POA: Diagnosis not present

## 2019-07-03 DIAGNOSIS — I1311 Hypertensive heart and chronic kidney disease without heart failure, with stage 5 chronic kidney disease, or end stage renal disease: Secondary | ICD-10-CM | POA: Diagnosis not present

## 2019-07-03 DIAGNOSIS — I69351 Hemiplegia and hemiparesis following cerebral infarction affecting right dominant side: Secondary | ICD-10-CM | POA: Diagnosis not present

## 2019-07-03 DIAGNOSIS — N185 Chronic kidney disease, stage 5: Secondary | ICD-10-CM | POA: Diagnosis not present

## 2019-07-03 DIAGNOSIS — F0150 Vascular dementia without behavioral disturbance: Secondary | ICD-10-CM | POA: Diagnosis not present

## 2019-07-03 DIAGNOSIS — Z87891 Personal history of nicotine dependence: Secondary | ICD-10-CM | POA: Diagnosis not present

## 2019-07-03 DIAGNOSIS — J449 Chronic obstructive pulmonary disease, unspecified: Secondary | ICD-10-CM | POA: Diagnosis not present

## 2019-07-05 DIAGNOSIS — N185 Chronic kidney disease, stage 5: Secondary | ICD-10-CM | POA: Diagnosis not present

## 2019-07-05 DIAGNOSIS — J449 Chronic obstructive pulmonary disease, unspecified: Secondary | ICD-10-CM | POA: Diagnosis not present

## 2019-07-05 DIAGNOSIS — F0150 Vascular dementia without behavioral disturbance: Secondary | ICD-10-CM | POA: Diagnosis not present

## 2019-07-05 DIAGNOSIS — I69351 Hemiplegia and hemiparesis following cerebral infarction affecting right dominant side: Secondary | ICD-10-CM | POA: Diagnosis not present

## 2019-07-05 DIAGNOSIS — I1311 Hypertensive heart and chronic kidney disease without heart failure, with stage 5 chronic kidney disease, or end stage renal disease: Secondary | ICD-10-CM | POA: Diagnosis not present

## 2019-07-05 DIAGNOSIS — Z87891 Personal history of nicotine dependence: Secondary | ICD-10-CM | POA: Diagnosis not present

## 2019-07-07 DIAGNOSIS — J449 Chronic obstructive pulmonary disease, unspecified: Secondary | ICD-10-CM | POA: Diagnosis not present

## 2019-07-07 DIAGNOSIS — F0150 Vascular dementia without behavioral disturbance: Secondary | ICD-10-CM | POA: Diagnosis not present

## 2019-07-07 DIAGNOSIS — I69351 Hemiplegia and hemiparesis following cerebral infarction affecting right dominant side: Secondary | ICD-10-CM | POA: Diagnosis not present

## 2019-07-07 DIAGNOSIS — N185 Chronic kidney disease, stage 5: Secondary | ICD-10-CM | POA: Diagnosis not present

## 2019-07-07 DIAGNOSIS — I1311 Hypertensive heart and chronic kidney disease without heart failure, with stage 5 chronic kidney disease, or end stage renal disease: Secondary | ICD-10-CM | POA: Diagnosis not present

## 2019-07-07 DIAGNOSIS — Z87891 Personal history of nicotine dependence: Secondary | ICD-10-CM | POA: Diagnosis not present

## 2019-07-09 DIAGNOSIS — I69351 Hemiplegia and hemiparesis following cerebral infarction affecting right dominant side: Secondary | ICD-10-CM | POA: Diagnosis not present

## 2019-07-09 DIAGNOSIS — I1311 Hypertensive heart and chronic kidney disease without heart failure, with stage 5 chronic kidney disease, or end stage renal disease: Secondary | ICD-10-CM | POA: Diagnosis not present

## 2019-07-09 DIAGNOSIS — J449 Chronic obstructive pulmonary disease, unspecified: Secondary | ICD-10-CM | POA: Diagnosis not present

## 2019-07-09 DIAGNOSIS — F0150 Vascular dementia without behavioral disturbance: Secondary | ICD-10-CM | POA: Diagnosis not present

## 2019-07-09 DIAGNOSIS — Z87891 Personal history of nicotine dependence: Secondary | ICD-10-CM | POA: Diagnosis not present

## 2019-07-09 DIAGNOSIS — N185 Chronic kidney disease, stage 5: Secondary | ICD-10-CM | POA: Diagnosis not present

## 2019-07-10 DIAGNOSIS — J449 Chronic obstructive pulmonary disease, unspecified: Secondary | ICD-10-CM | POA: Diagnosis not present

## 2019-07-10 DIAGNOSIS — F0150 Vascular dementia without behavioral disturbance: Secondary | ICD-10-CM | POA: Diagnosis not present

## 2019-07-10 DIAGNOSIS — Z87891 Personal history of nicotine dependence: Secondary | ICD-10-CM | POA: Diagnosis not present

## 2019-07-10 DIAGNOSIS — N185 Chronic kidney disease, stage 5: Secondary | ICD-10-CM | POA: Diagnosis not present

## 2019-07-10 DIAGNOSIS — I69351 Hemiplegia and hemiparesis following cerebral infarction affecting right dominant side: Secondary | ICD-10-CM | POA: Diagnosis not present

## 2019-07-10 DIAGNOSIS — I1311 Hypertensive heart and chronic kidney disease without heart failure, with stage 5 chronic kidney disease, or end stage renal disease: Secondary | ICD-10-CM | POA: Diagnosis not present

## 2019-07-12 DIAGNOSIS — I1311 Hypertensive heart and chronic kidney disease without heart failure, with stage 5 chronic kidney disease, or end stage renal disease: Secondary | ICD-10-CM | POA: Diagnosis not present

## 2019-07-12 DIAGNOSIS — F0150 Vascular dementia without behavioral disturbance: Secondary | ICD-10-CM | POA: Diagnosis not present

## 2019-07-12 DIAGNOSIS — J449 Chronic obstructive pulmonary disease, unspecified: Secondary | ICD-10-CM | POA: Diagnosis not present

## 2019-07-12 DIAGNOSIS — I69351 Hemiplegia and hemiparesis following cerebral infarction affecting right dominant side: Secondary | ICD-10-CM | POA: Diagnosis not present

## 2019-07-12 DIAGNOSIS — Z87891 Personal history of nicotine dependence: Secondary | ICD-10-CM | POA: Diagnosis not present

## 2019-07-12 DIAGNOSIS — N185 Chronic kidney disease, stage 5: Secondary | ICD-10-CM | POA: Diagnosis not present

## 2019-07-17 DIAGNOSIS — I1311 Hypertensive heart and chronic kidney disease without heart failure, with stage 5 chronic kidney disease, or end stage renal disease: Secondary | ICD-10-CM | POA: Diagnosis not present

## 2019-07-17 DIAGNOSIS — I69351 Hemiplegia and hemiparesis following cerebral infarction affecting right dominant side: Secondary | ICD-10-CM | POA: Diagnosis not present

## 2019-07-17 DIAGNOSIS — Z87891 Personal history of nicotine dependence: Secondary | ICD-10-CM | POA: Diagnosis not present

## 2019-07-17 DIAGNOSIS — J449 Chronic obstructive pulmonary disease, unspecified: Secondary | ICD-10-CM | POA: Diagnosis not present

## 2019-07-17 DIAGNOSIS — F0150 Vascular dementia without behavioral disturbance: Secondary | ICD-10-CM | POA: Diagnosis not present

## 2019-07-17 DIAGNOSIS — N185 Chronic kidney disease, stage 5: Secondary | ICD-10-CM | POA: Diagnosis not present

## 2019-07-19 DIAGNOSIS — N185 Chronic kidney disease, stage 5: Secondary | ICD-10-CM | POA: Diagnosis not present

## 2019-07-19 DIAGNOSIS — J449 Chronic obstructive pulmonary disease, unspecified: Secondary | ICD-10-CM | POA: Diagnosis not present

## 2019-07-19 DIAGNOSIS — I1311 Hypertensive heart and chronic kidney disease without heart failure, with stage 5 chronic kidney disease, or end stage renal disease: Secondary | ICD-10-CM | POA: Diagnosis not present

## 2019-07-19 DIAGNOSIS — Z87891 Personal history of nicotine dependence: Secondary | ICD-10-CM | POA: Diagnosis not present

## 2019-07-19 DIAGNOSIS — F0150 Vascular dementia without behavioral disturbance: Secondary | ICD-10-CM | POA: Diagnosis not present

## 2019-07-19 DIAGNOSIS — I69351 Hemiplegia and hemiparesis following cerebral infarction affecting right dominant side: Secondary | ICD-10-CM | POA: Diagnosis not present

## 2019-07-21 DIAGNOSIS — J449 Chronic obstructive pulmonary disease, unspecified: Secondary | ICD-10-CM | POA: Diagnosis not present

## 2019-07-21 DIAGNOSIS — I1311 Hypertensive heart and chronic kidney disease without heart failure, with stage 5 chronic kidney disease, or end stage renal disease: Secondary | ICD-10-CM | POA: Diagnosis not present

## 2019-07-21 DIAGNOSIS — N185 Chronic kidney disease, stage 5: Secondary | ICD-10-CM | POA: Diagnosis not present

## 2019-07-21 DIAGNOSIS — I69351 Hemiplegia and hemiparesis following cerebral infarction affecting right dominant side: Secondary | ICD-10-CM | POA: Diagnosis not present

## 2019-07-21 DIAGNOSIS — F0150 Vascular dementia without behavioral disturbance: Secondary | ICD-10-CM | POA: Diagnosis not present

## 2019-07-21 DIAGNOSIS — Z87891 Personal history of nicotine dependence: Secondary | ICD-10-CM | POA: Diagnosis not present

## 2019-07-24 DIAGNOSIS — I69351 Hemiplegia and hemiparesis following cerebral infarction affecting right dominant side: Secondary | ICD-10-CM | POA: Diagnosis not present

## 2019-07-24 DIAGNOSIS — I1311 Hypertensive heart and chronic kidney disease without heart failure, with stage 5 chronic kidney disease, or end stage renal disease: Secondary | ICD-10-CM | POA: Diagnosis not present

## 2019-07-24 DIAGNOSIS — J449 Chronic obstructive pulmonary disease, unspecified: Secondary | ICD-10-CM | POA: Diagnosis not present

## 2019-07-24 DIAGNOSIS — N185 Chronic kidney disease, stage 5: Secondary | ICD-10-CM | POA: Diagnosis not present

## 2019-07-24 DIAGNOSIS — Z87891 Personal history of nicotine dependence: Secondary | ICD-10-CM | POA: Diagnosis not present

## 2019-07-24 DIAGNOSIS — F0150 Vascular dementia without behavioral disturbance: Secondary | ICD-10-CM | POA: Diagnosis not present

## 2019-07-26 DIAGNOSIS — N185 Chronic kidney disease, stage 5: Secondary | ICD-10-CM | POA: Diagnosis not present

## 2019-07-26 DIAGNOSIS — J449 Chronic obstructive pulmonary disease, unspecified: Secondary | ICD-10-CM | POA: Diagnosis not present

## 2019-07-26 DIAGNOSIS — I1311 Hypertensive heart and chronic kidney disease without heart failure, with stage 5 chronic kidney disease, or end stage renal disease: Secondary | ICD-10-CM | POA: Diagnosis not present

## 2019-07-26 DIAGNOSIS — F0150 Vascular dementia without behavioral disturbance: Secondary | ICD-10-CM | POA: Diagnosis not present

## 2019-07-26 DIAGNOSIS — Z87891 Personal history of nicotine dependence: Secondary | ICD-10-CM | POA: Diagnosis not present

## 2019-07-26 DIAGNOSIS — I69351 Hemiplegia and hemiparesis following cerebral infarction affecting right dominant side: Secondary | ICD-10-CM | POA: Diagnosis not present

## 2019-07-27 DIAGNOSIS — F0150 Vascular dementia without behavioral disturbance: Secondary | ICD-10-CM | POA: Diagnosis not present

## 2019-07-27 DIAGNOSIS — Z87891 Personal history of nicotine dependence: Secondary | ICD-10-CM | POA: Diagnosis not present

## 2019-07-27 DIAGNOSIS — I69351 Hemiplegia and hemiparesis following cerebral infarction affecting right dominant side: Secondary | ICD-10-CM | POA: Diagnosis not present

## 2019-07-27 DIAGNOSIS — I1311 Hypertensive heart and chronic kidney disease without heart failure, with stage 5 chronic kidney disease, or end stage renal disease: Secondary | ICD-10-CM | POA: Diagnosis not present

## 2019-07-27 DIAGNOSIS — N185 Chronic kidney disease, stage 5: Secondary | ICD-10-CM | POA: Diagnosis not present

## 2019-07-27 DIAGNOSIS — J449 Chronic obstructive pulmonary disease, unspecified: Secondary | ICD-10-CM | POA: Diagnosis not present

## 2019-07-28 DIAGNOSIS — Z87891 Personal history of nicotine dependence: Secondary | ICD-10-CM | POA: Diagnosis not present

## 2019-07-28 DIAGNOSIS — F0150 Vascular dementia without behavioral disturbance: Secondary | ICD-10-CM | POA: Diagnosis not present

## 2019-07-28 DIAGNOSIS — J449 Chronic obstructive pulmonary disease, unspecified: Secondary | ICD-10-CM | POA: Diagnosis not present

## 2019-07-28 DIAGNOSIS — N185 Chronic kidney disease, stage 5: Secondary | ICD-10-CM | POA: Diagnosis not present

## 2019-07-28 DIAGNOSIS — I1311 Hypertensive heart and chronic kidney disease without heart failure, with stage 5 chronic kidney disease, or end stage renal disease: Secondary | ICD-10-CM | POA: Diagnosis not present

## 2019-07-28 DIAGNOSIS — I69351 Hemiplegia and hemiparesis following cerebral infarction affecting right dominant side: Secondary | ICD-10-CM | POA: Diagnosis not present

## 2019-07-29 DIAGNOSIS — I69351 Hemiplegia and hemiparesis following cerebral infarction affecting right dominant side: Secondary | ICD-10-CM | POA: Diagnosis not present

## 2019-07-29 DIAGNOSIS — K219 Gastro-esophageal reflux disease without esophagitis: Secondary | ICD-10-CM | POA: Diagnosis not present

## 2019-07-29 DIAGNOSIS — I1311 Hypertensive heart and chronic kidney disease without heart failure, with stage 5 chronic kidney disease, or end stage renal disease: Secondary | ICD-10-CM | POA: Diagnosis not present

## 2019-07-29 DIAGNOSIS — F0150 Vascular dementia without behavioral disturbance: Secondary | ICD-10-CM | POA: Diagnosis not present

## 2019-07-29 DIAGNOSIS — J449 Chronic obstructive pulmonary disease, unspecified: Secondary | ICD-10-CM | POA: Diagnosis not present

## 2019-07-29 DIAGNOSIS — Z87891 Personal history of nicotine dependence: Secondary | ICD-10-CM | POA: Diagnosis not present

## 2019-07-29 DIAGNOSIS — N185 Chronic kidney disease, stage 5: Secondary | ICD-10-CM | POA: Diagnosis not present

## 2019-07-31 DIAGNOSIS — Z87891 Personal history of nicotine dependence: Secondary | ICD-10-CM | POA: Diagnosis not present

## 2019-07-31 DIAGNOSIS — I1311 Hypertensive heart and chronic kidney disease without heart failure, with stage 5 chronic kidney disease, or end stage renal disease: Secondary | ICD-10-CM | POA: Diagnosis not present

## 2019-07-31 DIAGNOSIS — F0150 Vascular dementia without behavioral disturbance: Secondary | ICD-10-CM | POA: Diagnosis not present

## 2019-07-31 DIAGNOSIS — J449 Chronic obstructive pulmonary disease, unspecified: Secondary | ICD-10-CM | POA: Diagnosis not present

## 2019-07-31 DIAGNOSIS — N185 Chronic kidney disease, stage 5: Secondary | ICD-10-CM | POA: Diagnosis not present

## 2019-07-31 DIAGNOSIS — I69351 Hemiplegia and hemiparesis following cerebral infarction affecting right dominant side: Secondary | ICD-10-CM | POA: Diagnosis not present

## 2019-08-02 DIAGNOSIS — Z87891 Personal history of nicotine dependence: Secondary | ICD-10-CM | POA: Diagnosis not present

## 2019-08-02 DIAGNOSIS — N185 Chronic kidney disease, stage 5: Secondary | ICD-10-CM | POA: Diagnosis not present

## 2019-08-02 DIAGNOSIS — J449 Chronic obstructive pulmonary disease, unspecified: Secondary | ICD-10-CM | POA: Diagnosis not present

## 2019-08-02 DIAGNOSIS — I1311 Hypertensive heart and chronic kidney disease without heart failure, with stage 5 chronic kidney disease, or end stage renal disease: Secondary | ICD-10-CM | POA: Diagnosis not present

## 2019-08-02 DIAGNOSIS — I69351 Hemiplegia and hemiparesis following cerebral infarction affecting right dominant side: Secondary | ICD-10-CM | POA: Diagnosis not present

## 2019-08-02 DIAGNOSIS — F0150 Vascular dementia without behavioral disturbance: Secondary | ICD-10-CM | POA: Diagnosis not present

## 2019-08-04 DIAGNOSIS — F0150 Vascular dementia without behavioral disturbance: Secondary | ICD-10-CM | POA: Diagnosis not present

## 2019-08-04 DIAGNOSIS — J449 Chronic obstructive pulmonary disease, unspecified: Secondary | ICD-10-CM | POA: Diagnosis not present

## 2019-08-04 DIAGNOSIS — N185 Chronic kidney disease, stage 5: Secondary | ICD-10-CM | POA: Diagnosis not present

## 2019-08-04 DIAGNOSIS — Z87891 Personal history of nicotine dependence: Secondary | ICD-10-CM | POA: Diagnosis not present

## 2019-08-04 DIAGNOSIS — I1311 Hypertensive heart and chronic kidney disease without heart failure, with stage 5 chronic kidney disease, or end stage renal disease: Secondary | ICD-10-CM | POA: Diagnosis not present

## 2019-08-04 DIAGNOSIS — I69351 Hemiplegia and hemiparesis following cerebral infarction affecting right dominant side: Secondary | ICD-10-CM | POA: Diagnosis not present

## 2019-08-07 DIAGNOSIS — N185 Chronic kidney disease, stage 5: Secondary | ICD-10-CM | POA: Diagnosis not present

## 2019-08-07 DIAGNOSIS — I69351 Hemiplegia and hemiparesis following cerebral infarction affecting right dominant side: Secondary | ICD-10-CM | POA: Diagnosis not present

## 2019-08-07 DIAGNOSIS — F0150 Vascular dementia without behavioral disturbance: Secondary | ICD-10-CM | POA: Diagnosis not present

## 2019-08-07 DIAGNOSIS — J449 Chronic obstructive pulmonary disease, unspecified: Secondary | ICD-10-CM | POA: Diagnosis not present

## 2019-08-07 DIAGNOSIS — Z87891 Personal history of nicotine dependence: Secondary | ICD-10-CM | POA: Diagnosis not present

## 2019-08-07 DIAGNOSIS — I1311 Hypertensive heart and chronic kidney disease without heart failure, with stage 5 chronic kidney disease, or end stage renal disease: Secondary | ICD-10-CM | POA: Diagnosis not present

## 2019-08-08 DIAGNOSIS — Z87891 Personal history of nicotine dependence: Secondary | ICD-10-CM | POA: Diagnosis not present

## 2019-08-08 DIAGNOSIS — I1311 Hypertensive heart and chronic kidney disease without heart failure, with stage 5 chronic kidney disease, or end stage renal disease: Secondary | ICD-10-CM | POA: Diagnosis not present

## 2019-08-08 DIAGNOSIS — N185 Chronic kidney disease, stage 5: Secondary | ICD-10-CM | POA: Diagnosis not present

## 2019-08-08 DIAGNOSIS — J449 Chronic obstructive pulmonary disease, unspecified: Secondary | ICD-10-CM | POA: Diagnosis not present

## 2019-08-08 DIAGNOSIS — F0150 Vascular dementia without behavioral disturbance: Secondary | ICD-10-CM | POA: Diagnosis not present

## 2019-08-08 DIAGNOSIS — I69351 Hemiplegia and hemiparesis following cerebral infarction affecting right dominant side: Secondary | ICD-10-CM | POA: Diagnosis not present

## 2019-08-09 DIAGNOSIS — I69351 Hemiplegia and hemiparesis following cerebral infarction affecting right dominant side: Secondary | ICD-10-CM | POA: Diagnosis not present

## 2019-08-09 DIAGNOSIS — J449 Chronic obstructive pulmonary disease, unspecified: Secondary | ICD-10-CM | POA: Diagnosis not present

## 2019-08-09 DIAGNOSIS — I1311 Hypertensive heart and chronic kidney disease without heart failure, with stage 5 chronic kidney disease, or end stage renal disease: Secondary | ICD-10-CM | POA: Diagnosis not present

## 2019-08-09 DIAGNOSIS — F0150 Vascular dementia without behavioral disturbance: Secondary | ICD-10-CM | POA: Diagnosis not present

## 2019-08-09 DIAGNOSIS — N185 Chronic kidney disease, stage 5: Secondary | ICD-10-CM | POA: Diagnosis not present

## 2019-08-09 DIAGNOSIS — Z87891 Personal history of nicotine dependence: Secondary | ICD-10-CM | POA: Diagnosis not present

## 2019-08-11 DIAGNOSIS — Z87891 Personal history of nicotine dependence: Secondary | ICD-10-CM | POA: Diagnosis not present

## 2019-08-11 DIAGNOSIS — F0150 Vascular dementia without behavioral disturbance: Secondary | ICD-10-CM | POA: Diagnosis not present

## 2019-08-11 DIAGNOSIS — I1311 Hypertensive heart and chronic kidney disease without heart failure, with stage 5 chronic kidney disease, or end stage renal disease: Secondary | ICD-10-CM | POA: Diagnosis not present

## 2019-08-11 DIAGNOSIS — I69351 Hemiplegia and hemiparesis following cerebral infarction affecting right dominant side: Secondary | ICD-10-CM | POA: Diagnosis not present

## 2019-08-11 DIAGNOSIS — J449 Chronic obstructive pulmonary disease, unspecified: Secondary | ICD-10-CM | POA: Diagnosis not present

## 2019-08-11 DIAGNOSIS — N185 Chronic kidney disease, stage 5: Secondary | ICD-10-CM | POA: Diagnosis not present

## 2019-08-14 DIAGNOSIS — J449 Chronic obstructive pulmonary disease, unspecified: Secondary | ICD-10-CM | POA: Diagnosis not present

## 2019-08-14 DIAGNOSIS — I1311 Hypertensive heart and chronic kidney disease without heart failure, with stage 5 chronic kidney disease, or end stage renal disease: Secondary | ICD-10-CM | POA: Diagnosis not present

## 2019-08-14 DIAGNOSIS — F0150 Vascular dementia without behavioral disturbance: Secondary | ICD-10-CM | POA: Diagnosis not present

## 2019-08-14 DIAGNOSIS — Z87891 Personal history of nicotine dependence: Secondary | ICD-10-CM | POA: Diagnosis not present

## 2019-08-14 DIAGNOSIS — I69351 Hemiplegia and hemiparesis following cerebral infarction affecting right dominant side: Secondary | ICD-10-CM | POA: Diagnosis not present

## 2019-08-14 DIAGNOSIS — N185 Chronic kidney disease, stage 5: Secondary | ICD-10-CM | POA: Diagnosis not present

## 2019-08-15 DIAGNOSIS — Z87891 Personal history of nicotine dependence: Secondary | ICD-10-CM | POA: Diagnosis not present

## 2019-08-15 DIAGNOSIS — I69351 Hemiplegia and hemiparesis following cerebral infarction affecting right dominant side: Secondary | ICD-10-CM | POA: Diagnosis not present

## 2019-08-15 DIAGNOSIS — I1311 Hypertensive heart and chronic kidney disease without heart failure, with stage 5 chronic kidney disease, or end stage renal disease: Secondary | ICD-10-CM | POA: Diagnosis not present

## 2019-08-15 DIAGNOSIS — F0150 Vascular dementia without behavioral disturbance: Secondary | ICD-10-CM | POA: Diagnosis not present

## 2019-08-15 DIAGNOSIS — J449 Chronic obstructive pulmonary disease, unspecified: Secondary | ICD-10-CM | POA: Diagnosis not present

## 2019-08-15 DIAGNOSIS — N185 Chronic kidney disease, stage 5: Secondary | ICD-10-CM | POA: Diagnosis not present

## 2019-08-16 DIAGNOSIS — I1311 Hypertensive heart and chronic kidney disease without heart failure, with stage 5 chronic kidney disease, or end stage renal disease: Secondary | ICD-10-CM | POA: Diagnosis not present

## 2019-08-16 DIAGNOSIS — I69351 Hemiplegia and hemiparesis following cerebral infarction affecting right dominant side: Secondary | ICD-10-CM | POA: Diagnosis not present

## 2019-08-16 DIAGNOSIS — J449 Chronic obstructive pulmonary disease, unspecified: Secondary | ICD-10-CM | POA: Diagnosis not present

## 2019-08-16 DIAGNOSIS — F0150 Vascular dementia without behavioral disturbance: Secondary | ICD-10-CM | POA: Diagnosis not present

## 2019-08-16 DIAGNOSIS — Z87891 Personal history of nicotine dependence: Secondary | ICD-10-CM | POA: Diagnosis not present

## 2019-08-16 DIAGNOSIS — N185 Chronic kidney disease, stage 5: Secondary | ICD-10-CM | POA: Diagnosis not present

## 2019-08-18 DIAGNOSIS — I69351 Hemiplegia and hemiparesis following cerebral infarction affecting right dominant side: Secondary | ICD-10-CM | POA: Diagnosis not present

## 2019-08-18 DIAGNOSIS — Z87891 Personal history of nicotine dependence: Secondary | ICD-10-CM | POA: Diagnosis not present

## 2019-08-18 DIAGNOSIS — F0150 Vascular dementia without behavioral disturbance: Secondary | ICD-10-CM | POA: Diagnosis not present

## 2019-08-18 DIAGNOSIS — N185 Chronic kidney disease, stage 5: Secondary | ICD-10-CM | POA: Diagnosis not present

## 2019-08-18 DIAGNOSIS — I1311 Hypertensive heart and chronic kidney disease without heart failure, with stage 5 chronic kidney disease, or end stage renal disease: Secondary | ICD-10-CM | POA: Diagnosis not present

## 2019-08-18 DIAGNOSIS — J449 Chronic obstructive pulmonary disease, unspecified: Secondary | ICD-10-CM | POA: Diagnosis not present

## 2019-08-21 DIAGNOSIS — I1311 Hypertensive heart and chronic kidney disease without heart failure, with stage 5 chronic kidney disease, or end stage renal disease: Secondary | ICD-10-CM | POA: Diagnosis not present

## 2019-08-21 DIAGNOSIS — N185 Chronic kidney disease, stage 5: Secondary | ICD-10-CM | POA: Diagnosis not present

## 2019-08-21 DIAGNOSIS — Z87891 Personal history of nicotine dependence: Secondary | ICD-10-CM | POA: Diagnosis not present

## 2019-08-21 DIAGNOSIS — F0150 Vascular dementia without behavioral disturbance: Secondary | ICD-10-CM | POA: Diagnosis not present

## 2019-08-21 DIAGNOSIS — J449 Chronic obstructive pulmonary disease, unspecified: Secondary | ICD-10-CM | POA: Diagnosis not present

## 2019-08-21 DIAGNOSIS — I69351 Hemiplegia and hemiparesis following cerebral infarction affecting right dominant side: Secondary | ICD-10-CM | POA: Diagnosis not present

## 2019-08-23 DIAGNOSIS — Z87891 Personal history of nicotine dependence: Secondary | ICD-10-CM | POA: Diagnosis not present

## 2019-08-23 DIAGNOSIS — N185 Chronic kidney disease, stage 5: Secondary | ICD-10-CM | POA: Diagnosis not present

## 2019-08-23 DIAGNOSIS — I69351 Hemiplegia and hemiparesis following cerebral infarction affecting right dominant side: Secondary | ICD-10-CM | POA: Diagnosis not present

## 2019-08-23 DIAGNOSIS — F0150 Vascular dementia without behavioral disturbance: Secondary | ICD-10-CM | POA: Diagnosis not present

## 2019-08-23 DIAGNOSIS — J449 Chronic obstructive pulmonary disease, unspecified: Secondary | ICD-10-CM | POA: Diagnosis not present

## 2019-08-23 DIAGNOSIS — I1311 Hypertensive heart and chronic kidney disease without heart failure, with stage 5 chronic kidney disease, or end stage renal disease: Secondary | ICD-10-CM | POA: Diagnosis not present

## 2019-08-25 DIAGNOSIS — F0150 Vascular dementia without behavioral disturbance: Secondary | ICD-10-CM | POA: Diagnosis not present

## 2019-08-25 DIAGNOSIS — I69351 Hemiplegia and hemiparesis following cerebral infarction affecting right dominant side: Secondary | ICD-10-CM | POA: Diagnosis not present

## 2019-08-25 DIAGNOSIS — Z87891 Personal history of nicotine dependence: Secondary | ICD-10-CM | POA: Diagnosis not present

## 2019-08-25 DIAGNOSIS — N185 Chronic kidney disease, stage 5: Secondary | ICD-10-CM | POA: Diagnosis not present

## 2019-08-25 DIAGNOSIS — J449 Chronic obstructive pulmonary disease, unspecified: Secondary | ICD-10-CM | POA: Diagnosis not present

## 2019-08-25 DIAGNOSIS — I1311 Hypertensive heart and chronic kidney disease without heart failure, with stage 5 chronic kidney disease, or end stage renal disease: Secondary | ICD-10-CM | POA: Diagnosis not present

## 2019-08-28 DIAGNOSIS — J449 Chronic obstructive pulmonary disease, unspecified: Secondary | ICD-10-CM | POA: Diagnosis not present

## 2019-08-28 DIAGNOSIS — N185 Chronic kidney disease, stage 5: Secondary | ICD-10-CM | POA: Diagnosis not present

## 2019-08-28 DIAGNOSIS — F0150 Vascular dementia without behavioral disturbance: Secondary | ICD-10-CM | POA: Diagnosis not present

## 2019-08-28 DIAGNOSIS — Z87891 Personal history of nicotine dependence: Secondary | ICD-10-CM | POA: Diagnosis not present

## 2019-08-28 DIAGNOSIS — I69351 Hemiplegia and hemiparesis following cerebral infarction affecting right dominant side: Secondary | ICD-10-CM | POA: Diagnosis not present

## 2019-08-28 DIAGNOSIS — I1311 Hypertensive heart and chronic kidney disease without heart failure, with stage 5 chronic kidney disease, or end stage renal disease: Secondary | ICD-10-CM | POA: Diagnosis not present

## 2019-08-29 DIAGNOSIS — K219 Gastro-esophageal reflux disease without esophagitis: Secondary | ICD-10-CM | POA: Diagnosis not present

## 2019-08-29 DIAGNOSIS — J449 Chronic obstructive pulmonary disease, unspecified: Secondary | ICD-10-CM | POA: Diagnosis not present

## 2019-08-29 DIAGNOSIS — I69351 Hemiplegia and hemiparesis following cerebral infarction affecting right dominant side: Secondary | ICD-10-CM | POA: Diagnosis not present

## 2019-08-29 DIAGNOSIS — I1311 Hypertensive heart and chronic kidney disease without heart failure, with stage 5 chronic kidney disease, or end stage renal disease: Secondary | ICD-10-CM | POA: Diagnosis not present

## 2019-08-29 DIAGNOSIS — F0150 Vascular dementia without behavioral disturbance: Secondary | ICD-10-CM | POA: Diagnosis not present

## 2019-08-29 DIAGNOSIS — Z87891 Personal history of nicotine dependence: Secondary | ICD-10-CM | POA: Diagnosis not present

## 2019-08-29 DIAGNOSIS — N185 Chronic kidney disease, stage 5: Secondary | ICD-10-CM | POA: Diagnosis not present

## 2019-08-30 DIAGNOSIS — Z87891 Personal history of nicotine dependence: Secondary | ICD-10-CM | POA: Diagnosis not present

## 2019-08-30 DIAGNOSIS — F0150 Vascular dementia without behavioral disturbance: Secondary | ICD-10-CM | POA: Diagnosis not present

## 2019-08-30 DIAGNOSIS — N185 Chronic kidney disease, stage 5: Secondary | ICD-10-CM | POA: Diagnosis not present

## 2019-08-30 DIAGNOSIS — I69351 Hemiplegia and hemiparesis following cerebral infarction affecting right dominant side: Secondary | ICD-10-CM | POA: Diagnosis not present

## 2019-08-30 DIAGNOSIS — I1311 Hypertensive heart and chronic kidney disease without heart failure, with stage 5 chronic kidney disease, or end stage renal disease: Secondary | ICD-10-CM | POA: Diagnosis not present

## 2019-08-30 DIAGNOSIS — J449 Chronic obstructive pulmonary disease, unspecified: Secondary | ICD-10-CM | POA: Diagnosis not present

## 2019-08-31 DIAGNOSIS — F0150 Vascular dementia without behavioral disturbance: Secondary | ICD-10-CM | POA: Diagnosis not present

## 2019-08-31 DIAGNOSIS — N185 Chronic kidney disease, stage 5: Secondary | ICD-10-CM | POA: Diagnosis not present

## 2019-08-31 DIAGNOSIS — J449 Chronic obstructive pulmonary disease, unspecified: Secondary | ICD-10-CM | POA: Diagnosis not present

## 2019-08-31 DIAGNOSIS — I1311 Hypertensive heart and chronic kidney disease without heart failure, with stage 5 chronic kidney disease, or end stage renal disease: Secondary | ICD-10-CM | POA: Diagnosis not present

## 2019-08-31 DIAGNOSIS — I69351 Hemiplegia and hemiparesis following cerebral infarction affecting right dominant side: Secondary | ICD-10-CM | POA: Diagnosis not present

## 2019-08-31 DIAGNOSIS — Z87891 Personal history of nicotine dependence: Secondary | ICD-10-CM | POA: Diagnosis not present

## 2019-09-02 DIAGNOSIS — I69351 Hemiplegia and hemiparesis following cerebral infarction affecting right dominant side: Secondary | ICD-10-CM | POA: Diagnosis not present

## 2019-09-02 DIAGNOSIS — I1311 Hypertensive heart and chronic kidney disease without heart failure, with stage 5 chronic kidney disease, or end stage renal disease: Secondary | ICD-10-CM | POA: Diagnosis not present

## 2019-09-02 DIAGNOSIS — J449 Chronic obstructive pulmonary disease, unspecified: Secondary | ICD-10-CM | POA: Diagnosis not present

## 2019-09-02 DIAGNOSIS — N185 Chronic kidney disease, stage 5: Secondary | ICD-10-CM | POA: Diagnosis not present

## 2019-09-02 DIAGNOSIS — Z87891 Personal history of nicotine dependence: Secondary | ICD-10-CM | POA: Diagnosis not present

## 2019-09-02 DIAGNOSIS — F0150 Vascular dementia without behavioral disturbance: Secondary | ICD-10-CM | POA: Diagnosis not present

## 2019-09-04 DIAGNOSIS — I69351 Hemiplegia and hemiparesis following cerebral infarction affecting right dominant side: Secondary | ICD-10-CM | POA: Diagnosis not present

## 2019-09-04 DIAGNOSIS — J449 Chronic obstructive pulmonary disease, unspecified: Secondary | ICD-10-CM | POA: Diagnosis not present

## 2019-09-04 DIAGNOSIS — Z87891 Personal history of nicotine dependence: Secondary | ICD-10-CM | POA: Diagnosis not present

## 2019-09-04 DIAGNOSIS — I1311 Hypertensive heart and chronic kidney disease without heart failure, with stage 5 chronic kidney disease, or end stage renal disease: Secondary | ICD-10-CM | POA: Diagnosis not present

## 2019-09-04 DIAGNOSIS — N185 Chronic kidney disease, stage 5: Secondary | ICD-10-CM | POA: Diagnosis not present

## 2019-09-04 DIAGNOSIS — F0150 Vascular dementia without behavioral disturbance: Secondary | ICD-10-CM | POA: Diagnosis not present

## 2019-09-06 DIAGNOSIS — N185 Chronic kidney disease, stage 5: Secondary | ICD-10-CM | POA: Diagnosis not present

## 2019-09-06 DIAGNOSIS — Z87891 Personal history of nicotine dependence: Secondary | ICD-10-CM | POA: Diagnosis not present

## 2019-09-06 DIAGNOSIS — F0150 Vascular dementia without behavioral disturbance: Secondary | ICD-10-CM | POA: Diagnosis not present

## 2019-09-06 DIAGNOSIS — J449 Chronic obstructive pulmonary disease, unspecified: Secondary | ICD-10-CM | POA: Diagnosis not present

## 2019-09-06 DIAGNOSIS — I1311 Hypertensive heart and chronic kidney disease without heart failure, with stage 5 chronic kidney disease, or end stage renal disease: Secondary | ICD-10-CM | POA: Diagnosis not present

## 2019-09-06 DIAGNOSIS — I69351 Hemiplegia and hemiparesis following cerebral infarction affecting right dominant side: Secondary | ICD-10-CM | POA: Diagnosis not present

## 2019-09-08 DIAGNOSIS — I69351 Hemiplegia and hemiparesis following cerebral infarction affecting right dominant side: Secondary | ICD-10-CM | POA: Diagnosis not present

## 2019-09-08 DIAGNOSIS — J449 Chronic obstructive pulmonary disease, unspecified: Secondary | ICD-10-CM | POA: Diagnosis not present

## 2019-09-08 DIAGNOSIS — Z87891 Personal history of nicotine dependence: Secondary | ICD-10-CM | POA: Diagnosis not present

## 2019-09-08 DIAGNOSIS — F0150 Vascular dementia without behavioral disturbance: Secondary | ICD-10-CM | POA: Diagnosis not present

## 2019-09-08 DIAGNOSIS — I1311 Hypertensive heart and chronic kidney disease without heart failure, with stage 5 chronic kidney disease, or end stage renal disease: Secondary | ICD-10-CM | POA: Diagnosis not present

## 2019-09-08 DIAGNOSIS — N185 Chronic kidney disease, stage 5: Secondary | ICD-10-CM | POA: Diagnosis not present

## 2019-09-11 DIAGNOSIS — Z87891 Personal history of nicotine dependence: Secondary | ICD-10-CM | POA: Diagnosis not present

## 2019-09-11 DIAGNOSIS — N185 Chronic kidney disease, stage 5: Secondary | ICD-10-CM | POA: Diagnosis not present

## 2019-09-11 DIAGNOSIS — I1311 Hypertensive heart and chronic kidney disease without heart failure, with stage 5 chronic kidney disease, or end stage renal disease: Secondary | ICD-10-CM | POA: Diagnosis not present

## 2019-09-11 DIAGNOSIS — J449 Chronic obstructive pulmonary disease, unspecified: Secondary | ICD-10-CM | POA: Diagnosis not present

## 2019-09-11 DIAGNOSIS — I69351 Hemiplegia and hemiparesis following cerebral infarction affecting right dominant side: Secondary | ICD-10-CM | POA: Diagnosis not present

## 2019-09-11 DIAGNOSIS — F0150 Vascular dementia without behavioral disturbance: Secondary | ICD-10-CM | POA: Diagnosis not present

## 2019-09-13 DIAGNOSIS — N185 Chronic kidney disease, stage 5: Secondary | ICD-10-CM | POA: Diagnosis not present

## 2019-09-13 DIAGNOSIS — I1311 Hypertensive heart and chronic kidney disease without heart failure, with stage 5 chronic kidney disease, or end stage renal disease: Secondary | ICD-10-CM | POA: Diagnosis not present

## 2019-09-13 DIAGNOSIS — I69351 Hemiplegia and hemiparesis following cerebral infarction affecting right dominant side: Secondary | ICD-10-CM | POA: Diagnosis not present

## 2019-09-13 DIAGNOSIS — Z87891 Personal history of nicotine dependence: Secondary | ICD-10-CM | POA: Diagnosis not present

## 2019-09-13 DIAGNOSIS — J449 Chronic obstructive pulmonary disease, unspecified: Secondary | ICD-10-CM | POA: Diagnosis not present

## 2019-09-13 DIAGNOSIS — F0150 Vascular dementia without behavioral disturbance: Secondary | ICD-10-CM | POA: Diagnosis not present

## 2019-09-15 DIAGNOSIS — Z87891 Personal history of nicotine dependence: Secondary | ICD-10-CM | POA: Diagnosis not present

## 2019-09-15 DIAGNOSIS — I69351 Hemiplegia and hemiparesis following cerebral infarction affecting right dominant side: Secondary | ICD-10-CM | POA: Diagnosis not present

## 2019-09-15 DIAGNOSIS — N185 Chronic kidney disease, stage 5: Secondary | ICD-10-CM | POA: Diagnosis not present

## 2019-09-15 DIAGNOSIS — I1311 Hypertensive heart and chronic kidney disease without heart failure, with stage 5 chronic kidney disease, or end stage renal disease: Secondary | ICD-10-CM | POA: Diagnosis not present

## 2019-09-15 DIAGNOSIS — J449 Chronic obstructive pulmonary disease, unspecified: Secondary | ICD-10-CM | POA: Diagnosis not present

## 2019-09-15 DIAGNOSIS — F0150 Vascular dementia without behavioral disturbance: Secondary | ICD-10-CM | POA: Diagnosis not present

## 2019-09-18 DIAGNOSIS — I1311 Hypertensive heart and chronic kidney disease without heart failure, with stage 5 chronic kidney disease, or end stage renal disease: Secondary | ICD-10-CM | POA: Diagnosis not present

## 2019-09-18 DIAGNOSIS — J449 Chronic obstructive pulmonary disease, unspecified: Secondary | ICD-10-CM | POA: Diagnosis not present

## 2019-09-18 DIAGNOSIS — N185 Chronic kidney disease, stage 5: Secondary | ICD-10-CM | POA: Diagnosis not present

## 2019-09-18 DIAGNOSIS — F0150 Vascular dementia without behavioral disturbance: Secondary | ICD-10-CM | POA: Diagnosis not present

## 2019-09-18 DIAGNOSIS — I69351 Hemiplegia and hemiparesis following cerebral infarction affecting right dominant side: Secondary | ICD-10-CM | POA: Diagnosis not present

## 2019-09-18 DIAGNOSIS — Z87891 Personal history of nicotine dependence: Secondary | ICD-10-CM | POA: Diagnosis not present

## 2019-09-20 DIAGNOSIS — N185 Chronic kidney disease, stage 5: Secondary | ICD-10-CM | POA: Diagnosis not present

## 2019-09-20 DIAGNOSIS — F0150 Vascular dementia without behavioral disturbance: Secondary | ICD-10-CM | POA: Diagnosis not present

## 2019-09-20 DIAGNOSIS — Z87891 Personal history of nicotine dependence: Secondary | ICD-10-CM | POA: Diagnosis not present

## 2019-09-20 DIAGNOSIS — I1311 Hypertensive heart and chronic kidney disease without heart failure, with stage 5 chronic kidney disease, or end stage renal disease: Secondary | ICD-10-CM | POA: Diagnosis not present

## 2019-09-20 DIAGNOSIS — I69351 Hemiplegia and hemiparesis following cerebral infarction affecting right dominant side: Secondary | ICD-10-CM | POA: Diagnosis not present

## 2019-09-20 DIAGNOSIS — J449 Chronic obstructive pulmonary disease, unspecified: Secondary | ICD-10-CM | POA: Diagnosis not present

## 2019-09-22 DIAGNOSIS — Z87891 Personal history of nicotine dependence: Secondary | ICD-10-CM | POA: Diagnosis not present

## 2019-09-22 DIAGNOSIS — I1311 Hypertensive heart and chronic kidney disease without heart failure, with stage 5 chronic kidney disease, or end stage renal disease: Secondary | ICD-10-CM | POA: Diagnosis not present

## 2019-09-22 DIAGNOSIS — I69351 Hemiplegia and hemiparesis following cerebral infarction affecting right dominant side: Secondary | ICD-10-CM | POA: Diagnosis not present

## 2019-09-22 DIAGNOSIS — J449 Chronic obstructive pulmonary disease, unspecified: Secondary | ICD-10-CM | POA: Diagnosis not present

## 2019-09-22 DIAGNOSIS — F0150 Vascular dementia without behavioral disturbance: Secondary | ICD-10-CM | POA: Diagnosis not present

## 2019-09-22 DIAGNOSIS — N185 Chronic kidney disease, stage 5: Secondary | ICD-10-CM | POA: Diagnosis not present

## 2019-09-25 DIAGNOSIS — N185 Chronic kidney disease, stage 5: Secondary | ICD-10-CM | POA: Diagnosis not present

## 2019-09-25 DIAGNOSIS — J449 Chronic obstructive pulmonary disease, unspecified: Secondary | ICD-10-CM | POA: Diagnosis not present

## 2019-09-25 DIAGNOSIS — F0150 Vascular dementia without behavioral disturbance: Secondary | ICD-10-CM | POA: Diagnosis not present

## 2019-09-25 DIAGNOSIS — Z87891 Personal history of nicotine dependence: Secondary | ICD-10-CM | POA: Diagnosis not present

## 2019-09-25 DIAGNOSIS — I69351 Hemiplegia and hemiparesis following cerebral infarction affecting right dominant side: Secondary | ICD-10-CM | POA: Diagnosis not present

## 2019-09-25 DIAGNOSIS — I1311 Hypertensive heart and chronic kidney disease without heart failure, with stage 5 chronic kidney disease, or end stage renal disease: Secondary | ICD-10-CM | POA: Diagnosis not present

## 2019-09-27 DIAGNOSIS — I69351 Hemiplegia and hemiparesis following cerebral infarction affecting right dominant side: Secondary | ICD-10-CM | POA: Diagnosis not present

## 2019-09-27 DIAGNOSIS — Z87891 Personal history of nicotine dependence: Secondary | ICD-10-CM | POA: Diagnosis not present

## 2019-09-27 DIAGNOSIS — I1311 Hypertensive heart and chronic kidney disease without heart failure, with stage 5 chronic kidney disease, or end stage renal disease: Secondary | ICD-10-CM | POA: Diagnosis not present

## 2019-09-27 DIAGNOSIS — F0150 Vascular dementia without behavioral disturbance: Secondary | ICD-10-CM | POA: Diagnosis not present

## 2019-09-27 DIAGNOSIS — J449 Chronic obstructive pulmonary disease, unspecified: Secondary | ICD-10-CM | POA: Diagnosis not present

## 2019-09-27 DIAGNOSIS — N185 Chronic kidney disease, stage 5: Secondary | ICD-10-CM | POA: Diagnosis not present

## 2019-09-28 DIAGNOSIS — R1310 Dysphagia, unspecified: Secondary | ICD-10-CM | POA: Diagnosis not present

## 2019-09-28 DIAGNOSIS — N185 Chronic kidney disease, stage 5: Secondary | ICD-10-CM | POA: Diagnosis not present

## 2019-09-28 DIAGNOSIS — Z87891 Personal history of nicotine dependence: Secondary | ICD-10-CM | POA: Diagnosis not present

## 2019-09-28 DIAGNOSIS — I1311 Hypertensive heart and chronic kidney disease without heart failure, with stage 5 chronic kidney disease, or end stage renal disease: Secondary | ICD-10-CM | POA: Diagnosis not present

## 2019-09-28 DIAGNOSIS — K219 Gastro-esophageal reflux disease without esophagitis: Secondary | ICD-10-CM | POA: Diagnosis not present

## 2019-09-28 DIAGNOSIS — F0150 Vascular dementia without behavioral disturbance: Secondary | ICD-10-CM | POA: Diagnosis not present

## 2019-09-28 DIAGNOSIS — J449 Chronic obstructive pulmonary disease, unspecified: Secondary | ICD-10-CM | POA: Diagnosis not present

## 2019-09-28 DIAGNOSIS — I69351 Hemiplegia and hemiparesis following cerebral infarction affecting right dominant side: Secondary | ICD-10-CM | POA: Diagnosis not present

## 2019-09-28 DIAGNOSIS — R296 Repeated falls: Secondary | ICD-10-CM | POA: Diagnosis not present

## 2019-09-28 DIAGNOSIS — Z741 Need for assistance with personal care: Secondary | ICD-10-CM | POA: Diagnosis not present

## 2019-09-28 DIAGNOSIS — R634 Abnormal weight loss: Secondary | ICD-10-CM | POA: Diagnosis not present

## 2019-10-05 DIAGNOSIS — N185 Chronic kidney disease, stage 5: Secondary | ICD-10-CM | POA: Diagnosis not present

## 2019-10-05 DIAGNOSIS — I1311 Hypertensive heart and chronic kidney disease without heart failure, with stage 5 chronic kidney disease, or end stage renal disease: Secondary | ICD-10-CM | POA: Diagnosis not present

## 2019-10-05 DIAGNOSIS — I69351 Hemiplegia and hemiparesis following cerebral infarction affecting right dominant side: Secondary | ICD-10-CM | POA: Diagnosis not present

## 2019-10-05 DIAGNOSIS — F0150 Vascular dementia without behavioral disturbance: Secondary | ICD-10-CM | POA: Diagnosis not present

## 2019-10-05 DIAGNOSIS — R296 Repeated falls: Secondary | ICD-10-CM | POA: Diagnosis not present

## 2019-10-05 DIAGNOSIS — J449 Chronic obstructive pulmonary disease, unspecified: Secondary | ICD-10-CM | POA: Diagnosis not present

## 2019-10-06 DIAGNOSIS — N185 Chronic kidney disease, stage 5: Secondary | ICD-10-CM | POA: Diagnosis not present

## 2019-10-06 DIAGNOSIS — I1311 Hypertensive heart and chronic kidney disease without heart failure, with stage 5 chronic kidney disease, or end stage renal disease: Secondary | ICD-10-CM | POA: Diagnosis not present

## 2019-10-06 DIAGNOSIS — J449 Chronic obstructive pulmonary disease, unspecified: Secondary | ICD-10-CM | POA: Diagnosis not present

## 2019-10-06 DIAGNOSIS — R296 Repeated falls: Secondary | ICD-10-CM | POA: Diagnosis not present

## 2019-10-06 DIAGNOSIS — I69351 Hemiplegia and hemiparesis following cerebral infarction affecting right dominant side: Secondary | ICD-10-CM | POA: Diagnosis not present

## 2019-10-06 DIAGNOSIS — F0150 Vascular dementia without behavioral disturbance: Secondary | ICD-10-CM | POA: Diagnosis not present

## 2019-10-09 DIAGNOSIS — F0150 Vascular dementia without behavioral disturbance: Secondary | ICD-10-CM | POA: Diagnosis not present

## 2019-10-09 DIAGNOSIS — R296 Repeated falls: Secondary | ICD-10-CM | POA: Diagnosis not present

## 2019-10-09 DIAGNOSIS — J449 Chronic obstructive pulmonary disease, unspecified: Secondary | ICD-10-CM | POA: Diagnosis not present

## 2019-10-09 DIAGNOSIS — I69351 Hemiplegia and hemiparesis following cerebral infarction affecting right dominant side: Secondary | ICD-10-CM | POA: Diagnosis not present

## 2019-10-09 DIAGNOSIS — I1311 Hypertensive heart and chronic kidney disease without heart failure, with stage 5 chronic kidney disease, or end stage renal disease: Secondary | ICD-10-CM | POA: Diagnosis not present

## 2019-10-09 DIAGNOSIS — N185 Chronic kidney disease, stage 5: Secondary | ICD-10-CM | POA: Diagnosis not present

## 2019-10-12 DIAGNOSIS — I1311 Hypertensive heart and chronic kidney disease without heart failure, with stage 5 chronic kidney disease, or end stage renal disease: Secondary | ICD-10-CM | POA: Diagnosis not present

## 2019-10-12 DIAGNOSIS — R296 Repeated falls: Secondary | ICD-10-CM | POA: Diagnosis not present

## 2019-10-12 DIAGNOSIS — J449 Chronic obstructive pulmonary disease, unspecified: Secondary | ICD-10-CM | POA: Diagnosis not present

## 2019-10-12 DIAGNOSIS — F0150 Vascular dementia without behavioral disturbance: Secondary | ICD-10-CM | POA: Diagnosis not present

## 2019-10-12 DIAGNOSIS — I69351 Hemiplegia and hemiparesis following cerebral infarction affecting right dominant side: Secondary | ICD-10-CM | POA: Diagnosis not present

## 2019-10-12 DIAGNOSIS — N185 Chronic kidney disease, stage 5: Secondary | ICD-10-CM | POA: Diagnosis not present

## 2019-10-12 DIAGNOSIS — Z03818 Encounter for observation for suspected exposure to other biological agents ruled out: Secondary | ICD-10-CM | POA: Diagnosis not present

## 2019-10-18 DIAGNOSIS — Z03818 Encounter for observation for suspected exposure to other biological agents ruled out: Secondary | ICD-10-CM | POA: Diagnosis not present

## 2019-10-19 DIAGNOSIS — N185 Chronic kidney disease, stage 5: Secondary | ICD-10-CM | POA: Diagnosis not present

## 2019-10-19 DIAGNOSIS — I1311 Hypertensive heart and chronic kidney disease without heart failure, with stage 5 chronic kidney disease, or end stage renal disease: Secondary | ICD-10-CM | POA: Diagnosis not present

## 2019-10-19 DIAGNOSIS — I69351 Hemiplegia and hemiparesis following cerebral infarction affecting right dominant side: Secondary | ICD-10-CM | POA: Diagnosis not present

## 2019-10-19 DIAGNOSIS — J449 Chronic obstructive pulmonary disease, unspecified: Secondary | ICD-10-CM | POA: Diagnosis not present

## 2019-10-19 DIAGNOSIS — F0150 Vascular dementia without behavioral disturbance: Secondary | ICD-10-CM | POA: Diagnosis not present

## 2019-10-19 DIAGNOSIS — R296 Repeated falls: Secondary | ICD-10-CM | POA: Diagnosis not present

## 2019-10-20 DIAGNOSIS — J449 Chronic obstructive pulmonary disease, unspecified: Secondary | ICD-10-CM | POA: Diagnosis not present

## 2019-10-20 DIAGNOSIS — N185 Chronic kidney disease, stage 5: Secondary | ICD-10-CM | POA: Diagnosis not present

## 2019-10-20 DIAGNOSIS — I69351 Hemiplegia and hemiparesis following cerebral infarction affecting right dominant side: Secondary | ICD-10-CM | POA: Diagnosis not present

## 2019-10-20 DIAGNOSIS — R296 Repeated falls: Secondary | ICD-10-CM | POA: Diagnosis not present

## 2019-10-20 DIAGNOSIS — I1311 Hypertensive heart and chronic kidney disease without heart failure, with stage 5 chronic kidney disease, or end stage renal disease: Secondary | ICD-10-CM | POA: Diagnosis not present

## 2019-10-20 DIAGNOSIS — F0150 Vascular dementia without behavioral disturbance: Secondary | ICD-10-CM | POA: Diagnosis not present

## 2019-10-21 DIAGNOSIS — F0150 Vascular dementia without behavioral disturbance: Secondary | ICD-10-CM | POA: Diagnosis not present

## 2019-10-21 DIAGNOSIS — I69351 Hemiplegia and hemiparesis following cerebral infarction affecting right dominant side: Secondary | ICD-10-CM | POA: Diagnosis not present

## 2019-10-21 DIAGNOSIS — R296 Repeated falls: Secondary | ICD-10-CM | POA: Diagnosis not present

## 2019-10-21 DIAGNOSIS — N185 Chronic kidney disease, stage 5: Secondary | ICD-10-CM | POA: Diagnosis not present

## 2019-10-21 DIAGNOSIS — J449 Chronic obstructive pulmonary disease, unspecified: Secondary | ICD-10-CM | POA: Diagnosis not present

## 2019-10-21 DIAGNOSIS — I1311 Hypertensive heart and chronic kidney disease without heart failure, with stage 5 chronic kidney disease, or end stage renal disease: Secondary | ICD-10-CM | POA: Diagnosis not present

## 2019-10-23 DIAGNOSIS — F0150 Vascular dementia without behavioral disturbance: Secondary | ICD-10-CM | POA: Diagnosis not present

## 2019-10-23 DIAGNOSIS — I1311 Hypertensive heart and chronic kidney disease without heart failure, with stage 5 chronic kidney disease, or end stage renal disease: Secondary | ICD-10-CM | POA: Diagnosis not present

## 2019-10-23 DIAGNOSIS — N185 Chronic kidney disease, stage 5: Secondary | ICD-10-CM | POA: Diagnosis not present

## 2019-10-23 DIAGNOSIS — I69351 Hemiplegia and hemiparesis following cerebral infarction affecting right dominant side: Secondary | ICD-10-CM | POA: Diagnosis not present

## 2019-10-23 DIAGNOSIS — R296 Repeated falls: Secondary | ICD-10-CM | POA: Diagnosis not present

## 2019-10-23 DIAGNOSIS — J449 Chronic obstructive pulmonary disease, unspecified: Secondary | ICD-10-CM | POA: Diagnosis not present

## 2019-10-24 DIAGNOSIS — N185 Chronic kidney disease, stage 5: Secondary | ICD-10-CM | POA: Diagnosis not present

## 2019-10-24 DIAGNOSIS — J449 Chronic obstructive pulmonary disease, unspecified: Secondary | ICD-10-CM | POA: Diagnosis not present

## 2019-10-24 DIAGNOSIS — I1311 Hypertensive heart and chronic kidney disease without heart failure, with stage 5 chronic kidney disease, or end stage renal disease: Secondary | ICD-10-CM | POA: Diagnosis not present

## 2019-10-24 DIAGNOSIS — F0150 Vascular dementia without behavioral disturbance: Secondary | ICD-10-CM | POA: Diagnosis not present

## 2019-10-24 DIAGNOSIS — I69351 Hemiplegia and hemiparesis following cerebral infarction affecting right dominant side: Secondary | ICD-10-CM | POA: Diagnosis not present

## 2019-10-24 DIAGNOSIS — R296 Repeated falls: Secondary | ICD-10-CM | POA: Diagnosis not present

## 2019-10-25 DIAGNOSIS — N185 Chronic kidney disease, stage 5: Secondary | ICD-10-CM | POA: Diagnosis not present

## 2019-10-25 DIAGNOSIS — F0150 Vascular dementia without behavioral disturbance: Secondary | ICD-10-CM | POA: Diagnosis not present

## 2019-10-25 DIAGNOSIS — I69351 Hemiplegia and hemiparesis following cerebral infarction affecting right dominant side: Secondary | ICD-10-CM | POA: Diagnosis not present

## 2019-10-25 DIAGNOSIS — I1311 Hypertensive heart and chronic kidney disease without heart failure, with stage 5 chronic kidney disease, or end stage renal disease: Secondary | ICD-10-CM | POA: Diagnosis not present

## 2019-10-25 DIAGNOSIS — J449 Chronic obstructive pulmonary disease, unspecified: Secondary | ICD-10-CM | POA: Diagnosis not present

## 2019-10-25 DIAGNOSIS — Z03818 Encounter for observation for suspected exposure to other biological agents ruled out: Secondary | ICD-10-CM | POA: Diagnosis not present

## 2019-10-25 DIAGNOSIS — R296 Repeated falls: Secondary | ICD-10-CM | POA: Diagnosis not present

## 2019-10-27 ENCOUNTER — Encounter: Payer: Self-pay | Admitting: Emergency Medicine

## 2019-10-27 ENCOUNTER — Inpatient Hospital Stay
Admission: EM | Admit: 2019-10-27 | Discharge: 2019-11-28 | DRG: 189 | Disposition: E | Attending: Internal Medicine | Admitting: Internal Medicine

## 2019-10-27 ENCOUNTER — Emergency Department

## 2019-10-27 ENCOUNTER — Other Ambulatory Visit: Payer: Self-pay

## 2019-10-27 DIAGNOSIS — J9601 Acute respiratory failure with hypoxia: Principal | ICD-10-CM | POA: Diagnosis present

## 2019-10-27 DIAGNOSIS — E870 Hyperosmolality and hypernatremia: Secondary | ICD-10-CM | POA: Diagnosis present

## 2019-10-27 DIAGNOSIS — F0151 Vascular dementia with behavioral disturbance: Secondary | ICD-10-CM | POA: Diagnosis present

## 2019-10-27 DIAGNOSIS — Z22322 Carrier or suspected carrier of Methicillin resistant Staphylococcus aureus: Secondary | ICD-10-CM

## 2019-10-27 DIAGNOSIS — F0150 Vascular dementia without behavioral disturbance: Secondary | ICD-10-CM | POA: Diagnosis not present

## 2019-10-27 DIAGNOSIS — E860 Dehydration: Secondary | ICD-10-CM | POA: Diagnosis present

## 2019-10-27 DIAGNOSIS — R778 Other specified abnormalities of plasma proteins: Secondary | ICD-10-CM | POA: Diagnosis present

## 2019-10-27 DIAGNOSIS — Z66 Do not resuscitate: Secondary | ICD-10-CM | POA: Diagnosis present

## 2019-10-27 DIAGNOSIS — I69320 Aphasia following cerebral infarction: Secondary | ICD-10-CM

## 2019-10-27 DIAGNOSIS — J189 Pneumonia, unspecified organism: Secondary | ICD-10-CM | POA: Diagnosis not present

## 2019-10-27 DIAGNOSIS — R4182 Altered mental status, unspecified: Secondary | ICD-10-CM | POA: Diagnosis not present

## 2019-10-27 DIAGNOSIS — J449 Chronic obstructive pulmonary disease, unspecified: Secondary | ICD-10-CM | POA: Diagnosis not present

## 2019-10-27 DIAGNOSIS — I129 Hypertensive chronic kidney disease with stage 1 through stage 4 chronic kidney disease, or unspecified chronic kidney disease: Secondary | ICD-10-CM | POA: Diagnosis present

## 2019-10-27 DIAGNOSIS — S3993XA Unspecified injury of pelvis, initial encounter: Secondary | ICD-10-CM | POA: Diagnosis not present

## 2019-10-27 DIAGNOSIS — R079 Chest pain, unspecified: Secondary | ICD-10-CM | POA: Diagnosis not present

## 2019-10-27 DIAGNOSIS — R7989 Other specified abnormal findings of blood chemistry: Secondary | ICD-10-CM | POA: Diagnosis not present

## 2019-10-27 DIAGNOSIS — R296 Repeated falls: Secondary | ICD-10-CM | POA: Diagnosis not present

## 2019-10-27 DIAGNOSIS — D72829 Elevated white blood cell count, unspecified: Secondary | ICD-10-CM

## 2019-10-27 DIAGNOSIS — G9341 Metabolic encephalopathy: Secondary | ICD-10-CM | POA: Diagnosis present

## 2019-10-27 DIAGNOSIS — R5381 Other malaise: Secondary | ICD-10-CM | POA: Diagnosis not present

## 2019-10-27 DIAGNOSIS — N185 Chronic kidney disease, stage 5: Secondary | ICD-10-CM | POA: Diagnosis not present

## 2019-10-27 DIAGNOSIS — A419 Sepsis, unspecified organism: Secondary | ICD-10-CM | POA: Diagnosis present

## 2019-10-27 DIAGNOSIS — Z823 Family history of stroke: Secondary | ICD-10-CM

## 2019-10-27 DIAGNOSIS — I1311 Hypertensive heart and chronic kidney disease without heart failure, with stage 5 chronic kidney disease, or end stage renal disease: Secondary | ICD-10-CM | POA: Diagnosis not present

## 2019-10-27 DIAGNOSIS — N179 Acute kidney failure, unspecified: Secondary | ICD-10-CM | POA: Diagnosis present

## 2019-10-27 DIAGNOSIS — I69351 Hemiplegia and hemiparesis following cerebral infarction affecting right dominant side: Secondary | ICD-10-CM | POA: Diagnosis not present

## 2019-10-27 DIAGNOSIS — R0602 Shortness of breath: Secondary | ICD-10-CM

## 2019-10-27 DIAGNOSIS — R001 Bradycardia, unspecified: Secondary | ICD-10-CM | POA: Diagnosis not present

## 2019-10-27 DIAGNOSIS — Z79899 Other long term (current) drug therapy: Secondary | ICD-10-CM

## 2019-10-27 DIAGNOSIS — K219 Gastro-esophageal reflux disease without esophagitis: Secondary | ICD-10-CM | POA: Diagnosis present

## 2019-10-27 DIAGNOSIS — F1190 Opioid use, unspecified, uncomplicated: Secondary | ICD-10-CM | POA: Diagnosis present

## 2019-10-27 DIAGNOSIS — S299XXA Unspecified injury of thorax, initial encounter: Secondary | ICD-10-CM | POA: Diagnosis not present

## 2019-10-27 DIAGNOSIS — E785 Hyperlipidemia, unspecified: Secondary | ICD-10-CM | POA: Diagnosis present

## 2019-10-27 DIAGNOSIS — N184 Chronic kidney disease, stage 4 (severe): Secondary | ICD-10-CM | POA: Diagnosis present

## 2019-10-27 DIAGNOSIS — Z87891 Personal history of nicotine dependence: Secondary | ICD-10-CM

## 2019-10-27 DIAGNOSIS — Z20828 Contact with and (suspected) exposure to other viral communicable diseases: Secondary | ICD-10-CM | POA: Diagnosis present

## 2019-10-27 DIAGNOSIS — Z7902 Long term (current) use of antithrombotics/antiplatelets: Secondary | ICD-10-CM

## 2019-10-27 DIAGNOSIS — F29 Unspecified psychosis not due to a substance or known physiological condition: Secondary | ICD-10-CM | POA: Diagnosis not present

## 2019-10-27 NOTE — ED Notes (Signed)
Wife at bedside reports multiple falls since arriving at [facility] and falls was the reason wife took pt to SNF.  Same reports injury to right side from falls and that pt "will not be going back there"

## 2019-10-27 NOTE — ED Triage Notes (Signed)
Patient presents to Emergency Department via [facility] from [place] with complaints of --- per wife pt "is whispering and usually is yelling" -- concern for stroke.    PT is hospice patient (that's who called 911 after talking to wife) and DNR at bedside  History of stroke with right side weakness and swallow precautions.  Pt unable to verbalize.  Pt's wife at bedside att: reports the pt was seen last speaking clearly on 18 Oct 2019.

## 2019-10-27 NOTE — ED Notes (Signed)
Patient transported to CT 

## 2019-10-27 NOTE — ED Provider Notes (Signed)
[facility] Emergency Department Provider Note   ____________________________________________   First MD Initiated Contact with Patient 10/11/2019 2317     (approximate)  I have reviewed the triage vital signs and the nursing notes.   HISTORY  Chief Complaint Altered Mental Status  Level V caveat: Limited by altered mentation History obtained by spouse  HPI Glenn [name] is a 78 y.o. male brought to the ED from [facility] for altered mental status.  Wife states he was placed there almost 2 weeks ago because he was having frequent falls at home, worsening dementia, stage V kidney disease.  History of CVA x2.  Reportedly staff told wife that patient has had altered mentation x3 days.  Wife tried to speak to the patient on the telephone today and noticed garbled and confused speech and demanded that they transport him to the ED for evaluation.  Although patient is a hospice patient with a DNR, wife is very angry with the SNF for not transporting him sooner during the week and demands a full evaluation for CVA.  Tells me he is not going back to that facility.  Reportedly patient had to be on oxygen during the week which he usually is not.  Also wife states facility took him off his methadone.  He last had a dose about 6 days ago.  To the wife's knowledge, patient has not had fever, abdominal pain, vomiting or diarrhea.  She is wanting "x-rays to make sure nothing is broken" because she is distrustful of the facility and unsure if he has fallen.        Past Medical History:  Diagnosis Date   Back pain    Chronic kidney disease    stage 4    COPD (chronic obstructive pulmonary disease) (HCC)    Dementia (HCC)    GERD (gastroesophageal reflux disease)    HTN (hypertension)    Hyperlipidemia    Hypogonadism in male    Memory changes    Stroke ([place])    2001 no residual symptom, 2013 no residual symptom   Tobacco use     Patient  Active Problem List   Diagnosis Date Noted   Sepsis ([place]) 10/28/2019   Vascular dementia with behavioral disturbance ([place]) 08/12/2017   Excessive daytime sleepiness 08/09/2015   CVA (cerebral vascular accident) ([place]) 08/09/2015   COPD exacerbation ([place]) 06/10/2015   Hemiparesis affecting right side as late effect of cerebrovascular accident ([place]) 06/10/2015   Opioid dependence with opioid-induced sleep disorder ([place]) 06/10/2015   SOB (shortness of breath) 04/11/2015   Impairment of visual perception 12/19/2013   Lack of coordination 12/19/2013   Muscle weakness (generalized) 12/19/2013   Aphasia following cerebrovascular disease 12/11/2013   Acute ischemic stroke ([place]) 11/03/2013   Acute renal failure ([place]) 11/03/2013   COPD (chronic obstructive pulmonary disease) ([place]) 08/06/2012   Tobacco use 08/06/2012   Hyperlipidemia 08/06/2012   CVA (cerebral infarction) 08/05/2012   HTN (hypertension) 08/05/2012   Memory loss 11/30/2011   CONSTIPATION 05/14/2009   CHANGE IN BOWELS 05/14/2009   COLONIC POLYPS, HX OF 05/14/2009   SHOULDER, ARTHRITIS, DEGEN./OSTEO 03/14/2008   SHOULDER PAIN 03/14/2008   IMPINGEMENT SYNDROME 03/14/2008   RUPTURE ROTATOR CUFF 03/14/2008    Past Surgical History:  Procedure Laterality Date   BACK SURGERY     4 surgeries   BACK SURGERY     CARPAL TUNNEL RELEASE Bilateral    2 surger   stroke      Prior  to Admission medications   Medication Sig Start Date End Date Taking? Authorizing Provider  acetaminophen (TYLENOL) 500 MG tablet Take 1,000 mg by mouth 3 (three) times daily.   Yes [provider]  ALPRAZolam Duanne Moron) 0.5 MG tablet Take 0.5 mg by mouth 2 (two) times daily.   Yes [provider]  amLODipine (NORVASC) 10 MG tablet Take 10 mg by mouth daily.   Yes [provider]  buPROPion (WELLBUTRIN SR) 150 MG 12 hr tablet Take 150 mg by mouth 2 (two) times daily.   Yes [provider]    carvedilol (COREG) 3.125 MG tablet Take 3.125 mg by mouth 2 (two) times daily with a meal.   Yes [provider]  clopidogrel (PLAVIX) 75 MG tablet Take 1 tablet (75 mg total) by mouth daily with breakfast. 11/04/13  Yes Short, Noah Delaine, MD  furosemide (LASIX) 40 MG tablet Take 40 mg by mouth daily.    Yes [provider]  guaiFENesin (ROBITUSSIN) 100 MG/5ML liquid Take 200 mg by mouth 3 (three) times daily as needed for congestion.   Yes [provider]  ipratropium-albuterol (DUONEB) 0.5-2.5 (3) MG/3ML SOLN Take 3 mLs by nebulization every 4 (four) hours as needed (wheezing/ shortness of breath).   Yes [provider]  Melatonin 3 MG TABS Take 3 mg by mouth at bedtime.   Yes [provider]  memantine (NAMENDA) 5 MG tablet Take 5 mg by mouth 2 (two) times daily.  08/08/18  Yes [provider]  mirtazapine (REMERON) 30 MG tablet Take 30 mg by mouth at bedtime.    Yes [provider]  omeprazole (PRILOSEC) 20 MG capsule Take 20 mg by mouth daily.    Yes [provider]  oxyCODONE (OXY IR/ROXICODONE) 5 MG immediate release tablet Take 2.5 mg by mouth daily as needed (for pain).    Yes [provider]  promethazine (PHENERGAN) 25 MG tablet Take 25 mg by mouth 2 (two) times daily as needed for nausea or vomiting.   Yes [provider]  QUEtiapine (SEROQUEL) 25 MG tablet Take 25 mg by mouth 2 (two) times daily.   Yes [provider]  sodium bicarbonate 650 MG tablet Take 650 mg by mouth 2 (two) times daily.    Yes [provider]  hydrochlorothiazide (HYDRODIURIL) 25 MG tablet Take 25 mg by mouth daily.    03/08/12  [provider]  lisinopril (PRINIVIL,ZESTRIL) 40 MG tablet Take 40 mg by mouth daily. 1/2 tab   03/08/12  [provider]  simvastatin (ZOCOR) 80 MG tablet Take 80 mg by mouth at bedtime. 1/2 tab daily   03/08/12  [provider]    Allergies Patient has no  known allergies.  Family History  Problem Relation Age of Onset   Other Father    High blood pressure Maternal Grandmother    High blood pressure Maternal Grandfather    Stroke Maternal Aunt    Stroke Maternal Uncle    Diabetes Neg Hx    Cancer Neg Hx     Social History Social History   Tobacco Use   Smoking status: Former Smoker    Packs/day: 1.50    Years: 50.00    Pack years: 75.00    Types: Cigarettes    Quit date: 12/13/2018    Years since quitting: 0.8   Smokeless tobacco: Never Used  Substance Use Topics   Alcohol use: No   Drug use: No    Types: Oxycodone  Review of Systems  Constitutional: No fever/chills Eyes: No visual changes. ENT: No sore throat. Cardiovascular: Denies chest pain. Respiratory: Denies shortness of breath. Gastrointestinal: No abdominal pain.  No nausea, no vomiting.  No diarrhea.  No constipation. Genitourinary: Negative for dysuria. Musculoskeletal: Negative for back pain. Skin: Negative for rash. Neurological: Positive for altered mentation.  Negative for headaches, focal weakness or numbness.   ____________________________________________   PHYSICAL EXAM:  VITAL SIGNS: ED Triage Vitals  Enc Vitals Group     BP 10/10/2019 2309 (!) 142/106     Pulse Rate 10/03/2019 2309 75     Resp 10/21/2019 2309 (!) 22     Temp 10/18/2019 2309 98.8 F (37.1 C)     Temp Source 10/28/2019 2309 Axillary     SpO2 09/29/2019 2309 100 %     Weight 10/23/2019 2310 140 lb (63.5 kg)     Height 10/17/2019 2310 5\' 10"  (1.778 m)     Head Circumference --      Peak Flow --      Pain Score --      Pain Loc --      Pain Edu? --      Excl. in [place]? --     Constitutional: Alert, responds to wife.  Elderly appearing and in mild acute distress.  Appears slightly agitated and moving around in the bed. Eyes: Conjunctivae are normal. PERRL. EOMI. Head: Atraumatic. Nose: No congestion/rhinnorhea. Mouth/Throat: Mucous membranes are mildly dry.  Oropharynx  non-erythematous. Neck: No stridor.   Cardiovascular: Normal rate, regular rhythm. Grossly normal heart sounds.  Good peripheral circulation. Respiratory: Increased respiratory effort.  No retractions. Lungs diminished bibasilarly. Gastrointestinal: Soft and nontender to light or deep palpation. No distention. No abdominal bruits. No CVA tenderness. Musculoskeletal: No lower extremity tenderness nor edema.  No joint effusions. Neurologic: Garbled speech and language.  Baseline right-sided weakness. MAEx4.  Skin:  Skin is warm, dry and intact. No rash noted. Psychiatric: Mood and affect are normal. Speech and behavior are normal.  ____________________________________________   LABS (all labs ordered are listed, but only abnormal results are displayed)  Labs Reviewed  CBC WITH DIFFERENTIAL/PLATELET - Abnormal; Notable for the following components:      Result Value   WBC 31.7 (*)    RBC 4.21 (*)    Hemoglobin 11.5 (*)    HCT 36.8 (*)    RDW 15.9 (*)    Neutro Abs 27.3 (*)    Monocytes Absolute 2.0 (*)    Abs Immature Granulocytes 0.72 (*)    All other components within normal limits  COMPREHENSIVE METABOLIC PANEL - Abnormal; Notable for the following components:   Sodium 160 (*)    Chloride 124 (*)    CO2 15 (*)    Glucose, Bld 105 (*)    BUN 165 (*)    Creatinine, Ser 12.52 (*)    Total Protein 8.2 (*)    Albumin 3.2 (*)    Alkaline Phosphatase 136 (*)    GFR calc non Af Amer 3 (*)    GFR calc Af Amer 4 (*)    Anion gap 21 (*)    All other components within normal limits  URINALYSIS, COMPLETE (UACMP) WITH MICROSCOPIC - Abnormal; Notable for the following components:   Color, Urine YELLOW (*)    APPearance TURBID (*)    Hgb urine dipstick MODERATE (*)    Protein, ur 100 (*)    Bacteria, UA RARE (*)    All other components within normal  limits  TROPONIN I (HIGH SENSITIVITY) - Abnormal; Notable for the following components:   Troponin I (High Sensitivity) 144 (*)    All  other components within normal limits  SARS CORONAVIRUS 2 BY RT PCR (HOSPITAL ORDER, [place] [facility])  URINE CULTURE  CULTURE, BLOOD (ROUTINE X 2)  CULTURE, BLOOD (ROUTINE X 2)  LACTIC ACID, PLASMA  LACTIC ACID, PLASMA  TROPONIN I (HIGH SENSITIVITY)   ____________________________________________  EKG  ED ECG REPORT I, Shaune Malacara J, the attending physician, personally viewed and interpreted this ECG.   Date: 10/28/2019  EKG Time: 0155  Rate: 109  Rhythm: sinus tachycardia  Axis: Normal  Intervals:nonspecific intraventricular conduction delay  ST&T Change: Nonspecific  ____________________________________________  RADIOLOGY  ED MD interpretation:  Unremarkable pelvis; No ICH; right lung base opacity  Official radiology report(s): Dg Pelvis 1-2 Views  Result Date: 10/28/2019 CLINICAL DATA:  Fall. EXAM: PELVIS - 1-2 VIEW COMPARISON:  None. FINDINGS: There is no evidence of pelvic fracture or diastasis. No pelvic bone lesions are seen. IMPRESSION: Negative. Electronically Signed   By: Constance Holster M.D.   On: 10/28/2019 01:00   Ct Head Wo Contrast  Result Date: 10/08/2019 CLINICAL DATA:  Altered mental status, concern for stroke EXAM: CT HEAD WITHOUT CONTRAST TECHNIQUE: Contiguous axial images were obtained from the base of the skull through the vertex without intravenous contrast. COMPARISON:  Radiograph August 16, 2018 FINDINGS: Brain: Stable appearance of regional gliosis in the left frontal lobe, the left parieto-occipital region, and in the left periventricular white matter. Additional lacunar type infarcts are noted in the right basal ganglia and thalamus. No convincing evidence of acute infarction, hemorrhage, hydrocephalus, extra-axial collection or mass lesion/mass effect. Symmetric prominence of the ventricles, cisterns and sulci compatible with parenchymal volume loss. Patchy areas of white matter hypoattenuation are most compatible with chronic  microvascular angiopathy. Vascular: Atherosclerotic calcification of the carotid siphons and intradural vertebral arteries. No hyperdense vessel. Skull: No calvarial fracture or suspicious osseous lesion. No scalp swelling or hematoma. Sinuses/Orbits: Paranasal sinuses and mastoid air cells are predominantly clear. Included orbital structures are unremarkable. Other: Absence of the maxillary dentition. IMPRESSION: 1. No acute intracranial abnormality. If there is persisting clinical concern for acute ischemia, MRI would be more sensitive and specific particularly given the background of remote regional gliosis throughout the left cerebral hemisphere and multiple remote lacunar infarcts in the right basal ganglia and thalamus. 2. Stable parenchymal volume loss and chronic microvascular ischemic white matter disease. 3. Intracranial atherosclerosis. Electronically Signed   By: Lovena Le M.D.   On: 10/06/2019 23:52   Dg Chest Port 1 View  Result Date: 10/28/2019 CLINICAL DATA:  Pain status post fall EXAM: PORTABLE CHEST 1 VIEW COMPARISON:  08/16/2018 FINDINGS: The heart size is stable from prior study. There are chronic bronchitic changes at the lung bases bilaterally with scarring versus atelectasis at the right lung base. There is no pneumothorax. The lungs appear somewhat hyperexpanded. IMPRESSION: 1. Persistent hazy airspace opacity at the right lung base. This may represent atelectasis, aspiration, or pneumonia. 2. Stable mild cardiac enlargement. Electronically Signed   By: Constance Holster M.D.   On: 10/28/2019 01:02    ____________________________________________   PROCEDURES  Procedure(s) performed (including Critical Care):  Procedures  CRITICAL CARE Performed by: Paulette Blanch   Total critical care time: 45 minutes  Critical care time was exclusive of separately billable procedures and treating other patients.  Critical care was necessary to treat or prevent imminent or  life-threatening deterioration.  Critical care was time spent personally by me on the following activities: development of treatment plan with patient and/or surrogate as well as nursing, discussions with consultants, evaluation of patient's response to treatment, examination of patient, obtaining history from patient or surrogate, ordering and performing treatments and interventions, ordering and review of laboratory studies, ordering and review of radiographic studies, pulse oximetry and re-evaluation of patient's condition. ____________________________________________   INITIAL IMPRESSION / ASSESSMENT AND PLAN / ED COURSE  As part of my medical decision making, I reviewed the following data within the [place] History obtained from family, Nursing notes reviewed and incorporated, Labs reviewed, EKG interpreted, Old chart reviewed, Radiograph reviewed, Discussed with admitting physician and Notes from prior ED visits     Glenn [name] was evaluated in Emergency Department on 10/28/2019 for the symptoms described in the history of present illness. He was evaluated in the context of the global COVID-19 pandemic, which necessitated consideration that the patient might be at risk for infection with the SARS-CoV-2 virus that causes COVID-19. Institutional protocols and algorithms that pertain to the evaluation of patients at risk for COVID-19 are in a state of rapid change based on information released by regulatory bodies including the CDC and federal and state organizations. These policies and algorithms were followed during the patient's care in the ED.    78 year old male sent from SNF at the request of his wife for evaluation of altered mentation. Differential diagnosis includes, but is not limited to, alcohol, illicit or prescription medications, or other toxic ingestion; intracranial pathology such as stroke or intracerebral hemorrhage; fever or infectious causes including  sepsis; hypoxemia and/or hypercarbia; uremia; trauma; endocrine related disorders such as diabetes, hypoglycemia, and thyroid-related diseases; hypertensive encephalopathy; etc.  We will obtain basic lab work, CT head.  Wife is understandably frustrated at the situation and particularly at the facility.  Patient does seem agitated.  Will obtain bladder scan in the event of urinary retention.  He may be in opioid withdrawal since the discontinuation of methadone 6 days ago.  Will give morphine and reassess.   Clinical Course as of Oct 27 445  Sat Oct 28, 2019  0049 Leukocytosis noted.  Will obtain blood cultures and lactic acid.  Nurse to in and out cath for urine.   [JS]  0225 Updated wife of all test results.  Will discuss case with hospitalist Dr. Marcille Blanco who evaluated patient in the emergency department for admission.   [JS]    Clinical Course User Index [JS] Paulette Blanch, MD     ____________________________________________   FINAL CLINICAL IMPRESSION(S) / ED DIAGNOSES  Final diagnoses:  Altered mental status, unspecified altered mental status type  Sepsis, due to unspecified organism, unspecified whether acute organ dysfunction present ([place])  HCAP (healthcare-associated pneumonia)  Leukocytosis, unspecified type  Elevated troponin     ED Discharge Orders    None       Note:  This document was prepared using Dragon voice recognition software and may include unintentional dictation errors.   Paulette Blanch, MD 10/28/19 [phone]

## 2019-10-28 ENCOUNTER — Emergency Department

## 2019-10-28 DIAGNOSIS — N184 Chronic kidney disease, stage 4 (severe): Secondary | ICD-10-CM | POA: Diagnosis present

## 2019-10-28 DIAGNOSIS — J9601 Acute respiratory failure with hypoxia: Secondary | ICD-10-CM | POA: Diagnosis present

## 2019-10-28 DIAGNOSIS — Z7902 Long term (current) use of antithrombotics/antiplatelets: Secondary | ICD-10-CM | POA: Diagnosis not present

## 2019-10-28 DIAGNOSIS — R079 Chest pain, unspecified: Secondary | ICD-10-CM | POA: Diagnosis not present

## 2019-10-28 DIAGNOSIS — F0151 Vascular dementia with behavioral disturbance: Secondary | ICD-10-CM | POA: Diagnosis present

## 2019-10-28 DIAGNOSIS — A419 Sepsis, unspecified organism: Secondary | ICD-10-CM | POA: Diagnosis present

## 2019-10-28 DIAGNOSIS — I69351 Hemiplegia and hemiparesis following cerebral infarction affecting right dominant side: Secondary | ICD-10-CM | POA: Diagnosis not present

## 2019-10-28 DIAGNOSIS — N185 Chronic kidney disease, stage 5: Secondary | ICD-10-CM | POA: Diagnosis not present

## 2019-10-28 DIAGNOSIS — Z823 Family history of stroke: Secondary | ICD-10-CM | POA: Diagnosis not present

## 2019-10-28 DIAGNOSIS — E870 Hyperosmolality and hypernatremia: Secondary | ICD-10-CM | POA: Diagnosis present

## 2019-10-28 DIAGNOSIS — Z22322 Carrier or suspected carrier of Methicillin resistant Staphylococcus aureus: Secondary | ICD-10-CM | POA: Diagnosis not present

## 2019-10-28 DIAGNOSIS — E860 Dehydration: Secondary | ICD-10-CM | POA: Diagnosis present

## 2019-10-28 DIAGNOSIS — Z741 Need for assistance with personal care: Secondary | ICD-10-CM | POA: Diagnosis not present

## 2019-10-28 DIAGNOSIS — R778 Other specified abnormalities of plasma proteins: Secondary | ICD-10-CM | POA: Diagnosis present

## 2019-10-28 DIAGNOSIS — R1310 Dysphagia, unspecified: Secondary | ICD-10-CM | POA: Diagnosis not present

## 2019-10-28 DIAGNOSIS — I1311 Hypertensive heart and chronic kidney disease without heart failure, with stage 5 chronic kidney disease, or end stage renal disease: Secondary | ICD-10-CM | POA: Diagnosis not present

## 2019-10-28 DIAGNOSIS — F0150 Vascular dementia without behavioral disturbance: Secondary | ICD-10-CM | POA: Diagnosis not present

## 2019-10-28 DIAGNOSIS — Z66 Do not resuscitate: Secondary | ICD-10-CM | POA: Diagnosis present

## 2019-10-28 DIAGNOSIS — N186 End stage renal disease: Secondary | ICD-10-CM | POA: Diagnosis not present

## 2019-10-28 DIAGNOSIS — S299XXA Unspecified injury of thorax, initial encounter: Secondary | ICD-10-CM | POA: Diagnosis not present

## 2019-10-28 DIAGNOSIS — S3993XA Unspecified injury of pelvis, initial encounter: Secondary | ICD-10-CM | POA: Diagnosis not present

## 2019-10-28 DIAGNOSIS — I2510 Atherosclerotic heart disease of native coronary artery without angina pectoris: Secondary | ICD-10-CM | POA: Diagnosis not present

## 2019-10-28 DIAGNOSIS — R4182 Altered mental status, unspecified: Secondary | ICD-10-CM | POA: Diagnosis not present

## 2019-10-28 DIAGNOSIS — J449 Chronic obstructive pulmonary disease, unspecified: Secondary | ICD-10-CM | POA: Diagnosis not present

## 2019-10-28 DIAGNOSIS — R0602 Shortness of breath: Secondary | ICD-10-CM | POA: Diagnosis present

## 2019-10-28 DIAGNOSIS — Z87891 Personal history of nicotine dependence: Secondary | ICD-10-CM | POA: Diagnosis not present

## 2019-10-28 DIAGNOSIS — G9341 Metabolic encephalopathy: Secondary | ICD-10-CM | POA: Diagnosis present

## 2019-10-28 DIAGNOSIS — Z79899 Other long term (current) drug therapy: Secondary | ICD-10-CM | POA: Diagnosis not present

## 2019-10-28 DIAGNOSIS — K219 Gastro-esophageal reflux disease without esophagitis: Secondary | ICD-10-CM | POA: Diagnosis not present

## 2019-10-28 DIAGNOSIS — J189 Pneumonia, unspecified organism: Secondary | ICD-10-CM | POA: Diagnosis not present

## 2019-10-28 DIAGNOSIS — R296 Repeated falls: Secondary | ICD-10-CM | POA: Diagnosis not present

## 2019-10-28 DIAGNOSIS — I129 Hypertensive chronic kidney disease with stage 1 through stage 4 chronic kidney disease, or unspecified chronic kidney disease: Secondary | ICD-10-CM | POA: Diagnosis present

## 2019-10-28 DIAGNOSIS — R634 Abnormal weight loss: Secondary | ICD-10-CM | POA: Diagnosis not present

## 2019-10-28 DIAGNOSIS — Z20828 Contact with and (suspected) exposure to other viral communicable diseases: Secondary | ICD-10-CM | POA: Diagnosis present

## 2019-10-28 DIAGNOSIS — I69320 Aphasia following cerebral infarction: Secondary | ICD-10-CM | POA: Diagnosis not present

## 2019-10-28 DIAGNOSIS — N179 Acute kidney failure, unspecified: Secondary | ICD-10-CM | POA: Diagnosis present

## 2019-10-28 DIAGNOSIS — R001 Bradycardia, unspecified: Secondary | ICD-10-CM | POA: Diagnosis not present

## 2019-10-28 DIAGNOSIS — F1190 Opioid use, unspecified, uncomplicated: Secondary | ICD-10-CM | POA: Diagnosis present

## 2019-10-28 DIAGNOSIS — E785 Hyperlipidemia, unspecified: Secondary | ICD-10-CM | POA: Diagnosis present

## 2019-10-28 LAB — CBC WITH DIFFERENTIAL/PLATELET
Abs Immature Granulocytes: 0.72 10*3/uL — ABNORMAL HIGH (ref 0.00–0.07)
Basophils Absolute: 0.1 10*3/uL (ref 0.0–0.1)
Basophils Relative: 0 %
Eosinophils Absolute: 0.2 10*3/uL (ref 0.0–0.5)
Eosinophils Relative: 1 %
HCT: 36.8 % — ABNORMAL LOW (ref 39.0–52.0)
Hemoglobin: 11.5 g/dL — ABNORMAL LOW (ref 13.0–17.0)
Immature Granulocytes: 2 %
Lymphocytes Relative: 5 %
Lymphs Abs: 1.6 10*3/uL (ref 0.7–4.0)
MCH: 27.3 pg (ref 26.0–34.0)
MCHC: 31.3 g/dL (ref 30.0–36.0)
MCV: 87.4 fL (ref 80.0–100.0)
Monocytes Absolute: 2 10*3/uL — ABNORMAL HIGH (ref 0.1–1.0)
Monocytes Relative: 6 %
Neutro Abs: 27.3 10*3/uL — ABNORMAL HIGH (ref 1.7–7.7)
Neutrophils Relative %: 86 %
Platelets: 288 10*3/uL (ref 150–400)
RBC: 4.21 MIL/uL — ABNORMAL LOW (ref 4.22–5.81)
RDW: 15.9 % — ABNORMAL HIGH (ref 11.5–15.5)
WBC: 31.7 10*3/uL — ABNORMAL HIGH (ref 4.0–10.5)
nRBC: 0 % (ref 0.0–0.2)

## 2019-10-28 LAB — TSH: TSH: 5.397 u[IU]/mL — ABNORMAL HIGH (ref 0.350–4.500)

## 2019-10-28 LAB — URINALYSIS, COMPLETE (UACMP) WITH MICROSCOPIC
Bilirubin Urine: NEGATIVE
Glucose, UA: NEGATIVE mg/dL
Ketones, ur: NEGATIVE mg/dL
Leukocytes,Ua: NEGATIVE
Nitrite: NEGATIVE
Protein, ur: 100 mg/dL — AB
Specific Gravity, Urine: 1.014 (ref 1.005–1.030)
pH: 5 (ref 5.0–8.0)

## 2019-10-28 LAB — COMPREHENSIVE METABOLIC PANEL
ALT: 41 U/L (ref 0–44)
AST: 41 U/L (ref 15–41)
Albumin: 3.2 g/dL — ABNORMAL LOW (ref 3.5–5.0)
Alkaline Phosphatase: 136 U/L — ABNORMAL HIGH (ref 38–126)
Anion gap: 21 — ABNORMAL HIGH (ref 5–15)
BUN: 165 mg/dL — ABNORMAL HIGH (ref 8–23)
CO2: 15 mmol/L — ABNORMAL LOW (ref 22–32)
Calcium: 8.9 mg/dL (ref 8.9–10.3)
Chloride: 124 mmol/L — ABNORMAL HIGH (ref 98–111)
Creatinine, Ser: 12.52 mg/dL — ABNORMAL HIGH (ref 0.61–1.24)
GFR calc Af Amer: 4 mL/min — ABNORMAL LOW (ref >60)
GFR calc non Af Amer: 3 mL/min — ABNORMAL LOW (ref >60)
Glucose, Bld: 105 mg/dL — ABNORMAL HIGH (ref 70–99)
Potassium: 4.9 mmol/L (ref 3.5–5.1)
Sodium: 160 mmol/L — ABNORMAL HIGH (ref 135–145)
Total Bilirubin: 0.5 mg/dL (ref 0.3–1.2)
Total Protein: 8.2 g/dL — ABNORMAL HIGH (ref 6.5–8.1)

## 2019-10-28 LAB — LACTIC ACID, PLASMA: Lactic Acid, Venous: 1.9 mmol/L (ref 0.5–1.9)

## 2019-10-28 LAB — SODIUM
Sodium: 159 mmol/L — ABNORMAL HIGH (ref 135–145)
Sodium: 158 mmol/L — ABNORMAL HIGH (ref 135–145)

## 2019-10-28 LAB — SARS CORONAVIRUS 2 BY RT PCR (HOSPITAL ORDER, PERFORMED IN ~~LOC~~ HOSPITAL LAB): SARS Coronavirus 2: NEGATIVE

## 2019-10-28 LAB — TROPONIN I (HIGH SENSITIVITY)
Troponin I (High Sensitivity): 144 ng/L (ref <18)
Troponin I (High Sensitivity): 135 ng/L (ref <18)

## 2019-10-28 MED ORDER — SODIUM CHLORIDE 0.9 % IV SOLN
2.0000 g | Freq: Once | INTRAVENOUS | Status: AC
  Administered 2019-10-28: 2 g via INTRAVENOUS
  Filled 2019-10-28: qty 2

## 2019-10-28 MED ORDER — OXYCODONE-ACETAMINOPHEN 5-325 MG PO TABS
1.0000 | ORAL_TABLET | Freq: Four times a day (QID) | ORAL | Status: DC | PRN
  Administered 2019-10-28: 1 via ORAL
  Filled 2019-10-28: qty 1

## 2019-10-28 MED ORDER — CARVEDILOL 3.125 MG PO TABS
3.1250 mg | ORAL_TABLET | Freq: Two times a day (BID) | ORAL | Status: DC
  Administered 2019-10-28: 3.125 mg via ORAL
  Filled 2019-10-28 (×2): qty 1

## 2019-10-28 MED ORDER — DEXTROSE-NACL 5-0.9 % IV SOLN
INTRAVENOUS | Status: DC
  Administered 2019-10-28 – 2019-10-29 (×2): via INTRAVENOUS

## 2019-10-28 MED ORDER — PANTOPRAZOLE SODIUM 40 MG PO TBEC
40.0000 mg | DELAYED_RELEASE_TABLET | Freq: Every day | ORAL | Status: DC
  Administered 2019-10-28: 40 mg via ORAL
  Filled 2019-10-28: qty 1

## 2019-10-28 MED ORDER — BUPROPION HCL ER (SR) 150 MG PO TB12
150.0000 mg | ORAL_TABLET | Freq: Two times a day (BID) | ORAL | Status: DC
  Filled 2019-10-28 (×2): qty 1

## 2019-10-28 MED ORDER — HEPARIN SODIUM (PORCINE) 5000 UNIT/ML IJ SOLN
5000.0000 [IU] | Freq: Three times a day (TID) | INTRAMUSCULAR | Status: DC
  Administered 2019-10-28 – 2019-10-29 (×3): 5000 [IU] via SUBCUTANEOUS
  Filled 2019-10-28 (×4): qty 1

## 2019-10-28 MED ORDER — ONDANSETRON HCL 4 MG PO TABS
4.0000 mg | ORAL_TABLET | Freq: Four times a day (QID) | ORAL | Status: DC | PRN
  Filled 2019-10-28: qty 1

## 2019-10-28 MED ORDER — VANCOMYCIN VARIABLE DOSE PER UNSTABLE RENAL FUNCTION (PHARMACIST DOSING): Status: DC

## 2019-10-28 MED ORDER — LORAZEPAM 2 MG/ML IJ SOLN
0.5000 mg | Freq: Two times a day (BID) | INTRAMUSCULAR | Status: DC
  Administered 2019-10-28 – 2019-10-29 (×3): 0.5 mg via INTRAVENOUS
  Filled 2019-10-28 (×3): qty 1

## 2019-10-28 MED ORDER — ALPRAZOLAM 0.5 MG PO TABS
0.5000 mg | ORAL_TABLET | Freq: Two times a day (BID) | ORAL | Status: DC
  Filled 2019-10-28: qty 1

## 2019-10-28 MED ORDER — DROPERIDOL 2.5 MG/ML IJ SOLN
1.2500 mg | Freq: Once | INTRAMUSCULAR | Status: AC
  Administered 2019-10-28: 1.25 mg via INTRAVENOUS
  Filled 2019-10-28: qty 2

## 2019-10-28 MED ORDER — ONDANSETRON HCL 4 MG/2ML IJ SOLN: 4.0000 mg | Freq: Four times a day (QID) | INTRAMUSCULAR | Status: DC | PRN

## 2019-10-28 MED ORDER — MELATONIN 5 MG PO TABS
2.5000 mg | ORAL_TABLET | Freq: Every day | ORAL | Status: DC
  Filled 2019-10-28: qty 0.5

## 2019-10-28 MED ORDER — SODIUM CHLORIDE 0.9 % IV BOLUS (SEPSIS): 1000.0000 mL | Freq: Once | INTRAVENOUS | Status: DC

## 2019-10-28 MED ORDER — FUROSEMIDE 40 MG PO TABS
40.0000 mg | ORAL_TABLET | Freq: Every day | ORAL | Status: DC
  Filled 2019-10-28 (×2): qty 1

## 2019-10-28 MED ORDER — HALOPERIDOL LACTATE 5 MG/ML IJ SOLN
2.0000 mg | Freq: Two times a day (BID) | INTRAMUSCULAR | Status: DC
  Administered 2019-10-28 – 2019-10-29 (×3): 2 mg via INTRAVENOUS
  Filled 2019-10-28 (×5): qty 1

## 2019-10-28 MED ORDER — IPRATROPIUM-ALBUTEROL 0.5-2.5 (3) MG/3ML IN SOLN: 3.0000 mL | RESPIRATORY_TRACT | Status: DC | PRN

## 2019-10-28 MED ORDER — SODIUM BICARBONATE 650 MG PO TABS
650.0000 mg | ORAL_TABLET | Freq: Two times a day (BID) | ORAL | Status: DC
  Filled 2019-10-28 (×2): qty 1

## 2019-10-28 MED ORDER — MEMANTINE HCL 5 MG PO TABS
5.0000 mg | ORAL_TABLET | Freq: Two times a day (BID) | ORAL | Status: DC
  Filled 2019-10-28 (×2): qty 1

## 2019-10-28 MED ORDER — MORPHINE SULFATE (PF) 4 MG/ML IV SOLN
4.0000 mg | Freq: Once | INTRAVENOUS | Status: AC
  Administered 2019-10-28: 4 mg via INTRAVENOUS
  Filled 2019-10-28: qty 1

## 2019-10-28 MED ORDER — SODIUM CHLORIDE 0.9 % IV SOLN
1.0000 g | INTRAVENOUS | Status: DC
  Administered 2019-10-29: 1 g via INTRAVENOUS
  Filled 2019-10-28 (×2): qty 1

## 2019-10-28 MED ORDER — SODIUM CHLORIDE 0.9 % IV BOLUS
1000.0000 mL | Freq: Once | INTRAVENOUS | Status: AC
  Administered 2019-10-28: 1000 mL via INTRAVENOUS

## 2019-10-28 MED ORDER — QUETIAPINE FUMARATE 25 MG PO TABS
25.0000 mg | ORAL_TABLET | Freq: Two times a day (BID) | ORAL | Status: DC
  Filled 2019-10-28 (×2): qty 1

## 2019-10-28 MED ORDER — VANCOMYCIN HCL IN DEXTROSE 1-5 GM/200ML-% IV SOLN
1000.0000 mg | Freq: Once | INTRAVENOUS | Status: AC
  Administered 2019-10-28: 1000 mg via INTRAVENOUS
  Filled 2019-10-28: qty 200

## 2019-10-28 MED ORDER — CLOPIDOGREL BISULFATE 75 MG PO TABS
75.0000 mg | ORAL_TABLET | Freq: Every day | ORAL | Status: DC
  Administered 2019-10-28: 75 mg via ORAL
  Filled 2019-10-28: qty 1

## 2019-10-28 MED ORDER — DOCUSATE SODIUM 100 MG PO CAPS
100.0000 mg | ORAL_CAPSULE | Freq: Two times a day (BID) | ORAL | Status: DC
  Filled 2019-10-28: qty 1

## 2019-10-28 MED ORDER — DIPHENHYDRAMINE HCL 25 MG PO CAPS: 25.0000 mg | ORAL_CAPSULE | Freq: Once | ORAL | Status: DC

## 2019-10-28 MED ORDER — GUAIFENESIN 100 MG/5ML PO SOLN
200.0000 mg | Freq: Three times a day (TID) | ORAL | Status: DC | PRN
  Filled 2019-10-28: qty 10

## 2019-10-28 MED ORDER — MORPHINE SULFATE (PF) 2 MG/ML IV SOLN
1.0000 mg | INTRAVENOUS | Status: DC | PRN
  Administered 2019-10-29: 1 mg via INTRAVENOUS
  Filled 2019-10-28: qty 1

## 2019-10-28 MED ORDER — ACETAMINOPHEN 650 MG RE SUPP
650.0000 mg | Freq: Four times a day (QID) | RECTAL | Status: DC | PRN
  Filled 2019-10-28: qty 1

## 2019-10-28 MED ORDER — HALOPERIDOL LACTATE 5 MG/ML IJ SOLN
2.0000 mg | Freq: Four times a day (QID) | INTRAMUSCULAR | Status: DC | PRN
  Administered 2019-10-28 – 2019-10-29 (×2): 2 mg via INTRAVENOUS
  Filled 2019-10-28 (×2): qty 0.4

## 2019-10-28 MED ORDER — DEXTROSE-NACL 5-0.45 % IV SOLN
INTRAVENOUS | Status: DC
  Administered 2019-10-28: 08:00:00 via INTRAVENOUS

## 2019-10-28 MED ORDER — SODIUM CHLORIDE 0.9 % IV BOLUS (SEPSIS)
1000.0000 mL | Freq: Once | INTRAVENOUS | Status: AC
  Administered 2019-10-28: 1000 mL via INTRAVENOUS

## 2019-10-28 MED ORDER — MIRTAZAPINE 15 MG PO TABS: 30.0000 mg | ORAL_TABLET | Freq: Every day | ORAL | Status: DC

## 2019-10-28 MED ORDER — AMLODIPINE BESYLATE 5 MG PO TABS
10.0000 mg | ORAL_TABLET | Freq: Every day | ORAL | Status: DC
  Administered 2019-10-28: 10 mg via ORAL
  Filled 2019-10-28: qty 2

## 2019-10-28 MED ORDER — ACETAMINOPHEN 325 MG PO TABS: 650.0000 mg | ORAL_TABLET | Freq: Four times a day (QID) | ORAL | Status: DC | PRN

## 2019-10-28 MED ORDER — DIPHENHYDRAMINE HCL 50 MG/ML IJ SOLN
25.0000 mg | Freq: Once | INTRAMUSCULAR | Status: AC
  Administered 2019-10-28: 25 mg via INTRAVENOUS
  Filled 2019-10-28: qty 1

## 2019-10-28 MED ORDER — LABETALOL HCL 5 MG/ML IV SOLN: 10.0000 mg | INTRAVENOUS | Status: DC | PRN

## 2019-10-28 NOTE — ED Notes (Signed)
Date and time results received: 10/28/19 0115 (use smartphrase ".now" to insert current time)  Test: Troponin Critical Value: 144  Name of Provider Notified: Dr. Beather Arbour   Orders Received? Or Actions Taken?: Actions Taken: Dr. Beather Arbour acknowledged

## 2019-10-28 NOTE — ED Notes (Signed)
Pt removed O2 from nose- wife states "his oxygen level was fine without it earlier"

## 2019-10-28 NOTE — Consult Note (Signed)
Pharmacy Antibiotic Note  Glenn [name] is a 78 y.o. male admitted on 10/04/2019 with pneumonia.  Pharmacy has been consulted for vancomycin and cefepime dosing. 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 He received 2 grams cefepime and 1 gram vancomycin in the ED this morning.   Plan: 1) vancomycin variable dose range used as input due to severe elevation in SCr. T1/2 72-96h  Scr in am  2) start cefepime 1 gram IV every 24 hours  Height: 5\' 10"  (177.8 cm) Weight: 140 lb (63.5 kg) IBW/kg (Calculated) : 73  Temp (24hrs), Avg:98.3 F (36.8 C), Min:97.8 F (36.6 C), Max:98.8 F (37.1 C)  Recent Labs  Lab 10/28/19 0005 10/28/19 0057  WBC 31.7*  --   CREATININE 12.52*  --   LATICACIDVEN  --  1.9    Estimated Creatinine Clearance: 4.4 mL/min (A) (by C-G formula based on SCr of 12.52 mg/dL (H)).    No Known Allergies  Antimicrobials this admission: vancomycin 10/31 >>  cefepime 10/31 >>   Microbiology results: 10/31 BCx: pending 10/31 UCx: pending  10/31 SARS CoV-2: negative  10/31 MRSA PCR: pending  Thank you for allowing pharmacy to be a part of this patient's care.  Dallie Piles, PharmD 10/28/2019 12:25 PM

## 2019-10-28 NOTE — ED Notes (Signed)
Chaplain at bedside with wife

## 2019-10-28 NOTE — ED Notes (Signed)
Secure message sent to admitting dr for pain medication

## 2019-10-28 NOTE — H&P (Signed)
Glenn [name] is an 78 y.o. male.   Chief Complaint: Altered mental status HPI: The patient with past medical history of chronic kidney disease, recent as well as remote stroke, hypertension and hyperlipidemia presents to the emergency department from his nursing home due to altered mental status.  The patient's wife reports that he has had a muted speaking voice for more than a week.  The patient also has not been eating well, per nursing home staff.  Upon admission to the nursing home 4 weeks ago the patient could converse in complete sentences and could not walk.  (He was actually admitted to the nursing home for increased falls at home).  His wife reports that he has had some undocumented falls in the nursing home and that now his mental status and respiratory status have worsened.  Chest x-ray in the emergency department revealed hazy opacity in the right lung base concerning for pneumonia.  The patient was started on broad-spectrum antibiotics after obtaining blood cultures.  The hospital service was then called for admission.  Past Medical History:  Diagnosis Date  . Back pain   . Chronic kidney disease    stage 4   . COPD (chronic obstructive pulmonary disease) ([place])   . Dementia ([place])   . GERD (gastroesophageal reflux disease)   . HTN (hypertension)   . Hyperlipidemia   . Hypogonadism in male   . Memory changes   . Stroke [facility])    2001 no residual symptom, 2013 no residual symptom  . Tobacco use     Past Surgical History:  Procedure Laterality Date  . BACK SURGERY     4 surgeries  . BACK SURGERY    . CARPAL TUNNEL RELEASE Bilateral    2 surger  . stroke      Family History  Problem Relation Age of Onset  . Other Father   . High blood pressure Maternal Grandmother   . High blood pressure Maternal Grandfather   . Stroke Maternal Aunt   . Stroke Maternal Uncle   . Diabetes Neg Hx   . Cancer Neg Hx    Social History:  reports that he quit smoking about 10 months ago.  His smoking use included cigarettes. He has a 75.00 pack-year smoking history. He has never used smokeless tobacco. He reports that he does not drink alcohol or use drugs.  Allergies: No Known Allergies  Prior to Admission medications   Medication Sig Start Date End Date Taking? Authorizing Provider  acetaminophen (TYLENOL) 500 MG tablet Take 1,000 mg by mouth 3 (three) times daily.   Yes [provider]  ALPRAZolam Duanne Moron) 0.5 MG tablet Take 0.5 mg by mouth 2 (two) times daily.   Yes [provider]  amLODipine (NORVASC) 10 MG tablet Take 10 mg by mouth daily.   Yes [provider]  buPROPion (WELLBUTRIN SR) 150 MG 12 hr tablet Take 150 mg by mouth 2 (two) times daily.   Yes [provider]  carvedilol (COREG) 3.125 MG tablet Take 3.125 mg by mouth 2 (two) times daily with a meal.   Yes [provider]  clopidogrel (PLAVIX) 75 MG tablet Take 1 tablet (75 mg total) by mouth daily with breakfast. 11/04/13  Yes Short, Noah Delaine, MD  furosemide (LASIX) 40 MG tablet Take 40 mg by mouth daily.    Yes [provider]  guaiFENesin (ROBITUSSIN) 100 MG/5ML liquid Take 200 mg by mouth 3 (three) times daily as needed for congestion.   Yes [provider]  ipratropium-albuterol (DUONEB) 0.5-2.5 (3) MG/3ML SOLN Take 3 mLs by nebulization every 4 (four) hours as needed (wheezing/ shortness of breath).   Yes [provider]  Melatonin 3 MG TABS Take 3 mg by mouth at bedtime.   Yes [provider]  memantine (NAMENDA) 5 MG tablet Take 5 mg by mouth 2 (two) times daily.  08/08/18  Yes [provider]  mirtazapine (REMERON) 30 MG tablet Take 30 mg by mouth at bedtime.    Yes [provider]  omeprazole (PRILOSEC) 20 MG capsule Take 20 mg by mouth daily.    Yes [provider]  oxyCODONE (OXY IR/ROXICODONE) 5 MG immediate release tablet Take 2.5 mg by mouth daily as needed (for pain).    Yes [provider]  promethazine (PHENERGAN) 25 MG tablet Take 25 mg by mouth 2 (two) times daily as needed for nausea or vomiting.   Yes [provider]  QUEtiapine (SEROQUEL) 25 MG tablet Take 25 mg by mouth 2 (two) times daily.   Yes [provider]  sodium bicarbonate 650 MG tablet Take 650 mg by mouth 2 (two) times daily.    Yes [provider]  hydrochlorothiazide (HYDRODIURIL) 25 MG tablet Take 25 mg by mouth daily.    03/08/12  [provider]  lisinopril (PRINIVIL,ZESTRIL) 40 MG tablet Take 40 mg by mouth daily. 1/2 tab   03/08/12  [provider]  simvastatin (ZOCOR) 80 MG tablet Take 80 mg by mouth at bedtime. 1/2 tab daily   03/08/12  [provider]     Results for orders placed or performed during the hospital encounter of 09/28/2019 (from the past 48 hour(s))  CBC with Differential     Status: Abnormal   Collection Time: 10/28/19 12:05 AM  Result Value Ref Range   WBC 31.7 (H) 4.0 - 10.5 K/uL   RBC 4.21 (L) 4.22 - 5.81 MIL/uL   Hemoglobin 11.5 (L) 13.0 - 17.0 g/dL   HCT 36.8 (L) 39.0 - 52.0 %   MCV 87.4 80.0 - 100.0 fL   MCH 27.3 26.0 - 34.0 pg   MCHC 31.3 30.0 - 36.0 g/dL   RDW 15.9 (H) 11.5 - 15.5 %   Platelets 288 150 - 400 K/uL   nRBC 0.0 0.0 - 0.2 %   Neutrophils Relative % 86 %   Neutro Abs 27.3 (H) 1.7 - 7.7 K/uL   Lymphocytes Relative 5 %   Lymphs Abs 1.6 0.7 - 4.0 K/uL   Monocytes Relative 6 %   Monocytes Absolute 2.0 (H) 0.1 - 1.0 K/uL   Eosinophils Relative 1 %   Eosinophils Absolute 0.2 0.0 - 0.5 K/uL   Basophils Relative 0 %   Basophils Absolute 0.1 0.0 - 0.1 K/uL   Immature Granulocytes 2 %   Abs Immature Granulocytes 0.72 (H) 0.00 - 0.07 K/uL   Tear Drop Cells PRESENT    Polychromasia PRESENT     Comment: Performed at [facility], [address] [place], [place] [address]  Comprehensive metabolic panel     Status: Abnormal   Collection Time: 10/28/19 12:05 AM  Result Value Ref Range    Sodium 160 (H) 135 - 145 mmol/L    Comment: REPEATED ELECTROLYTES SRC   Potassium 4.9 3.5 - 5.1 mmol/L   Chloride 124 (H) 98 - 111 mmol/L   CO2 15 (L) 22 - 32 mmol/L   Glucose, Bld 105 (H) 70 - 99 mg/dL   BUN 165 (  H) 8 - 23 mg/dL    Comment: RESULT CONFIRMED BY MANUAL DILUTION [place]   Creatinine, Ser 12.52 (H) 0.61 - 1.24 mg/dL   Calcium 8.9 8.9 - 10.3 mg/dL   Total Protein 8.2 (H) 6.5 - 8.1 g/dL   Albumin 3.2 (L) 3.5 - 5.0 g/dL   AST 41 15 - 41 U/L   ALT 41 0 - 44 U/L   Alkaline Phosphatase 136 (H) 38 - 126 U/L   Total Bilirubin 0.5 0.3 - 1.2 mg/dL   GFR calc non Af Amer 3 (L) >60 mL/min   GFR calc Af Amer 4 (L) >60 mL/min   Anion gap 21 (H) 5 - 15    Comment: Performed at [facility], [address] [place], [place] [address]  Troponin I (High Sensitivity)     Status: Abnormal   Collection Time: 10/28/19 12:05 AM  Result Value Ref Range   Troponin I (High Sensitivity) 144 (HH) <18 ng/L    Comment: CRITICAL RESULT CALLED TO, READ BACK BY AND VERIFIED WITH SILVIA URGILES ON 10/28/19 AT 0101 [place] (NOTE) Elevated high sensitivity troponin I (hsTnI) values and significant  changes across serial measurements may suggest ACS but many other  chronic and acute conditions are known to elevate hsTnI results.  Refer to the "Links" section for chest pain algorithms and additional  guidance. Performed at [facility], [address] [place], [place] [address]   Urinalysis, Complete w Microscopic     Status: Abnormal   Collection Time: 10/28/19 12:05 AM  Result Value Ref Range   Color, Urine YELLOW (A) YELLOW   APPearance TURBID (A) CLEAR   Specific Gravity, Urine 1.014 1.005 - 1.030   pH 5.0 5.0 - 8.0   Glucose, UA NEGATIVE NEGATIVE mg/dL   Hgb urine dipstick MODERATE (A) NEGATIVE   Bilirubin Urine NEGATIVE NEGATIVE   Ketones, ur NEGATIVE NEGATIVE mg/dL   Protein, ur 100 (A) NEGATIVE mg/dL   Nitrite NEGATIVE NEGATIVE   Leukocytes,Ua NEGATIVE NEGATIVE   RBC /  HPF 6-10 0 - 5 RBC/hpf   WBC, UA 21-50 0 - 5 WBC/hpf   Bacteria, UA RARE (A) NONE SEEN   Squamous Epithelial / LPF 6-10 0 - 5   WBC Clumps PRESENT    Mucus PRESENT    Amorphous Crystal PRESENT     Comment: Performed at [facility], [address] [place], [place] [address]  SARS Coronavirus 2 by RT PCR (hospital order, performed in [place] hospital lab) Nasopharyngeal Nasopharyngeal Swab     Status: None   Collection Time: 10/28/19 12:05 AM   Specimen: Nasopharyngeal Swab  Result Value Ref Range   SARS Coronavirus 2 NEGATIVE NEGATIVE    Comment: (NOTE) If result is NEGATIVE SARS-CoV-2 target nucleic acids are NOT DETECTED. The SARS-CoV-2 RNA is generally detectable in upper and lower  respiratory specimens during the acute phase of infection. The lowest  concentration of SARS-CoV-2 viral copies this assay can detect is 250  copies / mL. A negative result does not preclude SARS-CoV-2 infection  and should not be used as the sole basis for treatment or other  patient management decisions.  A negative result may occur with  improper specimen collection / handling, submission of specimen other  than nasopharyngeal swab, presence of viral mutation(s) within the  areas targeted by this assay, and inadequate number of viral copies  (<250 copies / mL). A negative result must be combined with clinical  observations, patient history, and epidemiological  information. If result is POSITIVE SARS-CoV-2 target nucleic acids are DETECTED. The SARS-CoV-2 RNA is generally detectable in upper and lower  respiratory specimens dur ing the acute phase of infection.  Positive  results are indicative of active infection with SARS-CoV-2.  Clinical  correlation with patient history and other diagnostic information is  necessary to determine patient infection status.  Positive results do  not rule out bacterial infection or co-infection with other viruses. If result is PRESUMPTIVE  POSTIVE SARS-CoV-2 nucleic acids MAY BE PRESENT.   A presumptive positive result was obtained on the submitted specimen  and confirmed on repeat testing.  While 2019 novel coronavirus  (SARS-CoV-2) nucleic acids may be present in the submitted sample  additional confirmatory testing may be necessary for epidemiological  and / or clinical management purposes  to differentiate between  SARS-CoV-2 and other Sarbecovirus currently known to infect humans.  If clinically indicated additional testing with an alternate test  methodology [phone]) is advised. The SARS-CoV-2 RNA is generally  detectable in upper and lower respiratory sp ecimens during the acute  phase of infection. The expected result is Negative. Fact Sheet for Patients:  StrictlyIdeas.no Fact Sheet for Healthcare Providers: BankingDealers.co.za This test is not yet approved or cleared by the [place] FDA and has been authorized for detection and/or diagnosis of SARS-CoV-2 by FDA under an Emergency Use Authorization (EUA).  This EUA will remain in effect (meaning this test can be used) for the duration of the COVID-19 declaration under Section 564(b)(1) of the Act, 21 U.S.C. section 360bbb-3(b)(1), unless the authorization is terminated or revoked sooner. Performed at [facility], [address] [place], [place] [address]   Culture, blood (routine x 2)     Status: None (Preliminary result)   Collection Time: 10/28/19 12:57 AM   Specimen: BLOOD  Result Value Ref Range   Specimen Description BLOOD RIGHT ANTECUBITAL    Special Requests      BOTTLES DRAWN AEROBIC AND ANAEROBIC Blood Culture adequate volume   Culture      NO GROWTH < 12 HOURS Performed at [facility], [address], [place], [place] [address]    Report Status PENDING   Lactic acid, plasma     Status: None   Collection Time: 10/28/19 12:57 AM  Result Value Ref Range   Lactic Acid,  Venous 1.9 0.5 - 1.9 mmol/L    Comment: Performed at [facility], [address] [place], [place] [address]  Culture, blood (routine x 2)     Status: None (Preliminary result)   Collection Time: 10/28/19  1:17 AM   Specimen: BLOOD  Result Value Ref Range   Specimen Description BLOOD RIGHT HAND    Special Requests      BOTTLES DRAWN AEROBIC AND ANAEROBIC Blood Culture adequate volume   Culture      NO GROWTH < 12 HOURS Performed at [facility], [address], [place], [place] [address]    Report Status PENDING    Dg Pelvis 1-2 Views  Result Date: 10/28/2019 CLINICAL DATA:  Fall. EXAM: PELVIS - 1-2 VIEW COMPARISON:  None. FINDINGS: There is no evidence of pelvic fracture or diastasis. No pelvic bone lesions are seen. IMPRESSION: Negative. Electronically Signed   By: Constance Holster M.D.   On: 10/28/2019 01:00   Ct Head Wo Contrast  Result Date: 10/02/2019 CLINICAL DATA:  Altered mental status, concern for stroke EXAM: CT HEAD WITHOUT CONTRAST TECHNIQUE: Contiguous axial images were obtained from the base  of the skull through the vertex without intravenous contrast. COMPARISON:  Radiograph August 16, 2018 FINDINGS: Brain: Stable appearance of regional gliosis in the left frontal lobe, the left parieto-occipital region, and in the left periventricular white matter. Additional lacunar type infarcts are noted in the right basal ganglia and thalamus. No convincing evidence of acute infarction, hemorrhage, hydrocephalus, extra-axial collection or mass lesion/mass effect. Symmetric prominence of the ventricles, cisterns and sulci compatible with parenchymal volume loss. Patchy areas of white matter hypoattenuation are most compatible with chronic microvascular angiopathy. Vascular: Atherosclerotic calcification of the carotid siphons and intradural vertebral arteries. No hyperdense vessel. Skull: No calvarial fracture or suspicious osseous lesion. No scalp swelling or  hematoma. Sinuses/Orbits: Paranasal sinuses and mastoid air cells are predominantly clear. Included orbital structures are unremarkable. Other: Absence of the maxillary dentition. IMPRESSION: 1. No acute intracranial abnormality. If there is persisting clinical concern for acute ischemia, MRI would be more sensitive and specific particularly given the background of remote regional gliosis throughout the left cerebral hemisphere and multiple remote lacunar infarcts in the right basal ganglia and thalamus. 2. Stable parenchymal volume loss and chronic microvascular ischemic white matter disease. 3. Intracranial atherosclerosis. Electronically Signed   By: Lovena Le M.D.   On: 10/06/2019 23:52   Dg Chest Port 1 View  Result Date: 10/28/2019 CLINICAL DATA:  Pain status post fall EXAM: PORTABLE CHEST 1 VIEW COMPARISON:  08/16/2018 FINDINGS: The heart size is stable from prior study. There are chronic bronchitic changes at the lung bases bilaterally with scarring versus atelectasis at the right lung base. There is no pneumothorax. The lungs appear somewhat hyperexpanded. IMPRESSION: 1. Persistent hazy airspace opacity at the right lung base. This may represent atelectasis, aspiration, or pneumonia. 2. Stable mild cardiac enlargement. Electronically Signed   By: Constance Holster M.D.   On: 10/28/2019 01:02    Review of Systems  Unable to perform ROS: Dementia    Blood pressure (!) 143/70, pulse 99, temperature 97.8 F (36.6 C), temperature source Axillary, resp. rate 13, height 5\' 10"  (1.778 m), weight 63.5 kg, SpO2 93 %. Physical Exam  Vitals reviewed. Constitutional: He appears well-developed and well-nourished. No distress.  HENT:  Head: Normocephalic and atraumatic.  Mouth/Throat: Oropharynx is clear and moist.  Eyes: Pupils are equal, round, and reactive to light. Conjunctivae and EOM are normal. No scleral icterus.  Neck: Normal range of motion. Neck supple. No JVD present. No tracheal  deviation present. No thyromegaly present.  Cardiovascular: Normal rate, regular rhythm and normal heart sounds. Exam reveals no gallop and no friction rub.  No murmur heard. Respiratory: Effort normal and breath sounds normal. No respiratory distress. He has no wheezes.  GI: Soft. Bowel sounds are normal. He exhibits no distension.  Genitourinary:    Genitourinary Comments: Deferred   Musculoskeletal: Normal range of motion.        General: No edema.  Lymphadenopathy:    He has no cervical adenopathy.  Neurological: He is alert.  The patient does not appear to be oriented to person place or situation  Skin: Skin is warm and dry. No rash noted. No erythema.  Psychiatric:  Difficult to assess Mini-Mental status as the patient has dementia and does not have meaningful interaction with examiner today.  Notably, he had been verbal and interactive 2 days ago with other medical personnel per report.     Assessment/Plan This is a 78 year old male admitted for sepsis. 1.  Sepsis: The patient meets criteria via leukocytosis, intermittent tachycardia and  tachypnea.  He is hemodynamically stable.  Source appears to be pneumonia.  Continue broad-spectrum antibiotics.  Follow blood cultures for growth and sensitivities. 2.  HCAP: Continue vancomycin, cefepime.  The patient does not have an oxygen requirement.  Monitor respiratory status. 3.  ESRD: GFR 3; consult nephrology.  The patient is already on hospice care.  Also consult palliative care team. 4.  Cerebrovascular disease: The patient has a history of 2 known strokes.  Per his wife, he was in dramatically better health approximately 2 weeks ago.  Possible concern for new infarct which may have led to aspiration.  Continue Plavix. 5.  Dementia: Continue Namenda, Remeron and Seroquel.  Also continue Wellbutrin. 6.  DVT prophylaxis: Heparin 7.  GI prophylaxis: Pantoprazole The patient is a DNR.  Time spent on admission orders and patient care  approximately 45 minutes  Harrie Foreman, MD 10/28/2019, 8:01 AM

## 2019-10-28 NOTE — ED Notes (Signed)
Pt's IV stopped working unable to flush unable to administer antibiotics. Placed IV team order to get IV access

## 2019-10-28 NOTE — ED Notes (Signed)
Pt brief changed and pt repositioned in bed.

## 2019-10-28 NOTE — ED Notes (Addendum)
When giving pt his morning medications he stated he did not want to take any more and that he wanted to sleep- pt wife states to let him sleep and not to wake him up for medications

## 2019-10-28 NOTE — ED Notes (Signed)
Per previous RN at shift change, 7p, Pts family refused by mouth medication. RN reported that she attempted to give oral meds and pt was not able to tolerate. Admitting informed by message.

## 2019-10-28 NOTE — ED Notes (Signed)
Pt medications crushed and given in applesauce- applesauce must be given in small amounts or pt begins to choke on it- pt initially was having difficulty swallowing water d/t dry throat- after several sips, pt was able to swallow water better

## 2019-10-28 NOTE — Progress Notes (Addendum)
[facility] [place] NAME: Glenn [name]    MR#:  [id number]  DATE OF BIRTH:  1941-08-06  SUBJECTIVE:  CHIEF COMPLAINT:   Chief Complaint  Patient presents with  . Altered Mental Status  Patient seen and evaluated by me today Confused not oriented to time place and person Moves extremities Family was at bedside CT head reviewed which,Shows old infarcts but no acute abnormality  X-ray pelvis no fracture REVIEW OF SYSTEMS:    ROS  Could not be obtained as patient is altered and confused  DRUG ALLERGIES:  No Known Allergies  VITALS:  Blood pressure (!) 143/70, pulse 99, temperature 97.8 F (36.6 C), temperature source Axillary, resp. rate 13, height 5\' 10"  (1.778 m), weight 63.5 kg, SpO2 93 %.  PHYSICAL EXAMINATION:   Physical Exam  GENERAL:  78 y.o.-year-old patient lying in the bed  EYES: Pupils equal, round, reactive to light and accommodation. No scleral icterus. Extraocular muscles intact.  HEENT: Head atraumatic, normocephalic. Oropharynx dry and nasopharynx clear.  NECK:  Supple, no jugular venous distention. No thyroid enlargement, no tenderness.  LUNGS: Normal breath sounds bilaterally, no wheezing, rales, rhonchi. No use of accessory muscles of respiration.  CARDIOVASCULAR: S1, S2 tachycardia noted. No murmurs, rubs, or gallops.  ABDOMEN: Soft, nontender, nondistended. Bowel sounds present. No organomegaly or mass.  EXTREMITIES: No cyanosis, clubbing or edema b/l.    NEUROLOGIC: Arousable to loud verbal commands and painful stimuli but not oriented to time place and person, moves extremities PSYCHIATRIC: Could not be assessed SKIN: No obvious rash, lesion, or ulcer.   LABORATORY PANEL:   CBC Recent Labs  Lab 10/28/19 0005  WBC 31.7*  HGB 11.5*  HCT 36.8*  PLT 288   ------------------------------------------------------------------------------------------------------------------ Chemistries  Recent Labs  Lab  10/28/19 0005  NA 160*  K 4.9  CL 124*  CO2 15*  GLUCOSE 105*  BUN 165*  CREATININE 12.52*  CALCIUM 8.9  AST 41  ALT 41  ALKPHOS 136*  BILITOT 0.5   ------------------------------------------------------------------------------------------------------------------  Cardiac Enzymes No results for input(s): TROPONINI in the last 168 hours. ------------------------------------------------------------------------------------------------------------------  RADIOLOGY:  Dg Pelvis 1-2 Views  Result Date: 10/28/2019 CLINICAL DATA:  Fall. EXAM: PELVIS - 1-2 VIEW COMPARISON:  None. FINDINGS: There is no evidence of pelvic fracture or diastasis. No pelvic bone lesions are seen. IMPRESSION: Negative. Electronically Signed   By: Constance Holster M.D.   On: 10/28/2019 01:00   Ct Head Wo Contrast  Result Date: 10/21/2019 CLINICAL DATA:  Altered mental status, concern for stroke EXAM: CT HEAD WITHOUT CONTRAST TECHNIQUE: Contiguous axial images were obtained from the base of the skull through the vertex without intravenous contrast. COMPARISON:  Radiograph August 16, 2018 FINDINGS: Brain: Stable appearance of regional gliosis in the left frontal lobe, the left parieto-occipital region, and in the left periventricular white matter. Additional lacunar type infarcts are noted in the right basal ganglia and thalamus. No convincing evidence of acute infarction, hemorrhage, hydrocephalus, extra-axial collection or mass lesion/mass effect. Symmetric prominence of the ventricles, cisterns and sulci compatible with parenchymal volume loss. Patchy areas of white matter hypoattenuation are most compatible with chronic microvascular angiopathy. Vascular: Atherosclerotic calcification of the carotid siphons and intradural vertebral arteries. No hyperdense vessel. Skull: No calvarial fracture or suspicious osseous lesion. No scalp swelling or hematoma. Sinuses/Orbits: Paranasal sinuses and mastoid air cells are  predominantly clear. Included orbital structures are unremarkable. Other: Absence of the maxillary dentition. IMPRESSION: 1. No acute intracranial abnormality. If  there is persisting clinical concern for acute ischemia, MRI would be more sensitive and specific particularly given the background of remote regional gliosis throughout the left cerebral hemisphere and multiple remote lacunar infarcts in the right basal ganglia and thalamus. 2. Stable parenchymal volume loss and chronic microvascular ischemic white matter disease. 3. Intracranial atherosclerosis. Electronically Signed   By: Lovena Le M.D.   On: 10/15/2019 23:52   Dg Chest Port 1 View  Result Date: 10/28/2019 CLINICAL DATA:  Pain status post fall EXAM: PORTABLE CHEST 1 VIEW COMPARISON:  08/16/2018 FINDINGS: The heart size is stable from prior study. There are chronic bronchitic changes at the lung bases bilaterally with scarring versus atelectasis at the right lung base. There is no pneumothorax. The lungs appear somewhat hyperexpanded. IMPRESSION: 1. Persistent hazy airspace opacity at the right lung base. This may represent atelectasis, aspiration, or pneumonia. 2. Stable mild cardiac enlargement. Electronically Signed   By: Constance Holster M.D.   On: 10/28/2019 01:02     ASSESSMENT AND PLAN:   78 year old elderly male patient with history of COPD, GERD, hypertension, hyperlipidemia, hypogonadism, CVA, chronic kidney disease stage IV resident of [facility] currently under hospitalist service  -Acute metabolic encephalopathy Secondary to hyper natremia and pneumonia IV fluid hydration Follow-up sodium levels  -Healthcare associated pneumonia Continue IV vancomycin and cefepime antibiotics Follow-up cultures  -Sepsis secondary to pneumonia IV fluids Broad-spectrum antibiotics Follow-up cultures and procalcitonin levels Follow-up lactic acid level  -Acute hypernatremia IV fluid hydration and follow-up sodium  levels  -Dementia advanced Continue Namenda Remeron and Seroquel if orally tolerated  -History of CVA Continue oral Plavix CT head does not show any new CVA  -Acute kidney injury on CKD stage IV Nephrology consult Avoid nephrotoxic meds  -DVT prophylaxis subcu heparin  -Elevated troponin Could be from demand ischemia from sepsis  -Palliative care consult  All the records are reviewed and case discussed with Care Management/Social Worker. Management plans discussed with the patient, family and they are in agreement.  CODE STATUS: Full code  DVT Prophylaxis: SCDs  TOTAL TIME TAKING CARE OF THIS PATIENT: 45 minutes.   POSSIBLE D/C IN 2 to 3 DAYS, DEPENDING ON CLINICAL CONDITION.  Saundra Shelling M.D on 10/28/2019 at 9:41 AM  Between 7am to 6pm - Pager - [phone]  After 6pm go to [email or web address] - password [email or web address] [facility]  Office  [phone]  CC: Primary care physician; [employer], MD  Note: This dictation was prepared with Dragon dictation along with smaller phrase technology. Any transcriptional errors that result from this process are unintentional.

## 2019-10-28 NOTE — ED Notes (Addendum)
Brief changed and pt repositioned by this tech and Systems developer

## 2019-10-28 NOTE — ED Notes (Signed)
Pt's wife reports hospice and nursing home are not doing anything for her husband, pt's spouse reports pt is in hospice" for his safety , because has kidney failure stage 5, and COPD" pt's spouse upset with Hospice and nursing facility "they are in favor of each other" Pt's spouse reports she demanded pt to be seen in ER because "he is not like himself" pt has history of dementia. Per pt's spouse pt usually speaks clear in complete sentences

## 2019-10-28 NOTE — ED Notes (Signed)
Pt brief changed and pt positioned for comfort in bed

## 2019-10-28 NOTE — Progress Notes (Signed)
PHARMACY -  BRIEF ANTIBIOTIC NOTE   Pharmacy has received consult(s) for Vancomycin and Cefepime from an ED provider.  The patient's profile has been reviewed for ht/wt/allergies/indication/available labs.    One time order(s) placed for Vancomycin 1000mg  and Cefepime 2000mg   Further antibiotics/pharmacy consults should be ordered by admitting physician if indicated.                       Thank you, Hart Robinsons A 10/28/2019  1:33 AM

## 2019-10-28 NOTE — ED Notes (Signed)
Pt continues to desat to 88% percent when sleeping but comes back up to 93%

## 2019-10-28 NOTE — ED Notes (Signed)
Bladder scan 59ml, Dr. Beather Arbour made aware

## 2019-10-28 NOTE — Progress Notes (Signed)
   10/28/19 1100  Clinical Encounter Type  Visited With Patient and family together  Visit Type Initial;Psychological support  Referral From Nurse  Spiritual Encounters  Spiritual Needs Emotional  Stress Factors  Patient Stress Factors Lack of caregivers;Lack of knowledge  Family Stress Factors Health changes  Ch received a page for emotional support. Upon arrival, wife greeted the ch while the pt was resting in bed. Pt's wife Otila Kluver shared her grievances and concerns regarding the treatment that the pt received in nursing home and with Hospice service. Wife is very upset that the nursing home and Hospice did not treat the pt well and that his conditioned worsened significantly ever since he went to nursing home. Wife is taking actions to address the issue with the nursing home. She has been up all night and eaten very much. Ch encouraged her to take care of herself and also offered a listening ear. Ch will follow up later when the pt gets moved on the unit.

## 2019-10-28 NOTE — Progress Notes (Signed)
Advanced care plan.  Purpose of the Encounter: CODE STATUS  Parties in Attendance: Patient and family  Patient's Decision Capacity: Not good  Subjective/Patient's story: The patient with past medical history of chronic kidney disease, recent as well as remote stroke, hypertension and hyperlipidemia presents to the emergency department from his nursing home due to altered mental status.  The patient's wife reports that he has had a muted speaking voice for more than a week.  The patient also has not been eating well, per nursing home staff.  Upon admission to the nursing home 4 weeks ago the patient could converse in complete sentences and could not walk.  (He was actually admitted to the nursing home for increased falls at home).  His wife reports that he has had some undocumented falls in the nursing home and that now his mental status and respiratory status have worsened.  Chest x-ray in the emergency department revealed hazy opacity in the right lung base concerning for pneumonia.  The patient was started on broad-spectrum antibiotics after obtaining blood cultures.  The hospital service was then called for admission.   Objective/Medical story Patient is dry and dehydrated Has pneumonia and sepsis Needs IV fluids and broad-spectrum antibiotics   Goals of care determination:  Advance care directives goals of care treatment plan discussed with patient's family Did not want CPR, intubation ventilator if the need arises Patient is DNR by CODE STATUS   CODE STATUS: DNR   Time spent discussing advanced care planning: 16 minutes

## 2019-10-28 NOTE — Progress Notes (Signed)
CODE SEPSIS - PHARMACY COMMUNICATION  **Broad Spectrum Antibiotics should be administered within 1 hour of Sepsis diagnosis**  Time Code Sepsis Called/Page Received: 0126  Antibiotics Ordered: Vancomycin and Cefepime  Time of 1st antibiotic administration: 0240  Additional action taken by pharmacy: f/u w/ RN  If necessary, Name of Provider/Nurse Contacted: Per RN, Pt difficult stick unable to start antibiotics IV team consult for IV access    Ena Dawley ,PharmD Clinical Pharmacist  10/28/2019  1:32 AM

## 2019-10-28 NOTE — ED Notes (Signed)
Pt difficult stick unable to start antibiotics IV team consult for IV access

## 2019-10-29 ENCOUNTER — Encounter: Payer: Self-pay | Admitting: Student

## 2019-10-29 ENCOUNTER — Inpatient Hospital Stay

## 2019-10-29 ENCOUNTER — Other Ambulatory Visit: Payer: Self-pay

## 2019-10-29 DIAGNOSIS — A419 Sepsis, unspecified organism: Secondary | ICD-10-CM

## 2019-10-29 DIAGNOSIS — J189 Pneumonia, unspecified organism: Secondary | ICD-10-CM

## 2019-10-29 DIAGNOSIS — R4182 Altered mental status, unspecified: Secondary | ICD-10-CM

## 2019-10-29 DIAGNOSIS — R0602 Shortness of breath: Secondary | ICD-10-CM

## 2019-10-29 DIAGNOSIS — R778 Other specified abnormalities of plasma proteins: Secondary | ICD-10-CM

## 2019-10-29 LAB — CBC
HCT: 33.3 % — ABNORMAL LOW (ref 39.0–52.0)
Hemoglobin: 10.1 g/dL — ABNORMAL LOW (ref 13.0–17.0)
MCH: 27.2 pg (ref 26.0–34.0)
MCHC: 30.3 g/dL (ref 30.0–36.0)
MCV: 89.5 fL (ref 80.0–100.0)
Platelets: 267 10*3/uL (ref 150–400)
RBC: 3.72 MIL/uL — ABNORMAL LOW (ref 4.22–5.81)
RDW: 16.8 % — ABNORMAL HIGH (ref 11.5–15.5)
WBC: 29.6 10*3/uL — ABNORMAL HIGH (ref 4.0–10.5)
nRBC: 0 % (ref 0.0–0.2)

## 2019-10-29 LAB — BASIC METABOLIC PANEL
Anion gap: 16 — ABNORMAL HIGH (ref 5–15)
BUN: 159 mg/dL — ABNORMAL HIGH (ref 8–23)
BUN: 161 mg/dL — ABNORMAL HIGH (ref 8–23)
CO2: 16 mmol/L — ABNORMAL LOW (ref 22–32)
CO2: 17 mmol/L — ABNORMAL LOW (ref 22–32)
Calcium: 8.1 mg/dL — ABNORMAL LOW (ref 8.9–10.3)
Calcium: 8.1 mg/dL — ABNORMAL LOW (ref 8.9–10.3)
Chloride: 128 mmol/L — ABNORMAL HIGH (ref 98–111)
Chloride: >130 mmol/L (ref 98–111)
Creatinine, Ser: 11.91 mg/dL — ABNORMAL HIGH (ref 0.61–1.24)
Creatinine, Ser: 12.28 mg/dL — ABNORMAL HIGH (ref 0.61–1.24)
GFR calc Af Amer: 4 mL/min — ABNORMAL LOW (ref >60)
GFR calc Af Amer: 4 mL/min — ABNORMAL LOW (ref >60)
GFR calc non Af Amer: 4 mL/min — ABNORMAL LOW (ref >60)
GFR calc non Af Amer: 3 mL/min — ABNORMAL LOW (ref >60)
Glucose, Bld: 146 mg/dL — ABNORMAL HIGH (ref 70–99)
Glucose, Bld: 161 mg/dL — ABNORMAL HIGH (ref 70–99)
Potassium: 4.8 mmol/L (ref 3.5–5.1)
Potassium: 4.5 mmol/L (ref 3.5–5.1)
Sodium: 160 mmol/L — ABNORMAL HIGH (ref 135–145)
Sodium: 156 mmol/L — ABNORMAL HIGH (ref 135–145)

## 2019-10-29 LAB — BRAIN NATRIURETIC PEPTIDE: B Natriuretic Peptide: 440 pg/mL — ABNORMAL HIGH (ref 0.0–100.0)

## 2019-10-29 LAB — TROPONIN I (HIGH SENSITIVITY)
Troponin I (High Sensitivity): 115 ng/L (ref <18)
Troponin I (High Sensitivity): 118 ng/L (ref <18)

## 2019-10-29 LAB — URINE CULTURE
Culture: NO GROWTH
Special Requests: NORMAL

## 2019-10-29 LAB — MRSA PCR SCREENING: MRSA by PCR: POSITIVE — AB

## 2019-10-29 MED ORDER — ORAL CARE MOUTH RINSE: 15.0000 mL | Freq: Two times a day (BID) | OROMUCOSAL | Status: DC

## 2019-10-29 MED ORDER — CHLORHEXIDINE GLUCONATE 0.12 % MT SOLN
15.0000 mL | Freq: Two times a day (BID) | OROMUCOSAL | Status: DC
  Administered 2019-10-29: 15 mL via OROMUCOSAL
  Filled 2019-10-29: qty 15

## 2019-10-29 MED ORDER — DEXTROSE 5 % IV SOLN
INTRAVENOUS | Status: DC
  Administered 2019-10-29 – 2019-10-30 (×2): via INTRAVENOUS

## 2019-10-29 MED ORDER — MORPHINE SULFATE (PF) 2 MG/ML IV SOLN
1.0000 mg | INTRAVENOUS | Status: DC | PRN
  Administered 2019-10-29: 2 mg via INTRAVENOUS
  Filled 2019-10-29: qty 1

## 2019-10-29 NOTE — ED Notes (Signed)
Spoke to [facility]) about pt presentation. Per [facility], pt is appropriate to go to med-surg on NRB at this time. Pt no longer agitated after morphine given at 1641. Spouse at bedside, updated on plan of care.

## 2019-10-29 NOTE — ED Notes (Signed)
This RN placed condom catheter on pt. This RN and Elmo Putt, RN cleaned pt, changed the sheets and repositioned pt in bed.

## 2019-10-29 NOTE — ED Notes (Signed)
Pt resting comfortably at this time.

## 2019-10-29 NOTE — ED Notes (Signed)
Mittens placed on pt at this time. Pt appears uncomfortable in bed. Brief changed and peri-care performed by this rn and myah, NT. Pt repositoned in bed. This RN will continue to monitor.

## 2019-10-29 NOTE — ED Notes (Signed)
Attempted to feed pt breakfast. D/t confusion/agitation, pt is unable to take anything by mouth at this time. Wife aware. Will notify attending MD during rounds.

## 2019-10-29 NOTE — ED Notes (Signed)
Patient seems very agitated and uncomfortable. Given med for high PAIDAD. Patient has snoring respirations, sounds very dry. Will contact admitting MD and let them know. Unsure what patient's baseline for this admission is

## 2019-10-29 NOTE — ED Notes (Signed)
Pt's wife at bedside.

## 2019-10-29 NOTE — Progress Notes (Signed)
Rosana Hoes, NP acknowledged; no new orders. Barbaraann Faster, RN 10:52 PM 10/29/2019

## 2019-10-29 NOTE — Progress Notes (Signed)
Merlene Laughter, NP for Hospitalists notified by secure chat of elevated Troponin of 118 from 115; awaiting acknowledgement and/or orders. Barbaraann Faster, RN 10:50 PM; 10/29/2019

## 2019-10-29 NOTE — ED Notes (Signed)
Pt with load, labored breathing. Sat 87%. Pt removed n/c and had legs swung over side of bed. Mittens replaced on pt, pt attempting to hit and kick nurse. O2 replaced and pt repositioned in bed. Pt has already had morphine. O2 increased to 6L.
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
## 2019-10-29 NOTE — ED Notes (Signed)
Patient's mouth is very dry and cracked. Swabbed mouth and applied mouth moisturizer.  Overnight MD ordering chest xray to evaluate possible increased SOB

## 2019-10-29 NOTE — ED Notes (Signed)
Unable to get his labwork off IV. Lab to get blood when gets to floor.
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
## 2019-10-29 NOTE — ED Notes (Signed)
Patient resting, bed alarm placed on medium setting

## 2019-10-29 NOTE — ED Notes (Signed)
Pt placed on 15L NRB mask d/t sat on 6L 90%. Sat now 99%, WOB appears improved on NRB.
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
## 2019-10-29 NOTE — ED Notes (Signed)
Placed condom catheter on patient, repositioned for comfort

## 2019-10-29 DEATH — deceased

## 2019-11-02 LAB — CULTURE, BLOOD (ROUTINE X 2)
Culture: NO GROWTH
Culture: NO GROWTH
Special Requests: ADEQUATE
Special Requests: ADEQUATE

## 2019-11-28 NOTE — Progress Notes (Signed)
10/31/2019  0831  Contacted [employer] officer Maryelizabeth Rowan [phone]. He stated was okay to release patient's body to funeral home. Called  Patient's wife to confirm she still wanted to release to [facility]. Called  [phone], no answer, no voicemail. Called  [phone]. Left HIPPA compliant message asking Mrs. Osborne to call me back.

## 2019-11-28 NOTE — Death Summary Note (Signed)
Death Summary  Glenn [name] [id number] DOB: 1941-05-11 DOA: 11/10/2019  PCP: Redmond School, MD  Admit date: 11/23/2019 Date of Death: 2019/11/02 Time of Death: 5 AM Notification: Redmond School, MD notified of death of 11/02/19   History of present illness:  Glenn [name] is a 78 y.o. male with a history of  CKD, CVA, dementia and hypertension . Glenn [name] presented with complaint of worsening shortness of breath and altered mental status.  He was found to have hyponatremia, acute respiratory failure and AKI. Glenn [name] did not improve after giving him fluids and antibiotics for possible infection although no source of infection found.  He continues to experience difficulty breathing and requiring higher levels of oxygen.  Patient started agonal breathing overnight which does not respond to any intervention and diet around 5 AM.  Final Diagnoses:  1.  Hypernatremia 2.  Acute renal failure. 3.  Acute respiratory failure.   The results of significant diagnostics from this hospitalization (including imaging, microbiology, ancillary and laboratory) are listed below for reference.    Significant Diagnostic Studies: Dg Pelvis 1-2 Views  Result Date: 10/28/2019 CLINICAL DATA:  Fall. EXAM: PELVIS - 1-2 VIEW COMPARISON:  None. FINDINGS: There is no evidence of pelvic fracture or diastasis. No pelvic bone lesions are seen. IMPRESSION: Negative. Electronically Signed   By: Constance Holster M.D.   On: 10/28/2019 01:00   Ct Head Wo Contrast  Result Date: 11/15/2019 CLINICAL DATA:  Altered mental status, concern for stroke EXAM: CT HEAD WITHOUT CONTRAST TECHNIQUE: Contiguous axial images were obtained from the base of the skull through the vertex without intravenous contrast. COMPARISON:  Radiograph August 16, 2018 FINDINGS: Brain: Stable appearance of regional gliosis in the left frontal lobe, the left parieto-occipital region, and in the left periventricular white  matter. Additional lacunar type infarcts are noted in the right basal ganglia and thalamus. No convincing evidence of acute infarction, hemorrhage, hydrocephalus, extra-axial collection or mass lesion/mass effect. Symmetric prominence of the ventricles, cisterns and sulci compatible with parenchymal volume loss. Patchy areas of white matter hypoattenuation are most compatible with chronic microvascular angiopathy. Vascular: Atherosclerotic calcification of the carotid siphons and intradural vertebral arteries. No hyperdense vessel. Skull: No calvarial fracture or suspicious osseous lesion. No scalp swelling or hematoma. Sinuses/Orbits: Paranasal sinuses and mastoid air cells are predominantly clear. Included orbital structures are unremarkable. Other: Absence of the maxillary dentition. IMPRESSION: 1. No acute intracranial abnormality. If there is persisting clinical concern for acute ischemia, MRI would be more sensitive and specific particularly given the background of remote regional gliosis throughout the left cerebral hemisphere and multiple remote lacunar infarcts in the right basal ganglia and thalamus. 2. Stable parenchymal volume loss and chronic microvascular ischemic white matter disease. 3. Intracranial atherosclerosis. Electronically Signed   By: Lovena Le M.D.   On: 11/18/2019 23:52   Dg Chest Port 1 View  Result Date: 10/29/2019 CLINICAL DATA:  Shortness of breath. Ex-smoker. EXAM: PORTABLE CHEST 1 VIEW COMPARISON:  10/28/2019 FINDINGS: Normal sized heart. Tortuous aorta. Clear lungs. Normal vascularity. Mild scoliosis. IMPRESSION: No acute abnormality. Electronically Signed   By: Claudie Revering M.D.   On: 10/29/2019 07:13   Dg Chest Port 1 View  Result Date: 10/28/2019 CLINICAL DATA:  Pain status post fall EXAM: PORTABLE CHEST 1 VIEW COMPARISON:  08/16/2018 FINDINGS: The heart size is stable from prior study. There are chronic bronchitic changes at the lung bases bilaterally with scarring  versus atelectasis at the right lung base. There  is no pneumothorax. The lungs appear somewhat hyperexpanded. IMPRESSION: 1. Persistent hazy airspace opacity at the right lung base. This may represent atelectasis, aspiration, or pneumonia. 2. Stable mild cardiac enlargement. Electronically Signed   By: Constance Holster M.D.   On: 10/28/2019 01:02    Microbiology: Recent Results (from the past 240 hour(s))  SARS Coronavirus 2 by RT PCR (hospital order, performed in [facility] hospital lab) Nasopharyngeal Nasopharyngeal Swab     Status: None   Collection Time: 10/28/19 12:05 AM   Specimen: Nasopharyngeal Swab  Result Value Ref Range Status   SARS Coronavirus 2 NEGATIVE NEGATIVE Final    Comment: (NOTE) If result is NEGATIVE SARS-CoV-2 target nucleic acids are NOT DETECTED. The SARS-CoV-2 RNA is generally detectable in upper and lower  respiratory specimens during the acute phase of infection. The lowest  concentration of SARS-CoV-2 viral copies this assay can detect is 250  copies / mL. A negative result does not preclude SARS-CoV-2 infection  and should not be used as the sole basis for treatment or other  patient management decisions.  A negative result may occur with  improper specimen collection / handling, submission of specimen other  than nasopharyngeal swab, presence of viral mutation(s) within the  areas targeted by this assay, and inadequate number of viral copies  (<250 copies / mL). A negative result must be combined with clinical  observations, patient history, and epidemiological information. If result is POSITIVE SARS-CoV-2 target nucleic acids are DETECTED. The SARS-CoV-2 RNA is generally detectable in upper and lower  respiratory specimens dur ing the acute phase of infection.  Positive  results are indicative of active infection with SARS-CoV-2.  Clinical  correlation with patient history and other diagnostic information is  necessary to determine patient infection  status.  Positive results do  not rule out bacterial infection or co-infection with other viruses. If result is PRESUMPTIVE POSTIVE SARS-CoV-2 nucleic acids MAY BE PRESENT.   A presumptive positive result was obtained on the submitted specimen  and confirmed on repeat testing.  While 2019 novel coronavirus  (SARS-CoV-2) nucleic acids may be present in the submitted sample  additional confirmatory testing may be necessary for epidemiological  and / or clinical management purposes  to differentiate between  SARS-CoV-2 and other Sarbecovirus currently known to infect humans.  If clinically indicated additional testing with an alternate test  methodology [phone]) is advised. The SARS-CoV-2 RNA is generally  detectable in upper and lower respiratory sp ecimens during the acute  phase of infection. The expected result is Negative. Fact Sheet for Patients:  StrictlyIdeas.no Fact Sheet for Healthcare Providers: BankingDealers.co.za This test is not yet approved or cleared by the [place] FDA and has been authorized for detection and/or diagnosis of SARS-CoV-2 by FDA under an Emergency Use Authorization (EUA).  This EUA will remain in effect (meaning this test can be used) for the duration of the COVID-19 declaration under Section 564(b)(1) of the Act, 21 U.S.C. section 360bbb-3(b)(1), unless the authorization is terminated or revoked sooner. Performed at [facility], [address], [place], [place] [address]   Urine culture     Status: None   Collection Time: 10/28/19 12:05 AM   Specimen: Urine, Random  Result Value Ref Range Status   Specimen Description   Final    URINE, RANDOM Performed at [facility], [address], [place], [place] [address]    Special Requests   Final    Normal Performed at [facility], [place]  [address] [place], [place] [address]    Culture   Final    NO GROWTH Performed at  [facility], [place] [address] [place], [place] [address]    Report Status 10/29/2019 FINAL  Final  Culture, blood (routine x 2)     Status: None (Preliminary result)   Collection Time: 10/28/19 12:57 AM   Specimen: BLOOD  Result Value Ref Range Status   Specimen Description BLOOD RIGHT ANTECUBITAL  Final   Special Requests   Final    BOTTLES DRAWN AEROBIC AND ANAEROBIC Blood Culture adequate volume   Culture   Final    NO GROWTH 2 DAYS Performed at [facility], [address], [place], [place] [address]    Report Status PENDING  Incomplete  Culture, blood (routine x 2)     Status: None (Preliminary result)   Collection Time: 10/28/19  1:17 AM   Specimen: BLOOD  Result Value Ref Range Status   Specimen Description BLOOD RIGHT HAND  Final   Special Requests   Final    BOTTLES DRAWN AEROBIC AND ANAEROBIC Blood Culture adequate volume   Culture   Final    NO GROWTH 2 DAYS Performed at [facility], [address] [place], [place] [address]    Report Status PENDING  Incomplete  MRSA PCR Screening     Status: Abnormal   Collection Time: 10/29/19 11:47 AM   Specimen: Nasal Mucosa; Nasopharyngeal  Result Value Ref Range Status   MRSA by PCR POSITIVE (A) NEGATIVE Final    Comment:        The GeneXpert MRSA Assay (FDA approved for NASAL specimens only), is one component of a comprehensive MRSA colonization surveillance program. It is not intended to diagnose MRSA infection nor to guide or monitor treatment for MRSA infections. RESULT CALLED TO, READ BACK BY AND VERIFIED WITH:  [facility] NEEDHAM AT [id number] 10/29/2019 SDR Performed at [facility], [address] [place], [place] [address]      Labs: Basic Metabolic Panel: Recent Labs  Lab 10/28/19 0005 10/28/19 1340 10/28/19 1947 10/29/19 0559 10/29/19 1744  NA 160* 159* 158* 160* 156*  K 4.9  --   --  4.8 4.5  CL 124*  --   --  128* >130*  CO2 15*  --   --  16* 17*  GLUCOSE 105*  --    --  146* 161*  BUN 165*  --   --  159* 161*  CREATININE 12.52*  --   --  11.91* 12.28*  CALCIUM 8.9  --   --  8.1* 8.1*   Liver Function Tests: Recent Labs  Lab 10/28/19 0005  AST 41  ALT 41  ALKPHOS 136*  BILITOT 0.5  PROT 8.2*  ALBUMIN 3.2*   No results for input(s): LIPASE, AMYLASE in the last 168 hours. No results for input(s): AMMONIA in the last 168 hours. CBC: Recent Labs  Lab 10/28/19 0005 10/29/19 0559  WBC 31.7* 29.6*  NEUTROABS 27.3*  --   HGB 11.5* 10.1*  HCT 36.8* 33.3*  MCV 87.4 89.5  PLT 288 267   Cardiac Enzymes: No results for input(s): CKTOTAL, CKMB, CKMBINDEX, TROPONINI in the last 168 hours. D-Dimer No results for input(s): DDIMER in the last 72 hours. BNP: Invalid input(s): POCBNP CBG: No results for input(s): GLUCAP in the last 168 hours. Anemia work up No results for input(s): VITAMINB12, FOLATE, FERRITIN, TIBC, IRON, RETICCTPCT in the last 72 hours. Urinalysis    Component  Value Date/Time   COLORURINE YELLOW (A) 10/28/2019 0005   APPEARANCEUR TURBID (A) 10/28/2019 0005   LABSPEC 1.014 10/28/2019 0005   PHURINE 5.0 10/28/2019 0005   GLUCOSEU NEGATIVE 10/28/2019 0005   HGBUR MODERATE (A) 10/28/2019 0005   BILIRUBINUR NEGATIVE 10/28/2019 0005   KETONESUR NEGATIVE 10/28/2019 0005   PROTEINUR 100 (A) 10/28/2019 0005   UROBILINOGEN 0.2 11/03/2013 1213   NITRITE NEGATIVE 10/28/2019 0005   LEUKOCYTESUR NEGATIVE 10/28/2019 0005   Sepsis Labs Invalid input(s): PROCALCITONIN,  WBC,  LACTICIDVEN  SIGNED:  Lorella Nimrod, MD  [facility] 11-21-19, 6:57 AM Pager [phone]  If 7PM-7AM, please contact night-coverage [email or web address] Password [email or web address]
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
## 2019-11-28 NOTE — Progress Notes (Signed)
Jeannene Patella, NP at bedside to confirm death; called at 69.  Dr. Sidney Ace notified also; awaiting for wife to arrive.Barbaraann Faster, RN 5:17 AM Nov 12, 2019

## 2019-11-28 NOTE — Progress Notes (Signed)
Wife notified of patient's change in VS and condition by Jeannene Patella, NP. Barbaraann Faster, RN 1:07 AM 11-21-2019

## 2019-11-28 NOTE — Progress Notes (Signed)
Called by nursing reporting new change in vital signs including telemetry change of bradycardia and worsening agonal breathing. On presentation to bedside patient was seen with no heart sound, no carotid pulse, absent breath sounds, with fixed and dilated pupils.Wife has been notified and is on her way to the hospital. Time of death 0500

## 2019-11-28 NOTE — Progress Notes (Signed)
Glenn Sanfilippo, NP at bedside; agonal breathing "without much air movement"; will contact wife regarding aggressiveness of care. Barbaraann Faster, RN 12:36 AM 2019/11/12

## 2019-11-28 NOTE — Progress Notes (Signed)
Jeannene Patella, NP notified via secure chat of tachycardia 120-126 on telemetry; found mask off patient, replaced O2 sat WNL at this time; acknowledgment will come see patient. Barbaraann Faster, RN; 12:23 AM; 11-14-2019

## 2019-11-28 NOTE — Progress Notes (Signed)
Glenn [name], [facility] contacted regarding MEWS and patient being DNR; MEWS now 4, but aggressive care not pursued per NP and wife. Will verify MEWS vs. DNR; will get back with this RN. Awaiting confirmation. Barbaraann Faster, RN 3:22 AM 11/08/2019

## 2019-11-28 NOTE — Progress Notes (Signed)
Called by nurse regarding change in vital signs, including tachycardia and change in respiratory effort.   S: Lying in bed unresponsive to verbal or physical stimuli.   O: Gasping respirations observed with poor respiratory effort and accessory muscle use.   A: Patient appears to be in early stages of the dying process. With continued renal failure and unresponsiveness. Given concerning signs patients spouse Glenn [name] was notified and updated. She reports she was with patient from 0800-2000 and these described signs do not appear new to her that he "is better than when he first came in". In depth discussion was held regarding patients poor prognosis and observed change in status. Confirmed with spouse that patient is a DNR and that no aggressive measure will be implemented. She voiced understanding and agreed.   P: -Continue supportive care -Continue supplemental oxygen  -PRN opoid's for pain control and air hunger

## 2019-11-28 NOTE — Progress Notes (Signed)
   2019-11-26 0700  Clinical Encounter Type  Visited With Patient;Family;Patient and family together  Visit Type Follow-up;Spiritual support;Death;ED  Referral From Nurse  Spiritual Encounters  Spiritual Needs Emotional;Grief support  Stress Factors  Family Stress Factors Exhausted;Loss;Major life changes;Other (Comment)

## 2019-11-28 NOTE — Progress Notes (Signed)
AC notified that wife OK'd for [facility] to come get patient. [employer] [facility] on [address].  Wife at bedside, waiting for [place]. Refused drink or other comfort measures. Barbaraann Faster, RN 7:39 AM November 09, 2019

## 2019-11-28 NOTE — Progress Notes (Signed)
Wife, son and DIL at bedside; Chaplain talked with wife and gave them some time alone with patient; [facility] notified.  [place] [address] is designated [employer]. Barbaraann Faster, RN 6:17 AM; 11/19/19

## 2019-11-28 NOTE — Progress Notes (Signed)
Glenn Patella, NP and wife notified of decline; wife on her way to hospital. RR zero, BP zero. Barbaraann Faster, RN 5:03 AM ; 11-06-19

## 2019-11-28 DEATH — deceased

## 2019-12-11 ENCOUNTER — Ambulatory Visit: Payer: Medicare Other | Admitting: Neurology
